# Patient Record
Sex: Male | Born: 1945 | Hispanic: No | Marital: Married | Smoking: Former smoker
Health system: Southern US, Community
[De-identification: ages and names within clinical notes are randomized; demographics above are authoritative.]

## PROBLEM LIST (undated history)

## (undated) DIAGNOSIS — L409 Psoriasis, unspecified: Secondary | ICD-10-CM

## (undated) DIAGNOSIS — I10 Essential (primary) hypertension: Secondary | ICD-10-CM

## (undated) DIAGNOSIS — E039 Hypothyroidism, unspecified: Secondary | ICD-10-CM

## (undated) DIAGNOSIS — J45909 Unspecified asthma, uncomplicated: Secondary | ICD-10-CM

## (undated) DIAGNOSIS — E119 Type 2 diabetes mellitus without complications: Secondary | ICD-10-CM

## (undated) DIAGNOSIS — T7840XA Allergy, unspecified, initial encounter: Secondary | ICD-10-CM

## (undated) DIAGNOSIS — J40 Bronchitis, not specified as acute or chronic: Secondary | ICD-10-CM

## (undated) HISTORY — DX: Allergy, unspecified, initial encounter: T78.40XA

## (undated) HISTORY — DX: Psoriasis, unspecified: L40.9

## (undated) HISTORY — DX: Unspecified asthma, uncomplicated: J45.909

## (undated) HISTORY — PX: TRANSURETHRAL RESECTION OF PROSTATE: SHX73

## (undated) HISTORY — DX: Bronchitis, not specified as acute or chronic: J40

## (undated) HISTORY — PX: CORONARY STENT PLACEMENT: SHX1402

---
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
## 2019-10-04 DIAGNOSIS — R69 Illness, unspecified: Secondary | ICD-10-CM | POA: Diagnosis not present

## 2019-10-09 ENCOUNTER — Ambulatory Visit (HOSPITAL_COMMUNITY)
Admission: RE | Admit: 2019-10-09 | Discharge: 2019-10-09 | Disposition: A | Discharge status: Home / Self Care | Payer: Medicare HMO | Source: Ambulatory Visit | Attending: Internal Medicine | Admitting: Internal Medicine

## 2019-10-09 ENCOUNTER — Ambulatory Visit (INDEPENDENT_AMBULATORY_CARE_PROVIDER_SITE_OTHER): Payer: Medicare HMO | Admitting: Internal Medicine

## 2019-10-09 ENCOUNTER — Other Ambulatory Visit: Payer: Self-pay

## 2019-10-09 ENCOUNTER — Encounter: Payer: Self-pay | Admitting: Internal Medicine

## 2019-10-09 VITALS — BP 106/65 | HR 98 | Temp 98.3°F | Ht 66.0 in | Wt 174.7 lb

## 2019-10-09 DIAGNOSIS — Z79899 Other long term (current) drug therapy: Secondary | ICD-10-CM

## 2019-10-09 DIAGNOSIS — Z6828 Body mass index (BMI) 28.0-28.9, adult: Secondary | ICD-10-CM

## 2019-10-09 DIAGNOSIS — Z7902 Long term (current) use of antithrombotics/antiplatelets: Secondary | ICD-10-CM

## 2019-10-09 DIAGNOSIS — I10 Essential (primary) hypertension: Secondary | ICD-10-CM | POA: Insufficient documentation

## 2019-10-09 DIAGNOSIS — J849 Interstitial pulmonary disease, unspecified: Secondary | ICD-10-CM

## 2019-10-09 DIAGNOSIS — I2510 Atherosclerotic heart disease of native coronary artery without angina pectoris: Secondary | ICD-10-CM | POA: Diagnosis not present

## 2019-10-09 DIAGNOSIS — R634 Abnormal weight loss: Secondary | ICD-10-CM

## 2019-10-09 DIAGNOSIS — H9190 Unspecified hearing loss, unspecified ear: Secondary | ICD-10-CM

## 2019-10-09 DIAGNOSIS — E039 Hypothyroidism, unspecified: Secondary | ICD-10-CM

## 2019-10-09 DIAGNOSIS — Z79890 Hormone replacement therapy (postmenopausal): Secondary | ICD-10-CM

## 2019-10-09 DIAGNOSIS — R918 Other nonspecific abnormal finding of lung field: Secondary | ICD-10-CM | POA: Diagnosis not present

## 2019-10-09 DIAGNOSIS — Z9861 Coronary angioplasty status: Secondary | ICD-10-CM

## 2019-10-09 DIAGNOSIS — L308 Other specified dermatitis: Secondary | ICD-10-CM | POA: Diagnosis not present

## 2019-10-09 DIAGNOSIS — Z7984 Long term (current) use of oral hypoglycemic drugs: Secondary | ICD-10-CM

## 2019-10-09 DIAGNOSIS — E119 Type 2 diabetes mellitus without complications: Secondary | ICD-10-CM

## 2019-10-09 DIAGNOSIS — R9389 Abnormal findings on diagnostic imaging of other specified body structures: Secondary | ICD-10-CM

## 2019-10-09 DIAGNOSIS — R05 Cough: Secondary | ICD-10-CM | POA: Diagnosis not present

## 2019-10-09 DIAGNOSIS — Z7982 Long term (current) use of aspirin: Secondary | ICD-10-CM

## 2019-10-09 DIAGNOSIS — K5900 Constipation, unspecified: Secondary | ICD-10-CM | POA: Diagnosis not present

## 2019-10-09 DIAGNOSIS — R69 Illness, unspecified: Secondary | ICD-10-CM | POA: Diagnosis not present

## 2019-10-09 DIAGNOSIS — F329 Major depressive disorder, single episode, unspecified: Secondary | ICD-10-CM

## 2019-10-09 DIAGNOSIS — Z87891 Personal history of nicotine dependence: Secondary | ICD-10-CM

## 2019-10-09 DIAGNOSIS — K5909 Other constipation: Secondary | ICD-10-CM

## 2019-10-09 HISTORY — DX: Type 2 diabetes mellitus without complications: E11.9

## 2019-10-09 HISTORY — DX: Essential (primary) hypertension: I10

## 2019-10-09 HISTORY — DX: Hypothyroidism, unspecified: E03.9

## 2019-10-09 LAB — GLUCOSE, CAPILLARY: Glucose-Capillary: 95 mg/dL (ref 70–99)

## 2019-10-09 LAB — POCT GLYCOSYLATED HEMOGLOBIN (HGB A1C): Hemoglobin A1C: 6 % — AB (ref 4.0–5.6)

## 2019-10-09 NOTE — Assessment & Plan Note (Signed)
HPI: He struggles with psoriasis he has been using clobetasol cream but it is somewhat limited in his affect he notes diffuse psoriatic rash of arms thighs and some on his chest.  He does have some mild joint pain notably in his bilateral elbows and hips.  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

## 2019-10-09 NOTE — Assessment & Plan Note (Signed)
HPI: Denies any chest pain adherent to medications.  Assessment coronary disease status post PCI  Plan continue Crestor 20 mg daily continue metoprolol continue aspirin 81 mg and Plavix 75 mg daily

## 2019-10-09 NOTE — Assessment & Plan Note (Signed)
HPI: No issues reports adherence to medications.  Assessment essential hypertension well-controlled  Plan Continue current medications Telmisartan 40 mg daily Metoprolol 50 mg daily

## 2019-10-09 NOTE — Assessment & Plan Note (Signed)
HPI: He reports that over the past year he has had about a 10 pound / 5 kg weight loss.  This weight loss has been unintentional he thinks it he may have a slightly decreased appetite but he has not noticed anything specific that has led to his weight loss.  He has not had any recent changes to his health status he has not had any recent medication changes.  His Synthroid dose was last adjusted about 2 years ago he has been on the same diabetic medications for about the last 5 to 7 years.  His cardiac stents were placed about 6 years ago.  He admits to some mild depressive symptoms but he attributes this to the coronavirus pandemic and the [place] lockdown in [place]. He does admit a nonproductive morning cough.   Assessment unintentional weight loss  Plan His diabetes remains well controlled with an A1c of 6.  I will also check a TSH to evaluate his thyroid status but I do not suspect hyperthyroidism.  Given no clear cause on history or exam we will obtain a broad work-up with chest x-ray, UA, CMP CBC ESR CRP HIV and hepatitis C.

## 2019-10-09 NOTE — Assessment & Plan Note (Signed)
HPI: No issues reports that he has been adherent to Metformin and DPP 4 (vildagliptin), no polyuria polydipsia.  No recent changes to his diabetic medications.  Assessment controlled type 2 diabetes mellitus  Plan Continue current treatment plan.

## 2019-10-09 NOTE — Progress Notes (Signed)
Subjective:  HPI: [name] is a 73 y.o. male who presents for evaluation of weight loss, diabetes, hypothyroidism.  Please see Assessment and Plan below for the status of his chronic medical problems. Past Medical History:  Diagnosis Date  . Bronchitis   . Controlled type 2 diabetes mellitus ([place]) 10/09/2019  . Essential hypertension 10/09/2019  . Hypothyroidism 10/09/2019  . Psoriasis    Past Surgical History:  Procedure Laterality Date  . TRANSURETHRAL RESECTION OF PROSTATE  ~2010   Social History   Socioeconomic History  . Marital status: Married    Spouse name: Not on file  . Number of children: Not on file  . Years of education: Not on file  . Highest education level: Not on file  Occupational History  . Not on file  Social Needs  . Financial resource strain: Not on file  . Food insecurity    Worry: Not on file    Inability: Not on file  . Transportation needs    Medical: Not on file    Non-medical: Not on file  Tobacco Use  . Smoking status: Former Research scientist (life sciences)  . Smokeless tobacco: Never Used  Substance and Sexual Activity  . Alcohol use: Yes    Comment: Daily.  . Drug use: Never  . Sexual activity: Not on file  Lifestyle  . Physical activity    Days per week: Not on file    Minutes per session: Not on file  . Stress: Not on file  Relationships  . Social Herbalist on phone: Not on file    Gets together: Not on file    Attends religious service: Not on file    Active member of club or organization: Not on file    Attends meetings of clubs or organizations: Not on file    Relationship status: Not on file  . Intimate partner violence    Fear of current or ex partner: Not on file    Emotionally abused: Not on file    Physically abused: Not on file    Forced sexual activity: Not on file  Other Topics Concern  . Not on file  Social History Narrative  . Not on file    Review of Systems: Review of Systems  Constitutional: Positive  for weight loss. Negative for chills, fever and malaise/fatigue.  HENT: Positive for hearing loss (chronic). Negative for nosebleeds and tinnitus.   Eyes: Negative for blurred vision.  Respiratory: Positive for cough. Negative for shortness of breath.   Cardiovascular: Negative for chest pain and leg swelling.  Gastrointestinal: Positive for constipation (takes bisacodyl). Negative for heartburn.  Genitourinary: Negative for dysuria, frequency and urgency.  Musculoskeletal: Positive for joint pain. Negative for back pain and neck pain.  Skin: Positive for rash (psorasis).  Neurological: Positive for weakness. Negative for headaches.  Endo/Heme/Allergies: Does not bruise/bleed easily.  Psychiatric/Behavioral: Positive for depression (just due to covid 19 pandemic). The patient does not have insomnia.     Objective:  Physical Exam: Vitals:   10/09/19 1131  BP: 106/65  Pulse: 98  Temp: 98.3 F (36.8 C)  TempSrc: Oral  SpO2: 98%  Weight: 174 lb 11.2 oz (79.2 kg)  Height: _0  (1.676 m)   Body mass index is 28.2 kg/m. Physical Exam Vitals signs and nursing note reviewed.  Constitutional:      Appearance: Normal appearance.     Comments: Minimal muscle wasting of temporal region  HENT:     Mouth/Throat:  Mouth: Mucous membranes are moist.     Pharynx: Oropharynx is clear.  Eyes:     Conjunctiva/sclera: Conjunctivae normal.  Cardiovascular:     Rate and Rhythm: Normal rate and regular rhythm.     Pulses: Normal pulses.  Pulmonary:     Effort: Pulmonary effort is normal.     Breath sounds: Normal breath sounds. No wheezing, rhonchi or rales.  Abdominal:     General: Abdomen is flat. Bowel sounds are normal. There is no distension.     Palpations: Abdomen is soft. There is no mass.  Musculoskeletal:        General: No swelling.  Skin:    Findings: Rash present.     Comments: Diffuse scaly plaques c/w psorasis over extensor surfaces of arms and elbows, some papular  lesions over upper body Nails with pitting  Neurological:     Mental Status: He is alert.     Motor: No weakness.    Assessment & Plan:  See Encounters Tab for problem based charting.  Medications Ordered No orders of the defined types were placed in this encounter.  Other Orders Orders Placed This Encounter  Procedures  . DG Chest 2 View    Standing Status:   Future    Number of Occurrences:   1    Standing Expiration Date:   12/07/2020    Order Specific Question:   Reason for Exam (SYMPTOM  OR DIAGNOSIS REQUIRED)    Answer:   unintentional weight loss, 10 lbs in last year, chronic morning cough non productive    Order Specific Question:   Preferred imaging location?    Answer:   [facility]    Order Specific Question:   Radiology Contrast Protocol - do NOT remove file path    Answer:   [email or web address]  . CBC with Diff  . CMP14 + Anion Gap  . TSH  . Sed Rate (ESR)  . CRP (C-Reactive Protein)  . Urinalysis, Reflex Microscopic  . Hepatitis C antibody  . HIV antibody (with reflex)  . Glucose, capillary  . Ambulatory referral to Dermatology    Referral Priority:   Routine    Referral Type:   Consultation    Referral Reason:   Specialty Services Required    Referred to Provider:   Alfonso Patten, MD    Requested Specialty:   Dermatology    Number of Visits Requested:   1  . POC Hbg A1C   Follow Up: Return if symptoms worsen or fail to improve.

## 2019-10-09 NOTE — Assessment & Plan Note (Addendum)
HPI: As noted he has had some unintentional weight loss admits to some mild depression which he attributes to the coronavirus lockdown in [place] and inability to get out.  Otherwise he has had no changes.  His last TSH was 6 in March 2019 coronavirus pandemic has interrupted his regular follow-up.  He believes his levothyroxine dose was last changed about 2 years ago.  Assessment hypothyroidism  Plan Check TSH adjust Synthroid accordingly  ADDENDUM: TSH 5.7 will increase levothyroxine to 9mcg daily

## 2019-10-10 DIAGNOSIS — R9389 Abnormal findings on diagnostic imaging of other specified body structures: Secondary | ICD-10-CM | POA: Insufficient documentation

## 2019-10-10 LAB — CBC WITH DIFFERENTIAL/PLATELET
Basophils Absolute: 0.1 10*3/uL (ref 0.0–0.2)
Basos: 1 %
EOS (ABSOLUTE): 0.6 10*3/uL — ABNORMAL HIGH (ref 0.0–0.4)
Eos: 6 %
Hematocrit: 47 % (ref 37.5–51.0)
Hemoglobin: 15.8 g/dL (ref 13.0–17.7)
Immature Grans (Abs): 0 10*3/uL (ref 0.0–0.1)
Immature Granulocytes: 0 %
Lymphocytes Absolute: 2 10*3/uL (ref 0.7–3.1)
Lymphs: 19 %
MCH: 32.4 pg (ref 26.6–33.0)
MCHC: 33.6 g/dL (ref 31.5–35.7)
MCV: 97 fL (ref 79–97)
Monocytes Absolute: 1 10*3/uL — ABNORMAL HIGH (ref 0.1–0.9)
Monocytes: 9 %
Neutrophils Absolute: 6.8 10*3/uL (ref 1.4–7.0)
Neutrophils: 65 %
Platelets: 233 10*3/uL (ref 150–450)
RBC: 4.87 x10E6/uL (ref 4.14–5.80)
RDW: 12.6 % (ref 11.6–15.4)
WBC: 10.4 10*3/uL (ref 3.4–10.8)

## 2019-10-10 LAB — CMP14 + ANION GAP
ALT: 17 IU/L (ref 0–44)
AST: 21 IU/L (ref 0–40)
Albumin/Globulin Ratio: 1.6 (ref 1.2–2.2)
Albumin: 4.3 g/dL (ref 3.7–4.7)
Alkaline Phosphatase: 45 IU/L (ref 39–117)
Anion Gap: 17 mmol/L (ref 10.0–18.0)
BUN/Creatinine Ratio: 12 (ref 10–24)
BUN: 11 mg/dL (ref 8–27)
Bilirubin Total: 0.8 mg/dL (ref 0.0–1.2)
CO2: 19 mmol/L — ABNORMAL LOW (ref 20–29)
Calcium: 9.6 mg/dL (ref 8.6–10.2)
Chloride: 98 mmol/L (ref 96–106)
Creatinine, Ser: 0.93 mg/dL (ref 0.76–1.27)
GFR calc Af Amer: 94 mL/min/{1.73_m2} (ref >59)
GFR calc non Af Amer: 81 mL/min/{1.73_m2} (ref >59)
Globulin, Total: 2.7 g/dL (ref 1.5–4.5)
Glucose: 85 mg/dL (ref 65–99)
Potassium: 4.3 mmol/L (ref 3.5–5.2)
Sodium: 134 mmol/L (ref 134–144)
Total Protein: 7 g/dL (ref 6.0–8.5)

## 2019-10-10 LAB — URINALYSIS, ROUTINE W REFLEX MICROSCOPIC
Bilirubin, UA: NEGATIVE
Glucose, UA: NEGATIVE
Ketones, UA: NEGATIVE
Leukocytes,UA: NEGATIVE
Nitrite, UA: NEGATIVE
Protein,UA: NEGATIVE
RBC, UA: NEGATIVE
Specific Gravity, UA: <=1.005 — AB (ref 1.005–1.030)
Urobilinogen, Ur: 0.2 mg/dL (ref 0.2–1.0)
pH, UA: 5 (ref 5.0–7.5)

## 2019-10-10 LAB — SEDIMENTATION RATE: Sed Rate: 29 mm/hr (ref 0–30)

## 2019-10-10 LAB — TSH: TSH: 5.77 u[IU]/mL — ABNORMAL HIGH (ref 0.450–4.500)

## 2019-10-10 LAB — C-REACTIVE PROTEIN: CRP: <1 mg/L (ref 0–10)

## 2019-10-10 LAB — HIV ANTIBODY (ROUTINE TESTING W REFLEX): HIV Screen 4th Generation wRfx: NONREACTIVE

## 2019-10-10 LAB — HEPATITIS C ANTIBODY: Hep C Virus Ab: <0.1 s/co ratio (ref 0.0–0.9)

## 2019-10-10 MED ORDER — LEVOTHYROXINE SODIUM 75 MCG PO TABS: 75.0000 ug | ORAL_TABLET | Freq: Every day | ORAL | 3 refills | Status: DC

## 2019-10-10 NOTE — Addendum Note (Signed)
Addended by: Joni Reining C on: 10/10/2019 12:55 PM   Modules accepted: Orders

## 2019-10-10 NOTE — Assessment & Plan Note (Signed)
ADDENDUM: CXR with bilateral basilar and peripheral abnormalites concerning for ILD, will obtain high res ct of chest for further evaluation.

## 2019-11-10 ENCOUNTER — Other Ambulatory Visit: Payer: Self-pay | Admitting: Internal Medicine

## 2019-11-10 MED ORDER — METFORMIN HCL 1000 MG PO TABS: 1000.0000 mg | ORAL_TABLET | Freq: Every day | ORAL | 1 refills | Status: DC

## 2019-11-10 MED ORDER — SITAGLIPTIN PHOSPHATE 100 MG PO TABS: 100.0000 mg | ORAL_TABLET | Freq: Every day | ORAL | 1 refills | Status: DC

## 2019-11-10 NOTE — Progress Notes (Addendum)
Running out of vildagliptin needs refill, this DPP4i is not approved in the [place] so will change over to [place] 100mg  daily.  ADDENDUM: cost is 394 for 90 day supply, given his last A1c is 6.0 would be reasonable to try off DPP4i with just metformin alone.  Alternatively if needed we could try alogliptin which may be better covered by insurance.

## 2019-11-10 NOTE — Addendum Note (Signed)
Addended by: [name] on: 11/10/2019 03:49 PM   Modules accepted: Orders

## 2019-11-17 ENCOUNTER — Other Ambulatory Visit: Payer: Self-pay | Admitting: Internal Medicine

## 2019-11-17 MED ORDER — TELMISARTAN 40 MG PO TABS: 40.0000 mg | ORAL_TABLET | Freq: Every day | ORAL | 1 refills | Status: DC

## 2019-11-17 MED ORDER — CLOPIDOGREL BISULFATE 75 MG PO TABS: 75.0000 mg | ORAL_TABLET | Freq: Every day | ORAL | 1 refills | Status: DC

## 2019-11-17 MED ORDER — BUDESONIDE-FORMOTEROL FUMARATE 80-4.5 MCG/ACT IN AERO: 2.0000 | INHALATION_SPRAY | Freq: Two times a day (BID) | RESPIRATORY_TRACT | 3 refills | Status: DC

## 2019-11-17 MED ORDER — METOPROLOL SUCCINATE ER 50 MG PO TB24: 50.0000 mg | ORAL_TABLET | Freq: Every day | ORAL | 1 refills | Status: DC

## 2019-11-17 MED ORDER — ROSUVASTATIN CALCIUM 20 MG PO TABS: 20.0000 mg | ORAL_TABLET | Freq: Every day | ORAL | 1 refills | Status: DC

## 2019-11-17 NOTE — Progress Notes (Signed)
Medication refill request.

## 2019-11-19 ENCOUNTER — Encounter (HOSPITAL_COMMUNITY): Payer: Self-pay

## 2019-11-19 ENCOUNTER — Ambulatory Visit (HOSPITAL_COMMUNITY)
Admission: RE | Admit: 2019-11-19 | Discharge: 2019-11-19 | Disposition: A | Discharge status: Home / Self Care | Payer: Medicare HMO | Source: Ambulatory Visit | Attending: Internal Medicine | Admitting: Internal Medicine

## 2019-11-19 ENCOUNTER — Other Ambulatory Visit: Payer: Self-pay

## 2019-11-19 DIAGNOSIS — R911 Solitary pulmonary nodule: Secondary | ICD-10-CM | POA: Diagnosis not present

## 2019-11-19 DIAGNOSIS — I700 Atherosclerosis of aorta: Secondary | ICD-10-CM | POA: Diagnosis not present

## 2019-11-19 DIAGNOSIS — J849 Interstitial pulmonary disease, unspecified: Secondary | ICD-10-CM | POA: Diagnosis not present

## 2019-11-19 DIAGNOSIS — J8410 Pulmonary fibrosis, unspecified: Secondary | ICD-10-CM | POA: Diagnosis not present

## 2019-11-19 DIAGNOSIS — R9389 Abnormal findings on diagnostic imaging of other specified body structures: Secondary | ICD-10-CM | POA: Diagnosis not present

## 2019-11-20 ENCOUNTER — Telehealth: Payer: Self-pay | Admitting: Internal Medicine

## 2019-11-20 DIAGNOSIS — J84112 Idiopathic pulmonary fibrosis: Secondary | ICD-10-CM

## 2019-11-20 DIAGNOSIS — B356 Tinea cruris: Secondary | ICD-10-CM | POA: Diagnosis not present

## 2019-11-20 DIAGNOSIS — B354 Tinea corporis: Secondary | ICD-10-CM | POA: Diagnosis not present

## 2019-11-20 DIAGNOSIS — K8020 Calculus of gallbladder without cholecystitis without obstruction: Secondary | ICD-10-CM | POA: Insufficient documentation

## 2019-11-20 DIAGNOSIS — I700 Atherosclerosis of aorta: Secondary | ICD-10-CM | POA: Insufficient documentation

## 2019-11-20 DIAGNOSIS — L408 Other psoriasis: Secondary | ICD-10-CM | POA: Diagnosis not present

## 2019-11-20 DIAGNOSIS — L4052 Psoriatic arthritis mutilans: Secondary | ICD-10-CM | POA: Diagnosis not present

## 2019-11-20 NOTE — Telephone Encounter (Signed)
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  Discussed next steps>> referral to pulmonology likely dr Chase Caller and pulmonary fibrosis clinic.

## 2019-12-02 ENCOUNTER — Other Ambulatory Visit: Payer: Self-pay

## 2019-12-02 ENCOUNTER — Encounter: Payer: Self-pay | Admitting: Pulmonary Disease

## 2019-12-02 ENCOUNTER — Ambulatory Visit: Payer: Medicare HMO | Admitting: Pulmonary Disease

## 2019-12-02 VITALS — BP 118/64 | HR 108 | Temp 97.3°F | Ht 66.0 in | Wt 173.2 lb

## 2019-12-02 DIAGNOSIS — J84112 Idiopathic pulmonary fibrosis: Secondary | ICD-10-CM | POA: Diagnosis not present

## 2019-12-02 NOTE — Progress Notes (Signed)
Nicholas Fleming    [id number]    04-07-1946  Primary Care Physician:Hoffman, Rachel Moulds, DO  Referring Physician: [name], DO [address]  [place],  [place] [address]  Chief complaint:  Consult for pulmonary fibrosis  HPI: 73 year old with history of angina, hypertension, diabetes, hyperlipidemia, psoriasis.  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  Cough is nonproductive in nature associated with mild dyspnea on exertion.  He has history of angina, coronary artery disease with a stent placement many years ago.  He has longstanding psoriasis presenting as rash.  He was on methotrexate for 2 years around 2013 and self discontinued due to concern for side effects.  Recently has been started on [place] with improvement in rash.  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  No mold, hot tub, Jacuzzi, no down pillows or comforter Smoking history: Used to smoke socially.  Quit many years ago Travel history: Originally from [place].  He had lived in [place] for over 30 years and now spends his time between [place] and [place] Relevant family history: No significant family history of lung disease  Outpatient Encounter Medications as of 12/02/2019  Medication Sig  . aspirin EC 81 MG tablet Take 81 mg by mouth daily.  . bisacodyl (DULCOLAX) 5 MG EC tablet Take 5 mg by mouth daily as needed for moderate constipation.  . budesonide-formoterol (SYMBICORT) 80-4.5 MCG/ACT inhaler Inhale 2 puffs into the lungs 2 (two) times daily.  Marland Kitchen CICLOPIROX-CLOBETASOL-SAL ACID EX Apply topically.  . clopidogrel (PLAVIX) 75 MG tablet Take 1 tablet (75 mg total) by mouth daily.  Marland Kitchen levothyroxine (SYNTHROID) 75 MCG tablet Take 1  tablet (75 mcg total) by mouth daily before breakfast.  . metFORMIN (GLUCOPHAGE) 1000 MG tablet Take 1 tablet (1,000 mg total) by mouth daily with breakfast.  . metoprolol succinate (TOPROL-XL) 50 MG 24 hr tablet Take 1 tablet (50 mg total) by mouth daily. Take with or immediately following a meal.  . OTEZLA 30 MG TABS Take 1 tablet by mouth daily.   . rosuvastatin (CRESTOR) 20 MG tablet Take 1 tablet (20 mg total) by mouth daily.  Marland Kitchen telmisartan (MICARDIS) 40 MG tablet Take 1 tablet (40 mg total) by mouth daily.  . [DISCONTINUED] sitaGLIPtin (JANUVIA) 100 MG tablet Take 1 tablet (100 mg total) by mouth daily.   No facility-administered encounter medications on file as of 12/02/2019.    Allergies as of 12/02/2019  . (No Known Allergies)    Past Medical History:  Diagnosis Date  . Bronchitis   . Controlled type 2 diabetes mellitus ([place]) 10/09/2019  . Essential hypertension 10/09/2019  . Hypothyroidism 10/09/2019  . Psoriasis     Past Surgical History:  Procedure Laterality Date  . TRANSURETHRAL RESECTION OF PROSTATE  ~2010    Family History  Problem Relation Age of Onset  . Hypertension Mother   . CAD Brother     Social History   Socioeconomic History  . Marital status: Married    Spouse name: Not on file  . Number of children: Not on file  . Years of education: Not on file  . Highest education level: Not on file  Occupational History  .  Not on file  Tobacco Use  . Smoking status: Never Smoker  . Smokeless tobacco: Never Used  Substance and Sexual Activity  . Alcohol use: Yes    Comment: Daily.  . Drug use: Never  . Sexual activity: Not on file  Other Topics Concern  . Not on file  Social History Narrative  . Not on file   Social Determinants of Health   Financial Resource Strain:   . Difficulty of Paying Living Expenses: Not on file  Food Insecurity:   . Worried About Charity fundraiser in the Last Year: Not on file  . Ran Out of Food in the Last Year:  Not on file  Transportation Needs:   . Lack of Transportation (Medical): Not on file  . Lack of Transportation (Non-Medical): Not on file  Physical Activity:   . Days of Exercise per Week: Not on file  . Minutes of Exercise per Session: Not on file  Stress:   . Feeling of Stress : Not on file  Social Connections:   . Frequency of Communication with Friends and Family: Not on file  . Frequency of Social Gatherings with Friends and Family: Not on file  . Attends Religious Services: Not on file  . Active Member of Clubs or Organizations: Not on file  . Attends Archivist Meetings: Not on file  . Marital Status: Not on file  Intimate Partner Violence:   . Fear of Current or Ex-Partner: Not on file  . Emotionally Abused: Not on file  . Physically Abused: Not on file  . Sexually Abused: Not on file    Review of systems: Review of Systems  Constitutional: Negative for fever and chills.  HENT: Negative.   Eyes: Negative for blurred vision.  Respiratory: as per HPI  Cardiovascular: Negative for chest pain and palpitations.  Gastrointestinal: Negative for vomiting, diarrhea, blood per rectum. Genitourinary: Negative for dysuria, urgency, frequency and hematuria.  Musculoskeletal: Negative for myalgias, back pain and joint pain.  Skin: Negative for itching and rash.  Neurological: Negative for dizziness, tremors, focal weakness, seizures and loss of consciousness.  Endo/Heme/Allergies: Negative for environmental allergies.  Psychiatric/Behavioral: Negative for depression, suicidal ideas and hallucinations.  All other systems reviewed and are negative.  Physical Exam: Blood pressure 118/64, pulse (!) 108, temperature (!) 97.3 F (36.3 C), temperature source Temporal, height 5\' 6"  (1.676 m), weight 173 lb 3.2 oz (78.6 kg), SpO2 99 %. Gen:      No acute distress HEENT:  EOMI, sclera anicteric Neck:     No masses; no thyromegaly Lungs:    Bibasal crackles CV:         Regular  rate and rhythm; no murmurs Abd:      + bowel sounds; soft, non-tender; no palpable masses, no distension Ext:    No edema; adequate peripheral perfusion Skin:      Warm and dry; no rash Neuro: alert and oriented x 3 Psych: normal mood and affect  Data Reviewed: Imaging: High-res CT 11/19/2019-peripheral and basal subpleural reticulation with groundglass, traction bronchiectasis.  Probable honeycombing.  5 mm lung nodule.  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  There is possible honeycombing at the base No known exposures or signs and symptoms of connective tissue disease We will check serologies for ILD Schedule pulmonary function test and 6-minute walk test  Follow-up in 2 weeks and review results.  Based on the labs we may initiate antifibrotic. We will discuss at multidisciplinary ILD conference  Plan/Recommendations: - Serologies for ILD - PFTs, 6-minute walk  Marshell Garfinkel MD [facility] 12/02/2019, 11:30 AM  CC: [name], DO

## 2019-12-02 NOTE — Patient Instructions (Signed)
I have reviewed your CT scan findings with pulmonary fibrosis We will get some lab test today for further work-up and schedule a 6-minute walk test and pulmonary function test Follow-up in 2 weeks for review of tests and plan for next steps

## 2019-12-04 LAB — SJOGREN'S SYNDROME ANTIBODS(SSA + SSB)
SSA (Ro) (ENA) Antibody, IgG: <1 AI
SSB (La) (ENA) Antibody, IgG: <1 AI

## 2019-12-04 LAB — ANA,IFA RA DIAG PNL W/RFLX TIT/PATN
Anti Nuclear Antibody (ANA): NEGATIVE
Cyclic Citrullin Peptide Ab: <16 UNITS
Rheumatoid fact SerPl-aCnc: <14 IU/mL (ref <14)

## 2019-12-04 LAB — ANTI-SCLERODERMA ANTIBODY: Scleroderma (Scl-70) (ENA) Antibody, IgG: <1 AI

## 2019-12-04 LAB — ANCA SCREEN W REFLEX TITER: ANCA Screen: NEGATIVE

## 2019-12-05 LAB — HYPERSENSITIVITY PNEUMONITIS
A. Pullulans Abs: NEGATIVE
A.Fumigatus #1 Abs: NEGATIVE
Micropolyspora faeni, IgG: NEGATIVE
Pigeon Serum Abs: NEGATIVE
Thermoact. Saccharii: NEGATIVE
Thermoactinomyces vulgaris, IgG: NEGATIVE

## 2019-12-14 ENCOUNTER — Telehealth: Payer: Self-pay | Admitting: Unknown Physician Specialty

## 2019-12-14 ENCOUNTER — Other Ambulatory Visit: Payer: Self-pay | Admitting: Unknown Physician Specialty

## 2019-12-14 DIAGNOSIS — U071 COVID-19: Secondary | ICD-10-CM

## 2019-12-14 NOTE — Telephone Encounter (Signed)
  I connected by phone with Nicholas Fleming on 12/14/2019 at 10:53 AM to discuss the potential use of an new treatment for mild to moderate COVID-19 viral infection in non-hospitalized patients.  This patient is a 73 y.o. male that meets the FDA criteria for Emergency Use Authorization of bamlanivimab or casirivimab\imdevimab.  Has a (+) direct SARS-CoV-2 viral test result  Has mild or moderate COVID-19   Is ? 74 years of age and weighs ? 40 kg  Is NOT hospitalized due to COVID-19  Is NOT requiring oxygen therapy or requiring an increase in baseline oxygen flow rate due to COVID-19  Is within 10 days of symptom onset  Has at least one of the high risk factor(s) for progression to severe COVID-19 and/or hospitalization as defined in EUA.  Specific high risk criteria : >/= 74 yo   I have spoken and communicated the following to the patient or parent/caregiver:  1. FDA has authorized the emergency use of bamlanivimab and casirivimab\imdevimab for the treatment of mild to moderate COVID-19 in adults and pediatric patients with positive results of direct SARS-CoV-2 viral testing who are 89 years of age and older weighing at least 40 kg, and who are at high risk for progressing to severe COVID-19 and/or hospitalization.  2. The significant known and potential risks and benefits of bamlanivimab and casirivimab\imdevimab, and the extent to which such potential risks and benefits are unknown.  3. Information on available alternative treatments and the risks and benefits of those alternatives, including clinical trials.  4. Patients treated with bamlanivimab and casirivimab\imdevimab should continue to self-isolate and use infection control measures (e.g., wear mask, isolate, social distance, avoid sharing personal items, clean and disinfect "high touch" surfaces, and frequent handwashing) according to CDC guidelines.   5. The patient or parent/caregiver has the option to accept or refuse  bamlanivimab or casirivimab\imdevimab .  After reviewing this information with the patient, The patient agreed to proceed with receiving the casirivimab\imdevimab infusion and will be provided a copy of the Fact sheet prior to receiving the infusion.Kathrine Haddock 12/14/2019 10:53 AM  Symptom onset 12/28

## 2019-12-16 ENCOUNTER — Ambulatory Visit (HOSPITAL_COMMUNITY)
Admission: RE | Admit: 2019-12-16 | Discharge: 2019-12-16 | Disposition: A | Discharge status: Home / Self Care | Payer: Medicare Other | Source: Ambulatory Visit | Attending: Pulmonary Disease | Admitting: Pulmonary Disease

## 2019-12-16 ENCOUNTER — Ambulatory Visit: Payer: Medicare HMO

## 2019-12-16 DIAGNOSIS — Z23 Encounter for immunization: Secondary | ICD-10-CM | POA: Insufficient documentation

## 2019-12-16 DIAGNOSIS — U071 COVID-19: Secondary | ICD-10-CM | POA: Diagnosis not present

## 2019-12-16 MED ORDER — SODIUM CHLORIDE 0.9 % IV SOLN
INTRAVENOUS | Status: DC | PRN
  Administered 2019-12-16: 250 mL via INTRAVENOUS

## 2019-12-16 MED ORDER — ALBUTEROL SULFATE HFA 108 (90 BASE) MCG/ACT IN AERS: 2.0000 | INHALATION_SPRAY | Freq: Once | RESPIRATORY_TRACT | Status: DC | PRN

## 2019-12-16 MED ORDER — FAMOTIDINE IN NACL 20-0.9 MG/50ML-% IV SOLN: 20.0000 mg | Freq: Once | INTRAVENOUS | Status: DC | PRN

## 2019-12-16 MED ORDER — DIPHENHYDRAMINE HCL 50 MG/ML IJ SOLN: 50.0000 mg | Freq: Once | INTRAMUSCULAR | Status: DC | PRN

## 2019-12-16 MED ORDER — EPINEPHRINE 0.3 MG/0.3ML IJ SOAJ: 0.3000 mg | Freq: Once | INTRAMUSCULAR | Status: DC | PRN

## 2019-12-16 MED ORDER — METHYLPREDNISOLONE SODIUM SUCC 125 MG IJ SOLR: 125.0000 mg | Freq: Once | INTRAMUSCULAR | Status: DC | PRN

## 2019-12-16 MED ORDER — SODIUM CHLORIDE 0.9 % IV SOLN
Freq: Once | INTRAVENOUS | Status: AC
  Filled 2019-12-16: qty 10

## 2019-12-16 NOTE — Progress Notes (Signed)
  Diagnosis: COVID-19  Physician: Dr. Joya Gaskins  Procedure: [facility]fusion [facility] Med: casirivimab\imdevimab infusion - Provided patient with casirivimab\imdevimab fact sheet for patients, parents and caregivers prior to infusion.  Complications: No immediate complications noted.  Discharge: Discharged home   [facility] 12/16/2019

## 2019-12-16 NOTE — Discharge Instructions (Signed)
10 Things You Can Do to Manage Your COVID-19 Symptoms at Home If you have possible or confirmed COVID-19: 1. Stay home from work and school. And stay away from other public places. If you must go out, avoid using any kind of public transportation, ridesharing, or taxis. 2. Monitor your symptoms carefully. If your symptoms get worse, call your healthcare provider immediately. 3. Get rest and stay hydrated. 4. If you have a medical appointment, call the healthcare provider ahead of time and tell them that you have or may have COVID-19. 5. For medical emergencies, call 911 and notify the dispatch personnel that you have or may have COVID-19. 6. Cover your cough and sneezes with a tissue or use the inside of your elbow. 7. Wash your hands often with soap and water for at least 20 seconds or clean your hands with an alcohol-based hand sanitizer that contains at least 60% alcohol. 8. As much as possible, stay in a specific room and away from other people in your home. Also, you should use a separate bathroom, if available. If you need to be around other people in or outside of the home, wear a mask. 9. Avoid sharing personal items with other people in your household, like dishes, towels, and bedding. 10. Clean all surfaces that are touched often, like counters, tabletops, and doorknobs. Use household cleaning sprays or wipes according to the label instructions. cdc.gov/coronavirus 06/18/2019 This information is not intended to replace advice given to you by your health care provider. Make sure you discuss any questions you have with your health care provider. Document Revised: 11/20/2019 Document Reviewed: 11/20/2019 Elsevier Patient Education  2020 Elsevier Inc.  

## 2019-12-16 NOTE — Progress Notes (Signed)
  Diagnosis: COVID-19  Physician: [name], DO  Procedure: [facility] Infusion Clinic Med: casirivimab\imdevimab infusion - Provided patient with casirivimab\imdevimab fact sheet for patients, parents and caregivers prior to infusion.  Complications: No immediate complications noted.  Discharge: Discharged home   Nicholas Fleming 12/16/2019

## 2019-12-24 ENCOUNTER — Ambulatory Visit: Payer: Medicare HMO | Admitting: Pulmonary Disease

## 2019-12-28 LAB — MYOMARKER 3 PLUS PROFILE (RDL)

## 2019-12-30 ENCOUNTER — Ambulatory Visit: Payer: Self-pay | Admitting: Pulmonary Disease

## 2020-01-02 NOTE — Progress Notes (Signed)
   Interstitial Lung Disease Multidisciplinary Conference   Nicholas Fleming    MRN [id number]    DOB [birth year]  Primary Care Physician:Hoffman, Rachel Moulds, DO  Referring Physician: Marshell Garfinkel MD  Time of Conference: 7.30am- 8.30am Date of conference: 12/30/2019 Location of Conference: -  Virtual  Participating Pulmonary: Dr. Brand Males, MD,  Dr Marshell Garfinkel, MD Pathology: Dr Jaquita Folds, MD Radiology: Dr Eddie Candle Others:   Brief History:  74 year old evaluated for CT scan showing pulmonary fibrosis.  No exposure history or signs of connective tissue disease, autoimmune process.  Serology:  Extensive CTD serologies 12/02/2019-negative  MDD discussion of CT scan  High-resolution CT 11/19/2019-bibasilar pattern of traction bronchiectasis, reticulation.  No convincing honeycombing.  Probable UIP pattern  Pathology discussion of biopsy :  PFTs:  Pending  MDD Impression/Recs:  CT scan is in probable UIP pattern by strict ATS criteria with no overt honeycombing.  However it was felt by radiologist and others that the pulmonary fibrosis pattern is convincing enough to be on the borderline between probable to definite UIP.  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  Suggest discussion of risk-benefit with patient about utility of biopsy or BAL.  It would be reasonable to treat this as IPF with antifibrotics without biopsy.  Time Spent in preparation and discussion:  > 30 min    SIGNATURE   Marshell Garfinkel MD  Pulmonary and Critical Care 01/02/2020, 4:48 PM ...................................................................................................................Marland Kitchen References:  Diagnosis of Idiopathic Pulmonary Fibrosis. An Official ATS/ERS/JRS/ALAT Clinical Practice Guideline. Raghu G et al, [place]. 2018 Sep 1;198(5):e44-e68.   IPF Suspected   Histopath ology Pattern      UIP  Probable UIP  Indeterminate for  UIP   Alternative  diagnosis    UIP  IPF  IPF  IPF  Non-IPF dx   HRCT   Probabe UIP  IPF  IPF  IPF (Likely)**  Non-IPF dx  Pattern  Indeterminate for UIP  IPF  IPF (Likely)**  Indeterminate  for IPF**  Non-IPF dx    Alternative diagnosis  IPF (Likely)**/ non-IPF dx  Non-IPF dx  Non-IPF dx  Non-IPF dx     Idiopathic pulmonary fibrosis diagnosis based upon HRCT and Biopsy paterns.  ** IPF is the likely diagnosis when any of following features are present:  . Moderate-to-severe traction bronchiectasis/bronchiolectasis (defined as mild traction bronchiectasis/bronchiolectasis in four or more lobes including the lingual as a lobe, or moderate to severe traction bronchiectasis in two or more lobes) in a man over age 1 years or in a woman over age 53 years . Extensive (>30%) reticulation on HRCT and an age >70 years  . Increased neutrophils and/or absence of lymphocytosis in BAL fluid  . Multidisciplinary discussion reaches a confident diagnosis of IPF.   **Indeterminate for IPF  . Without an adequate biopsy is unlikely to be IPF  . With an adequate biopsy may be reclassified to a more specific diagnosis after multidisciplinary discussion and/or additional consultation.   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

## 2020-01-07 DIAGNOSIS — Z79899 Other long term (current) drug therapy: Secondary | ICD-10-CM | POA: Diagnosis not present

## 2020-01-07 DIAGNOSIS — B354 Tinea corporis: Secondary | ICD-10-CM | POA: Diagnosis not present

## 2020-01-07 DIAGNOSIS — L400 Psoriasis vulgaris: Secondary | ICD-10-CM | POA: Diagnosis not present

## 2020-01-07 DIAGNOSIS — B356 Tinea cruris: Secondary | ICD-10-CM | POA: Diagnosis not present

## 2020-01-09 ENCOUNTER — Ambulatory Visit: Payer: Medicare HMO

## 2020-01-12 ENCOUNTER — Other Ambulatory Visit: Payer: Self-pay

## 2020-01-12 ENCOUNTER — Ambulatory Visit (INDEPENDENT_AMBULATORY_CARE_PROVIDER_SITE_OTHER): Payer: Medicare HMO

## 2020-01-12 ENCOUNTER — Ambulatory Visit (INDEPENDENT_AMBULATORY_CARE_PROVIDER_SITE_OTHER): Payer: Medicare HMO | Admitting: Pulmonary Disease

## 2020-01-12 ENCOUNTER — Encounter: Payer: Self-pay | Admitting: Pulmonary Disease

## 2020-01-12 VITALS — BP 114/60 | HR 90 | Ht 66.0 in | Wt 169.0 lb

## 2020-01-12 DIAGNOSIS — J84112 Idiopathic pulmonary fibrosis: Secondary | ICD-10-CM | POA: Insufficient documentation

## 2020-01-12 NOTE — Progress Notes (Signed)
Subjective:  Patient presents today to [facility] to see pharmacy team for initial Ofev counseling.  Pertinent past medical history includes IPF, CAD, HTN, DM2, . Prior therapy includes: methotrexate (for psoriasis).  Objective: No Known Allergies  Outpatient Encounter Medications as of 01/16/2020  Medication Sig  . aspirin EC 81 MG tablet Take 81 mg by mouth daily.  . bisacodyl (DULCOLAX) 5 MG EC tablet Take 5 mg by mouth daily as needed for moderate constipation.  . budesonide-formoterol (SYMBICORT) 80-4.5 MCG/ACT inhaler Inhale 2 puffs into the lungs 2 (two) times daily.  Marland Kitchen CICLOPIROX-CLOBETASOL-SAL ACID EX Apply topically.  . clopidogrel (PLAVIX) 75 MG tablet Take 1 tablet (75 mg total) by mouth daily.  Marland Kitchen levothyroxine (SYNTHROID) 75 MCG tablet Take 1 tablet (75 mcg total) by mouth daily before breakfast.  . metFORMIN (GLUCOPHAGE) 1000 MG tablet Take 1 tablet (1,000 mg total) by mouth daily with breakfast.  . metoprolol succinate (TOPROL-XL) 50 MG 24 hr tablet Take 1 tablet (50 mg total) by mouth daily. Take with or immediately following a meal.  . OTEZLA 30 MG TABS Take 1 tablet by mouth daily.   . rosuvastatin (CRESTOR) 20 MG tablet Take 1 tablet (20 mg total) by mouth daily.  Marland Kitchen telmisartan (MICARDIS) 40 MG tablet Take 1 tablet (40 mg total) by mouth daily.   No facility-administered encounter medications on file as of 01/16/2020.     Immunization History  Administered Date(s) Administered  . Influenza, Quadrivalent, Recombinant, Inj, Pf 10/04/2019  . Influenza,inj,Quad PF,6+ Mos 10/02/2019     HRCT 11/19/2019-peripheral and basal subpleural reticulation with groundglass, traction bronchiectasis.  Probable honeycombing.  5 mm lung nodule.  Probable UIP pattern.  PFT's No results found for: FEV1, FVC, FEV1FVC, TLC, DLCO   Chest X-ray 10/09/2019- Patchy reticular opacities throughout the peripheral and basilar lungs bilaterally, suggestive of interstitial lung  disease. No acute consolidative airspace disease. Consider high-resolution chest CT study for further evaluation, as clinically warranted.  CMP     Component Value Date/Time   NA 134 10/09/2019 1231   K 4.3 10/09/2019 1231   CL 98 10/09/2019 1231   CO2 19 (L) 10/09/2019 1231   GLUCOSE 85 10/09/2019 1231   BUN 11 10/09/2019 1231   CREATININE 0.93 10/09/2019 1231   CALCIUM 9.6 10/09/2019 1231   PROT 7.0 10/09/2019 1231   ALBUMIN 4.3 10/09/2019 1231   AST 21 10/09/2019 1231   ALT 17 10/09/2019 1231   ALKPHOS 45 10/09/2019 1231   BILITOT 0.8 10/09/2019 1231   GFRNONAA 81 10/09/2019 1231   GFRAA 94 10/09/2019 1231     CBC    Component Value Date/Time   WBC 10.4 10/09/2019 1230   RBC 4.87 10/09/2019 1230   HGB 15.8 10/09/2019 1230   HCT 47.0 10/09/2019 1230   PLT 233 10/09/2019 1230   MCV 97 10/09/2019 1230   MCH 32.4 10/09/2019 1230   MCHC 33.6 10/09/2019 1230   RDW 12.6 10/09/2019 1230   LYMPHSABS 2.0 10/09/2019 1230   EOSABS 0.6 (H) 10/09/2019 1230   BASOSABS 0.1 10/09/2019 1230     LFT's Hepatic Function Latest Ref Rng & Units 10/09/2019  Total Protein 6.0 - 8.5 g/dL 7.0  Albumin 3.7 - 4.7 g/dL 4.3  AST 0 - 40 IU/L 21  ALT 0 - 44 IU/L 17  Alk Phosphatase 39 - 117 IU/L 45  Total Bilirubin 0.0 - 1.2 mg/dL 0.8     Assessment and Plan  1. Ofev Medication Management  Patient counseled  on purpose, proper use, and potential adverse effects including diarrhea, nausea, vomiting, abdominal pain, decreased appetite, weight loss, and increased blood pressure. Stressed importance of routine lab monitoring of LFT's every month for the first 6 months of treatment and then every 3 months thereafter.   Patient verbalized understanding.  Ofev dose will be 150 mg capsule every 12 hours with food. Stressed importance of taking with food to minimize stomach upset.  Patient verbalized understanding.  Patient was scheduled an appointment prior to completing benefits investigation.   Patient has Extra Help and his co-pay should be $9.20 a month.  Will complete benefits investigation to verify co-pay and determine specialty pharmacy.  Patient verbalized understanding.  2. Medication Reconciliation  A drug regimen assessment was performed, including review of allergies, interactions, disease-state management, dosing and immunization history. Medications were reviewed with the patient, including name, instructions, indication, goals of therapy, potential side effects, importance of adherence, and safe use.  No major drug interactions identified.  3. Immunizations  Patient is up to date with annual influenza vaccine.  Recommend pneumonia, Covid-19, and Shingrix.  All questions encouraged and answered.  Instructed patient to call with any other questions or concerns.  Thank you for allowing pharmacy to participate in this patient's care.  This appointment required  25 minutes of patient care (this includes precharting, chart review, review of results, face-to-face care, etc.).  Mariella Saa, PharmD, [place], [place] Clinical Specialty Pharmacist [phone]  01/16/2020 2:41 PM

## 2020-01-12 NOTE — Progress Notes (Signed)
SIX MIN WALK 01/12/2020  Medications Plavix 75mg , Synthroid 75mg , Metformin 1000mg , Metoprolol 50mg , Crestor 20mg  and telmisartan 40mg  were all taken this morning.  Supplimental Oxygen during Test? (L/min) No  Laps 12  Partial Lap (in Meters) 0  Baseline BP (sitting) 114/80  Baseline Heartrate 88  Baseline Dyspnea (Borg Scale) 1  Baseline Fatigue (Borg Scale) 0  Baseline SPO2 94  BP (sitting) 124/86  Heartrate 98  Dyspnea (Borg Scale) 1  Fatigue (Borg Scale) 0  SPO2 92  BP (sitting) 126/80  Heartrate 94  SPO2 97  Stopped or Paused before Six Minutes No  Distance Completed 408  Tech Comments: Patient was able to complete walk at a steady pace. No O2 was needed during or after walk. He denied any SOB, CP or leg pain. Patient did not stop during walk.

## 2020-01-12 NOTE — Patient Instructions (Signed)
We will get you started on Ofev therapy for pulmonary fibrosis We will refer you to pharmacy for education and management of medication Follow-up in 1 month.

## 2020-01-12 NOTE — Progress Notes (Signed)
Nicholas Fleming    [id number]    Jan 16, 1946  Primary Care Physician:Hoffman, Rachel Moulds, DO  Referring Physician: [name], DO [address]  [place],  [place] [address]  Chief complaint: Follow-up for IPF  HPI: 74 year old with history of angina, hypertension, diabetes, hyperlipidemia, psoriasis.  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  Cough is nonproductive in nature associated with mild dyspnea on exertion.  He has history of angina, coronary artery disease with a stent placement many years ago.  He has longstanding psoriasis presenting as rash.  He was on methotrexate for 2 years around 2013 and self discontinued due to concern for side effects.  Recently has been started on [place] with improvement in rash.  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  No mold, hot tub, Jacuzzi, no down pillows or comforter ILD questionnaire 01/12/2020-negative Smoking history: Used to smoke socially.  Quit many years ago Travel history: Originally from [place].  He had lived in [place] for over 30 years and now spends his time between [place] and [place] Relevant family history: No significant family history of lung disease  Interval history: Here for follow-up of pulmonary fibrosis He developed COVID-19 infection at the end of December and treated with monoclonal antibody on 12/29 with improvement in symptoms.  Doing well with regard to breathing.  No complaints today.  Outpatient Encounter Medications as of 01/12/2020  Medication Sig  . aspirin EC 81 MG tablet Take 81 mg by mouth daily.  . bisacodyl (DULCOLAX) 5 MG EC tablet Take 5 mg by mouth daily as needed for moderate constipation.  .  budesonide-formoterol (SYMBICORT) 80-4.5 MCG/ACT inhaler Inhale 2 puffs into the lungs 2 (two) times daily.  Marland Kitchen CICLOPIROX-CLOBETASOL-SAL ACID EX Apply topically.  . clopidogrel (PLAVIX) 75 MG tablet Take 1 tablet (75 mg total) by mouth daily.  Marland Kitchen levothyroxine (SYNTHROID) 75 MCG tablet Take 1 tablet (75 mcg total) by mouth daily before breakfast.  . metFORMIN (GLUCOPHAGE) 1000 MG tablet Take 1 tablet (1,000 mg total) by mouth daily with breakfast.  . metoprolol succinate (TOPROL-XL) 50 MG 24 hr tablet Take 1 tablet (50 mg total) by mouth daily. Take with or immediately following a meal.  . OTEZLA 30 MG TABS Take 1 tablet by mouth daily.   . rosuvastatin (CRESTOR) 20 MG tablet Take 1 tablet (20 mg total) by mouth daily.  Marland Kitchen telmisartan (MICARDIS) 40 MG tablet Take 1 tablet (40 mg total) by mouth daily.   No facility-administered encounter medications on file as of 01/12/2020.   Physical Exam: Blood pressure 114/60, pulse 90, height 5\' 6"  (1.676 m), weight 169 lb (76.7 kg), SpO2 97 %. Gen:      No acute distress HEENT:  EOMI, sclera anicteric Neck:     No masses; no thyromegaly Lungs:    Bibasal crackles CV:         Regular rate and rhythm; no murmurs Abd:      + bowel sounds; soft, non-tender; no palpable masses, no distension Ext:    No edema; adequate peripheral perfusion Skin:      Warm and dry; no rash Neuro: alert and oriented x 3 Psych: normal mood and  affect  Data Reviewed: Imaging: High-res CT 11/19/2019-peripheral and basal subpleural reticulation with groundglass, traction bronchiectasis.  Probable honeycombing.  5 mm lung nodule.  Probable UIP pattern  6-minute walk  Labs: CMP 10/22- -within normal limits CBC 10/22 -WBC 10.4, eos 6%, absolute eosinophil count 624  CTD serologies 12/02/19-negative  Assessment:  Pulmonary fibrosis Reviewed CT in probable UIP pattern.  No known exposures or signs and symptoms of connective tissue disease Serologies for CTD are negative   Reviewed at multidisciplinary conference on 1/12.  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  We had long discussion with patient and son today.  Discussed options for biopsy but we felt that if this is not needed to make a diagnosis as we are fairly certain that this is IPF.  We reviewed the available antifibrotic treatment including pirfenidone, nintedanib and have elected to start nintedanib We will start paperwork for this therapy and refer to pharmacy for management Awaiting PFTs Follow-up in 4 weeks  Plan/Recommendations: - Start Ofev - Pharmacy referral  Marshell Garfinkel MD [facility] 01/12/2020, 11:33 AM  CC: [name], DO

## 2020-01-13 ENCOUNTER — Telehealth: Payer: Self-pay | Admitting: Pulmonary Disease

## 2020-01-13 NOTE — Telephone Encounter (Signed)
Yopp, Marthe Patch, RPH-CPP sent to Rosana Berger, CMA  Caller: Unspecified (Today, 1:50 PM)  Of course! We will be providing in-depth education about the medication, lab monitoring, and assisting with setting them up with their specialty pharmacy. Thanks!   [employer] with the pt's son and notified of recs and he verbalized understanding

## 2020-01-13 NOTE — Telephone Encounter (Signed)
Yes, patient must be present during office visit. You may schedule as 60 minute pharmacy clinic virtual visit if they prefer. Thank you!    Amber- I forgot to ask in previous msg, he had asked what the visit would consist of. Can you please advise a little bit about what they can expect so I can let him know.thanks!

## 2020-01-13 NOTE — Telephone Encounter (Signed)
Spoke with the pt's son  He is asking if he can come to the pt's pharmacy appt for the pt without the pt being there The appt is to discuss ofev  I am almost certain the answer is no, but will need to ask the pharmacy team as I am not 100% sure about this  Please advise thanks

## 2020-01-16 ENCOUNTER — Other Ambulatory Visit: Payer: Self-pay

## 2020-01-16 ENCOUNTER — Telehealth: Payer: Self-pay | Admitting: Internal Medicine

## 2020-01-16 ENCOUNTER — Telehealth: Payer: Self-pay | Admitting: Pharmacist

## 2020-01-16 ENCOUNTER — Ambulatory Visit (INDEPENDENT_AMBULATORY_CARE_PROVIDER_SITE_OTHER): Payer: Medicare HMO | Admitting: Pharmacist

## 2020-01-16 DIAGNOSIS — J84112 Idiopathic pulmonary fibrosis: Secondary | ICD-10-CM

## 2020-01-16 DIAGNOSIS — Z79899 Other long term (current) drug therapy: Secondary | ICD-10-CM

## 2020-01-16 MED ORDER — BUDESONIDE-FORMOTEROL FUMARATE 80-4.5 MCG/ACT IN AERO: 2.0000 | INHALATION_SPRAY | Freq: Two times a day (BID) | RESPIRATORY_TRACT | 3 refills | Status: DC

## 2020-01-16 MED ORDER — ALBUTEROL SULFATE HFA 108 (90 BASE) MCG/ACT IN AERS: 1.0000 | INHALATION_SPRAY | Freq: Four times a day (QID) | RESPIRATORY_TRACT | 3 refills | Status: DC | PRN

## 2020-01-16 MED ORDER — OFEV 150 MG PO CAPS: 150.0000 mg | ORAL_CAPSULE | Freq: Two times a day (BID) | ORAL | 0 refills | Status: DC

## 2020-01-16 NOTE — Telephone Encounter (Signed)
Called to notify of approval, co-pay and specialty pharmacy.  Spoke to son, Nischal who verbalized understanding.  Provided number for [facility] pulmonary department [phone]) and instructed to call Tuesday if they have not contacted patient to schedule shipment.  Mariella Saa, PharmD, [place], [place] Clinical Specialty Pharmacist [phone]  01/16/2020 3:30 PM

## 2020-01-16 NOTE — Telephone Encounter (Signed)
Ran test claim, patient's copay for 1 month supply of Ofev is $9.20

## 2020-01-16 NOTE — Telephone Encounter (Addendum)
Received notification from [facility] regarding a prior authorization for [place]. Authorization has been APPROVED from 12/19/2019 to 12/17/2020.   Will send document to scan center.  Mariella Saa, PharmD, [place], [place] Clinical Specialty Pharmacist [phone]  01/16/2020 3:02 PM

## 2020-01-16 NOTE — Telephone Encounter (Signed)
Requesting refills of albuterol and symbicort., sent

## 2020-01-16 NOTE — Telephone Encounter (Signed)
Submitted a Prior Authorization request to [employer] for [place] via Cover My Meds. Will update once we receive a response.  Key: [id number]  PA Case ID: [id number]  Mariella Saa, PharmD, [place], CPP Clinical Specialty Pharmacist [phone]  01/16/2020 2:35 PM

## 2020-01-16 NOTE — Patient Instructions (Addendum)
   We will call when we determine co-pay and which specialty pharmacy you are required to fill with  Start Ofev when you receive your shipment  Obtain labs at your next follow up appointment with Dr. Vaughan Browner.  Please call with any other questions or concerns!    Mariella Saa, PharmD, [place], [place] Clinical Specialty Pharmacist [phone]  01/16/2020 2:13 PM

## 2020-02-11 ENCOUNTER — Telehealth: Payer: Self-pay | Admitting: Internal Medicine

## 2020-02-11 DIAGNOSIS — R634 Abnormal weight loss: Secondary | ICD-10-CM

## 2020-02-11 NOTE — Telephone Encounter (Signed)
Received call from [place], [place] son.  Reports patient has continued to loose weight since our last visit.  Has lost an additional 10 lbs unintentionally.  He continues to have excelletn glycemic control. No signs of hyperthyroidism, (had elevated TSH at last visit and is upcoming a recheck.)  He has never had colon cancer screening and they are interested in this now, he will also likely need an EGD with his unintentional weight loss.  Will place referral to GI

## 2020-02-12 ENCOUNTER — Encounter: Payer: Self-pay | Admitting: Pulmonary Disease

## 2020-02-12 ENCOUNTER — Other Ambulatory Visit: Payer: Self-pay

## 2020-02-12 ENCOUNTER — Ambulatory Visit: Payer: Medicare HMO | Admitting: Pulmonary Disease

## 2020-02-12 VITALS — BP 120/78 | HR 80 | Temp 97.3°F | Ht 66.0 in | Wt 167.2 lb

## 2020-02-12 DIAGNOSIS — J84112 Idiopathic pulmonary fibrosis: Secondary | ICD-10-CM

## 2020-02-12 DIAGNOSIS — Z5181 Encounter for therapeutic drug level monitoring: Secondary | ICD-10-CM | POA: Diagnosis not present

## 2020-02-12 LAB — CBC WITH DIFFERENTIAL/PLATELET
Basophils Absolute: 0 10*3/uL (ref 0.0–0.1)
Basophils Relative: 0.6 % (ref 0.0–3.0)
Eosinophils Absolute: 0.7 10*3/uL (ref 0.0–0.7)
Eosinophils Relative: 7.8 % — ABNORMAL HIGH (ref 0.0–5.0)
HCT: 45.3 % (ref 39.0–52.0)
Hemoglobin: 15.3 g/dL (ref 13.0–17.0)
Lymphocytes Relative: 22.3 % (ref 12.0–46.0)
Lymphs Abs: 1.9 10*3/uL (ref 0.7–4.0)
MCHC: 33.9 g/dL (ref 30.0–36.0)
MCV: 96.8 fl (ref 78.0–100.0)
Monocytes Absolute: 1 10*3/uL (ref 0.1–1.0)
Monocytes Relative: 11.7 % (ref 3.0–12.0)
Neutro Abs: 4.9 10*3/uL (ref 1.4–7.7)
Neutrophils Relative %: 57.6 % (ref 43.0–77.0)
Platelets: 250 10*3/uL (ref 150.0–400.0)
RBC: 4.67 Mil/uL (ref 4.22–5.81)
RDW: 13.5 % (ref 11.5–15.5)
WBC: 8.5 10*3/uL (ref 4.0–10.5)

## 2020-02-12 LAB — HEPATIC FUNCTION PANEL
ALT: 80 U/L — ABNORMAL HIGH (ref 0–53)
AST: 45 U/L — ABNORMAL HIGH (ref 0–37)
Albumin: 4.1 g/dL (ref 3.5–5.2)
Alkaline Phosphatase: 91 U/L (ref 39–117)
Bilirubin, Direct: 0.2 mg/dL (ref 0.0–0.3)
Total Bilirubin: 0.7 mg/dL (ref 0.2–1.2)
Total Protein: 7.5 g/dL (ref 6.0–8.3)

## 2020-02-12 LAB — TSH: TSH: 1.61 u[IU]/mL (ref 0.35–4.50)

## 2020-02-12 NOTE — Patient Instructions (Signed)
Am glad you are doing well with regard to your breathing and you are tolerating the Ofev well We will check routine labs today for monitoring including CBC, liver panel We will add a TSH as well  Follow-up in clinic in 1 month.

## 2020-02-12 NOTE — Addendum Note (Signed)
Addended by: Suzzanne Cloud E on: 02/12/2020 03:13 PM   Modules accepted: Orders

## 2020-02-12 NOTE — Progress Notes (Signed)
Nicholas Fleming    [id number]    March 26, 1946  Primary Care Physician:Hoffman, Rachel Moulds, DO  Referring Physician: [name], DO [address]  [place],  [place] [address]  Chief complaint: Follow-up for IPF  HPI: 74 year old with history of angina, hypertension, diabetes, hyperlipidemia, psoriasis.  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  Cough is nonproductive in nature associated with mild dyspnea on exertion.  He has history of angina, coronary artery disease with a stent placement many years ago.  He has longstanding psoriasis presenting as rash.  He was on methotrexate for 2 years around 2013 and self discontinued due to concern for side effects.  Recently has been started on [place] with improvement in rash.  He developed COVID-19 infection at the end of December and treated with monoclonal antibody on 12/29 with improvement in symptoms.  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  No mold, hot tub, Jacuzzi, no down pillows or comforter ILD questionnaire 01/12/2020-negative Smoking history: Used to smoke socially.  Quit many years ago Travel history: Originally from [place].  He had lived in [place] for over [place] years and now spends his time between [place] and [place] Relevant family history: No significant family history of lung disease  Interval history: Started on Ofev at the end of January 2021.  He is tolerating it well with no GI issues. Breathing is doing well.  No complaints today  Outpatient Encounter Medications as of 02/12/2020  Medication Sig  . albuterol (PROVENTIL HFA) 108 (90 Base) MCG/ACT inhaler Inhale 1-2 puffs into the lungs every 6 (six) hours as needed for wheezing or  shortness of breath.  Marland Kitchen aspirin EC 81 MG tablet Take 81 mg by mouth daily.  . bisacodyl (DULCOLAX) 5 MG EC tablet Take 5 mg by mouth daily as needed for moderate constipation.  . budesonide-formoterol (SYMBICORT) 80-4.5 MCG/ACT inhaler Inhale 2 puffs into the lungs 2 (two) times daily.  . cholecalciferol (VITAMIN D3) 25 MCG (1000 UNIT) tablet Take 1,000 Units by mouth daily.  Marland Kitchen CICLOPIROX-CLOBETASOL-SAL ACID EX Apply topically.  . clopidogrel (PLAVIX) 75 MG tablet Take 1 tablet (75 mg total) by mouth daily.  Marland Kitchen levothyroxine (SYNTHROID) 75 MCG tablet Take 1 tablet (75 mcg total) by mouth daily before breakfast.  . metFORMIN (GLUCOPHAGE) 1000 MG tablet Take 1 tablet (1,000 mg total) by mouth daily with breakfast.  . metoprolol succinate (TOPROL-XL) 50 MG 24 hr tablet Take 1 tablet (50 mg total) by mouth daily. Take with or immediately following a meal.  . Nintedanib (OFEV) 150 MG CAPS Take 1 capsule (150 mg total) by mouth 2 (two) times daily.  Marland Kitchen OTEZLA 30 MG TABS Take 1 tablet by mouth daily.   . rosuvastatin (CRESTOR) 20 MG tablet Take 1 tablet (20 mg total) by mouth daily.  Marland Kitchen telmisartan (MICARDIS) 40 MG tablet Take 1 tablet (40 mg total) by mouth daily.  . Zinc Acetate, Oral, (ZINC ACETATE PO) Take 1 tablet by mouth daily.   No facility-administered encounter medications on file as of 02/12/2020.   Physical Exam: Blood pressure 120/78, pulse 80, temperature (!) 97.3 F (36.3 C), temperature source Temporal, height 5\' 6"  (1.676 m), weight  167 lb 3.7 oz (75.9 kg), SpO2 96 %. Gen:      No acute distress HEENT:  EOMI, sclera anicteric Neck:     No masses; no thyromegaly Lungs:    Bibasal crackles CV:         Regular rate and rhythm; no murmurs Abd:      + bowel sounds; soft, non-tender; no palpable masses, no distension Ext:    No edema; adequate peripheral perfusion Skin:      Warm and dry; no rash Neuro: alert and oriented x 3 Psych: normal mood and affect  Data  Reviewed: Imaging: High-res CT 11/19/2019-peripheral and basal subpleural reticulation with groundglass, traction bronchiectasis.  Probable honeycombing.  5 mm lung nodule.  Probable UIP pattern  6-minute walk 01/12/2020-408 m, nadir O2 sat of 92%  Labs: CMP 10/22- -within normal limits CBC 10/22 -WBC 10.4, eos 6%, absolute eosinophil count 624   CTD serologies 12/02/19-negative  Assessment:  Pulmonary fibrosis Reviewed CT in probable UIP pattern.  No known exposures or signs and symptoms of connective tissue disease Serologies for CTD are negative  Reviewed at multidisciplinary conference on 1/12.  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  Started Ofev and he is tolerating it well Check CBC, CMP today.  Patient requests addition of TSH as he recently had modification to his Synthroid by primary care Awaiting PFTs Follow-up in 4 weeks  Plan/Recommendations: - Continue Ofev - CBC, CMP, TSH  Marshell Garfinkel MD [place] [facility] 02/12/2020, 2:36 PM  CC: [name], DO

## 2020-02-16 ENCOUNTER — Ambulatory Visit: Payer: Medicare HMO | Admitting: Gastroenterology

## 2020-02-16 ENCOUNTER — Telehealth: Payer: Self-pay | Admitting: Pharmacy Technician

## 2020-02-16 NOTE — Telephone Encounter (Signed)
Called [employer] to see if patient can receive a 6 month supply of Ofev for his upcoming [place] trip in May. Was transferred 4 times, then was advised that the pharmacy help desk handles vacation overrides. Called pharmacy help desk, rep Velna Hatchet advisedpt's plan allows vacation policy allows 90 day supplies for non-specialty medications and only 30 day supplies for Specialty medications, no other exceptions are allowed.  Phone# [phone]  3:08 PM Beatriz Chancellor, CPhT

## 2020-02-17 ENCOUNTER — Ambulatory Visit (INDEPENDENT_AMBULATORY_CARE_PROVIDER_SITE_OTHER): Payer: Medicare HMO | Admitting: Gastroenterology

## 2020-02-17 ENCOUNTER — Other Ambulatory Visit: Payer: Self-pay

## 2020-02-17 DIAGNOSIS — R634 Abnormal weight loss: Secondary | ICD-10-CM | POA: Diagnosis not present

## 2020-02-17 NOTE — Progress Notes (Signed)
Nicholas Fleming  [address]  [place], [place] [address]  Main: [phone]  Fax: [phone]   Gastroenterology Consultation  Referring Provider:     [name], DO Primary Care Physician:  [name], DO Reason for Consultation:     Unintentional weight loss        HPI:   Virtual Visit via video  Note  I connected with patient on 02/17/20 at  2:00 PM EST by video  and verified that I am speaking with the correct person using two identifiers.   I discussed the limitations, risks, security and privacy concerns of performing an evaluation and management service by video and the availability of in person appointments. I also discussed with the patient that there may be a patient responsible charge related to this service. The patient expressed understanding and agreed to proceed.  Location of the patient: Home Location of provider: Home Participating persons: Patient and provider only   History of Present Illness: Unintentional weight loss  Nicholas Fleming is a 74 y.o. y/o male referred for consultation & management  by Dr. Heber Fairbank, Rachel Moulds, DO.     He is a 74 year old gentleman with a past medical history of coronary artery disease, hypertension, diabetes hyperlipidemia.  He has possible interstitial lung disease.  History of cardiac stent placed some years back.  He has been on methotrexate.  At the end of December he developed COVID-19 and was treated with monoclonal antibodies.  Ex-smoker.  He is on Plavix. CT scan of the chest in December 2020 showed features of pulmonary fibrosis possible pulmonary arterial hypertension.  Hemoglobin 02/11/2019 2115.3 g.  Hepatic function panel showed a minimal AST of 45 and an ALT of 80.  TSH of 1.61.  No prior abdominal imaging.Hepatitis C virus antibody negative.Liver function tests checked in 2019 showed normal ALT.  I have been requested to see him for unintentional weight loss.  No prior endoscopy evaluation  or screening colonoscopy.  Slight decrease in appetite.  No dysphagia, abdominal pain, change in bowel habits, rectal bleeding, family history of colon cancer or polyps.  Denies any orthopnea or PND.  Can easily climb up a flight of stairs without getting short of breath.  On Plavix.  Past Medical History:  Diagnosis Date  . Allergy   . Asthma   . Bronchitis   . Controlled type 2 diabetes mellitus ([place]) 10/09/2019  . Essential hypertension 10/09/2019  . Hypothyroidism 10/09/2019  . Psoriasis     Past Surgical History:  Procedure Laterality Date  . TRANSURETHRAL RESECTION OF PROSTATE  ~2010    Prior to Admission medications   Medication Sig Start Date End Date Taking? Authorizing Provider  albuterol (PROVENTIL HFA) 108 (90 Base) MCG/ACT inhaler Inhale 1-2 puffs into the lungs every 6 (six) hours as needed for wheezing or shortness of breath. 01/16/20   [name], DO  aspirin EC 81 MG tablet Take 81 mg by mouth daily.    [provider]  bisacodyl (DULCOLAX) 5 MG EC tablet Take 5 mg by mouth daily as needed for moderate constipation.    [provider]  budesonide-formoterol (SYMBICORT) 80-4.5 MCG/ACT inhaler Inhale 2 puffs into the lungs 2 (two) times daily. 01/16/20   [name], DO  cholecalciferol (VITAMIN D3) 25 MCG (1000 UNIT) tablet Take 1,000 Units by mouth daily.    [provider]  CICLOPIROX-CLOBETASOL-SAL ACID EX Apply topically.    [provider]  clopidogrel (  PLAVIX) 75 MG tablet Take 1 tablet (75 mg total) by mouth daily. 11/17/19   [name], DO  levothyroxine (SYNTHROID) 75 MCG tablet Take 1 tablet (75 mcg total) by mouth daily before breakfast. 10/10/19   [name], DO  metFORMIN (GLUCOPHAGE) 1000 MG tablet Take 1 tablet (1,000 mg total) by mouth daily with breakfast. 11/10/19   [name], DO  metoprolol succinate (TOPROL-XL) 50 MG 24 hr tablet Take 1 tablet (50 mg total) by mouth daily. Take with or  immediately following a meal. 11/17/19   Hoffman, Rachel Moulds, DO  Nintedanib (OFEV) 150 MG CAPS Take 1 capsule (150 mg total) by mouth 2 (two) times daily. 01/16/20   Mannam, Praveen, MD  OTEZLA 30 MG TABS Take 1 tablet by mouth daily.  11/24/19   [provider]  rosuvastatin (CRESTOR) 20 MG tablet Take 1 tablet (20 mg total) by mouth daily. 11/17/19   [name], DO  telmisartan (MICARDIS) 40 MG tablet Take 1 tablet (40 mg total) by mouth daily. 11/17/19   [name], DO  Zinc Acetate, Oral, (ZINC ACETATE PO) Take 1 tablet by mouth daily.    [provider]    Family History  Problem Relation Age of Onset  . Hypertension Mother   . CAD Brother   . Psoriasis Brother      Social History   Tobacco Use  . Smoking status: Never Smoker  . Smokeless tobacco: Never Used  Substance Use Topics  . Alcohol use: Yes    Comment: Daily.  . Drug use: Never    Allergies as of 02/17/2020  . (No Known Allergies)    Review of Systems:    All systems reviewed and negative except where noted in HPI. General Appearance:    Alert, cooperative, no distress, appears stated age  Head:    Normocephalic, without obvious abnormality, atraumatic  Eyes:    PERRL, conjunctiva/corneas clear,  Ears:    Grossly normal hearing    Neurologic:   Grossly appears normal     Observations/Objective:  Labs: CBC    Component Value Date/Time   WBC 8.5 02/12/2020 1513   RBC 4.67 02/12/2020 1513   HGB 15.3 02/12/2020 1513   HGB 15.8 10/09/2019 1230   HCT 45.3 02/12/2020 1513   HCT 47.0 10/09/2019 1230   PLT 250.0 02/12/2020 1513   PLT 233 10/09/2019 1230   MCV 96.8 02/12/2020 1513   MCV 97 10/09/2019 1230   MCH 32.4 10/09/2019 1230   MCHC 33.9 02/12/2020 1513   RDW 13.5 02/12/2020 1513   RDW 12.6 10/09/2019 1230   LYMPHSABS 1.9 02/12/2020 1513   LYMPHSABS 2.0 10/09/2019 1230   MONOABS 1.0 02/12/2020 1513   EOSABS 0.7 02/12/2020 1513   EOSABS 0.6 (H) 10/09/2019 1230   BASOSABS  0.0 02/12/2020 1513   BASOSABS 0.1 10/09/2019 1230   CMP     Component Value Date/Time   NA 134 10/09/2019 1231   K 4.3 10/09/2019 1231   CL 98 10/09/2019 1231   CO2 19 (L) 10/09/2019 1231   GLUCOSE 85 10/09/2019 1231   BUN 11 10/09/2019 1231   CREATININE 0.93 10/09/2019 1231   CALCIUM 9.6 10/09/2019 1231   PROT 7.5 02/12/2020 1513   PROT 7.0 10/09/2019 1231   ALBUMIN 4.1 02/12/2020 1513   ALBUMIN 4.3 10/09/2019 1231   AST 45 (H) 02/12/2020 1513   ALT 80 (H) 02/12/2020 1513   ALKPHOS 91 02/12/2020 1513   BILITOT 0.7  02/12/2020 1513   BILITOT 0.8 10/09/2019 1231   GFRNONAA 81 10/09/2019 1231   GFRAA 94 10/09/2019 1231    Imaging Studies: No results found.  Assessment and Plan:   Iseah Norwick is a 74 y.o. y/o male has been referred for  unintentional weight loss.  No specific symptoms.  Could be related to recent episode of Covid.   Plan 1.  EGD + colonoscopy: We will perform at 7:30 AM on March 23.  Urgent and if negative will need CT scan of the abdomen and pelvis to evaluate unintentional weight loss.  Other evaluation and test could include checking for tuberculosis which can be discussed at a later point 2.  Transaminases are elevated: Were normal 4 months back.  Could be the effect of recent infection from COVID-19 versus effects of methotrexate.  I do see that he has a repeat hepatic function panel scheduled to be checked in the future.  If consistently elevated we can evaluate further. 3.  He will need Plavix holding instructions and clearance from his pulmonologist for the procedure which requires anesthesia. 4.  I will perform a detailed physical exam on the day of his procedure particularly examining his lymph nodes and abdomen for any adenopathy.  I have discussed alternative options, risks & benefits,  which include, but are not limited to, bleeding, infection, perforation,respiratory complication & drug reaction.  The patient agrees with this plan & written  consent will be obtained.     I discussed the assessment and treatment plan with the patient. The patient was provided an opportunity to ask questions and all were answered. The patient agreed with the plan and demonstrated an understanding of the instructions.   The patient was advised to call back or seek an in-person evaluation if the symptoms worsen or if the condition fails to improve as anticipated.  I provided 20 minutes of face-to-face time during this encounter.   Dr Nicholas Bellows MD,MRCP [facility]) Gastroenterology/Hepatology Pager: [phone]   Speech recognition software was used to dictate the above note.

## 2020-02-27 ENCOUNTER — Telehealth: Payer: Self-pay | Admitting: Pulmonary Disease

## 2020-02-27 NOTE — Telephone Encounter (Signed)
Erica please look out for the clearance form and see if Dr. Vaughan Browner will fill them out.

## 2020-02-27 NOTE — Telephone Encounter (Signed)
Clearance has been received and will be given to Dr. Vaughan Browner when he returns.

## 2020-03-03 ENCOUNTER — Telehealth: Payer: Self-pay

## 2020-03-03 NOTE — Telephone Encounter (Signed)
Spoke with pt's son and informed him that we have received pt's blood thinner holding instructions for the Plavix. Pt has been cleared to hold the Plavix starting 5 days prior to the upper/lower endoscopy procedure and may resume the Plavix 1 day after the procedure. Pt's son understands and agrees.

## 2020-03-03 NOTE — Telephone Encounter (Signed)
Thank you Danae Chen. Routing back to triage per protocol.

## 2020-03-03 NOTE — Telephone Encounter (Signed)
Nicholas Fleming, patient is due for surgery on 3/23 - before Dr Vaughan Browner returns to the office for next clinic days.  Should patient have appt with an APP for clearance or will Dr Vaughan Browner be in the office for meeting, etc prior to the 23rd?  Thanks!

## 2020-03-03 NOTE — Telephone Encounter (Signed)
It looks like it was faxed, but upfront did not give me a confirmation. I am going to fax it again just make sure that they got it.

## 2020-03-05 ENCOUNTER — Other Ambulatory Visit: Payer: Self-pay

## 2020-03-05 ENCOUNTER — Other Ambulatory Visit
Admission: RE | Admit: 2020-03-05 | Discharge: 2020-03-05 | Disposition: A | Discharge status: Home / Self Care | Payer: Medicare HMO | Source: Ambulatory Visit | Attending: Gastroenterology | Admitting: Gastroenterology

## 2020-03-05 DIAGNOSIS — Z20822 Contact with and (suspected) exposure to covid-19: Secondary | ICD-10-CM | POA: Diagnosis not present

## 2020-03-05 DIAGNOSIS — Z0182 Encounter for allergy testing: Secondary | ICD-10-CM | POA: Diagnosis not present

## 2020-03-05 DIAGNOSIS — Z01812 Encounter for preprocedural laboratory examination: Secondary | ICD-10-CM | POA: Diagnosis present

## 2020-03-06 LAB — SARS CORONAVIRUS 2 (TAT 6-24 HRS): SARS Coronavirus 2: NEGATIVE

## 2020-03-08 ENCOUNTER — Ambulatory Visit: Payer: Medicare HMO | Admitting: Dermatology

## 2020-03-08 ENCOUNTER — Encounter: Payer: Self-pay | Admitting: Dermatology

## 2020-03-08 ENCOUNTER — Other Ambulatory Visit: Payer: Self-pay

## 2020-03-08 DIAGNOSIS — L409 Psoriasis, unspecified: Secondary | ICD-10-CM

## 2020-03-08 DIAGNOSIS — B354 Tinea corporis: Secondary | ICD-10-CM

## 2020-03-08 MED ORDER — OTEZLA 30 MG PO TABS: 30.0000 mg | ORAL_TABLET | Freq: Two times a day (BID) | ORAL | 0 refills | Status: DC

## 2020-03-08 MED ORDER — FLUCONAZOLE 200 MG PO TABS: ORAL_TABLET | ORAL | 0 refills | Status: DC

## 2020-03-08 NOTE — Progress Notes (Signed)
   Follow-Up Visit   Subjective  Nicholas Fleming is a 74 y.o. male who presents for the following: Follow-up, Psoriasis and psoriasis inversa (pt states much impoved, no itch or scale. Patient on Otezla and tolerating well. No diarrhea, depression or headache. He is using clobetasol cream as needed. ), and Tinea Corporis/cruris (pt states much improved, on oral fluconazole tolerating well. (he is off his atorvastatin while taking fluconazole)).    The following portions of the chart were reviewed this encounter and updated as appropriate:     Review of Systems: No other skin or systemic complaints.  Objective  Well appearing patient in no apparent distress; mood and affect are within normal limits.  A focused examination was performed including chest, axillae, abdomen, back, and buttocks and Feet and toenails. Relevant physical exam findings are noted in the Assessment and Plan.  Objective  Right Axilla, b/l dorsal hand, bil elbows, and anticubital area: Psoriasis and psoriasis inversa Right buttock some scale. Hyperpigmented  area on trunk  Objective  Groin, bilateral feet: Tinea Corporis/Cruris Improved and tolerating medication well, will 12 months for nails to grow out B/L toenail dystrophy with minor scale. No diarrhea, depression or headache Macular hyperpigmentation Axilla, groin, and buttocks,  Assessment & Plan  Psoriasis Right Axilla, b/l dorsal hand, bil elbows, and anticubital area  With hyper pigmentation much improved, patient and son are pleased  Continue Otzela 30mg  QD Tolerating well no side effects D/C clobetasol cream, can restart if scale and itch returns  Apremilast (OTEZLA) 30 MG TABS - Right Axilla, b/l dorsal hand, bil elbows, and anticubital area  Tinea corporis Groin, bilateral feet  Continue Fluconazole 200mg  M,W,F. For 1 month. Do not take Atorvastatin while taking this medication.   fluconazole (DIFLUCAN) 200 MG tablet - Groin, bilateral  feet  Return in about 7 months (around 10/08/2020) for  in October.   Marene Lenz, CMA, am acting as scribe for Sarina Ser, MD .

## 2020-03-09 ENCOUNTER — Ambulatory Visit
Admission: RE | Admit: 2020-03-09 | Discharge: 2020-03-09 | Disposition: A | Discharge status: Home / Self Care | Payer: Medicare HMO | Attending: Gastroenterology | Admitting: Gastroenterology

## 2020-03-09 ENCOUNTER — Other Ambulatory Visit: Payer: Self-pay

## 2020-03-09 ENCOUNTER — Encounter
Admission: RE | Disposition: A | Discharge status: Home / Self Care | Payer: Self-pay | Source: Home / Self Care | Attending: Gastroenterology

## 2020-03-09 ENCOUNTER — Encounter: Payer: Self-pay | Admitting: Gastroenterology

## 2020-03-09 ENCOUNTER — Ambulatory Visit: Payer: Medicare HMO | Admitting: Anesthesiology

## 2020-03-09 DIAGNOSIS — E119 Type 2 diabetes mellitus without complications: Secondary | ICD-10-CM | POA: Insufficient documentation

## 2020-03-09 DIAGNOSIS — Z7902 Long term (current) use of antithrombotics/antiplatelets: Secondary | ICD-10-CM | POA: Insufficient documentation

## 2020-03-09 DIAGNOSIS — R634 Abnormal weight loss: Secondary | ICD-10-CM | POA: Insufficient documentation

## 2020-03-09 DIAGNOSIS — Z7951 Long term (current) use of inhaled steroids: Secondary | ICD-10-CM | POA: Diagnosis not present

## 2020-03-09 DIAGNOSIS — Z7984 Long term (current) use of oral hypoglycemic drugs: Secondary | ICD-10-CM | POA: Insufficient documentation

## 2020-03-09 DIAGNOSIS — D123 Benign neoplasm of transverse colon: Secondary | ICD-10-CM | POA: Insufficient documentation

## 2020-03-09 DIAGNOSIS — Z79899 Other long term (current) drug therapy: Secondary | ICD-10-CM | POA: Diagnosis not present

## 2020-03-09 DIAGNOSIS — I10 Essential (primary) hypertension: Secondary | ICD-10-CM | POA: Insufficient documentation

## 2020-03-09 DIAGNOSIS — R69 Illness, unspecified: Secondary | ICD-10-CM | POA: Diagnosis not present

## 2020-03-09 DIAGNOSIS — E039 Hypothyroidism, unspecified: Secondary | ICD-10-CM | POA: Insufficient documentation

## 2020-03-09 DIAGNOSIS — I2510 Atherosclerotic heart disease of native coronary artery without angina pectoris: Secondary | ICD-10-CM | POA: Diagnosis not present

## 2020-03-09 DIAGNOSIS — Z8249 Family history of ischemic heart disease and other diseases of the circulatory system: Secondary | ICD-10-CM | POA: Diagnosis not present

## 2020-03-09 DIAGNOSIS — Z79890 Hormone replacement therapy (postmenopausal): Secondary | ICD-10-CM | POA: Diagnosis not present

## 2020-03-09 DIAGNOSIS — J45909 Unspecified asthma, uncomplicated: Secondary | ICD-10-CM | POA: Insufficient documentation

## 2020-03-09 DIAGNOSIS — K635 Polyp of colon: Secondary | ICD-10-CM | POA: Diagnosis not present

## 2020-03-09 DIAGNOSIS — D124 Benign neoplasm of descending colon: Secondary | ICD-10-CM | POA: Insufficient documentation

## 2020-03-09 DIAGNOSIS — Z951 Presence of aortocoronary bypass graft: Secondary | ICD-10-CM | POA: Diagnosis not present

## 2020-03-09 DIAGNOSIS — D126 Benign neoplasm of colon, unspecified: Secondary | ICD-10-CM | POA: Diagnosis not present

## 2020-03-09 DIAGNOSIS — Z7982 Long term (current) use of aspirin: Secondary | ICD-10-CM | POA: Insufficient documentation

## 2020-03-09 DIAGNOSIS — K640 First degree hemorrhoids: Secondary | ICD-10-CM | POA: Diagnosis not present

## 2020-03-09 HISTORY — PX: COLONOSCOPY WITH PROPOFOL: SHX5780

## 2020-03-09 HISTORY — PX: ESOPHAGOGASTRODUODENOSCOPY (EGD) WITH PROPOFOL: SHX5813

## 2020-03-09 LAB — GLUCOSE, CAPILLARY: Glucose-Capillary: 105 mg/dL — ABNORMAL HIGH (ref 70–99)

## 2020-03-09 SURGERY — COLONOSCOPY WITH PROPOFOL
Anesthesia: General

## 2020-03-09 MED ORDER — PROPOFOL 10 MG/ML IV BOLUS
INTRAVENOUS | Status: DC | PRN
  Administered 2020-03-09: 70 mg via INTRAVENOUS
  Administered 2020-03-09: 10 mg via INTRAVENOUS

## 2020-03-09 MED ORDER — LIDOCAINE HCL (CARDIAC) PF 100 MG/5ML IV SOSY
PREFILLED_SYRINGE | INTRAVENOUS | Status: DC | PRN
  Administered 2020-03-09: 100 mg via INTRAVENOUS

## 2020-03-09 MED ORDER — PROPOFOL 500 MG/50ML IV EMUL
INTRAVENOUS | Status: DC | PRN
  Administered 2020-03-09: 140 ug/kg/min via INTRAVENOUS

## 2020-03-09 MED ORDER — SODIUM CHLORIDE 0.9 % IV SOLN: INTRAVENOUS | Status: DC

## 2020-03-09 NOTE — Anesthesia Preprocedure Evaluation (Signed)
Anesthesia Evaluation  Patient identified by MRN, date of birth, ID band Patient awake    Reviewed: Allergy & Precautions, H&P , NPO status , Patient's Chart, lab work & pertinent test results, reviewed documented beta blocker date and time   Airway Mallampati: II   Neck ROM: full    Dental  (+) Poor Dentition   Pulmonary neg pulmonary ROS, asthma , pneumonia, resolved,    Pulmonary exam normal        Cardiovascular Exercise Tolerance: Poor hypertension, On Medications + CAD  negative cardio ROS Normal cardiovascular exam Rhythm:regular Rate:Normal     Neuro/Psych negative neurological ROS  negative psych ROS   GI/Hepatic negative GI ROS, Neg liver ROS,   Endo/Other  negative endocrine ROSdiabetes, Well Controlled, Type 2, Oral Hypoglycemic AgentsHypothyroidism   Renal/GU negative Renal ROS  negative genitourinary   Musculoskeletal   Abdominal   Peds  Hematology negative hematology ROS (+)   Anesthesia Other Findings Past Medical History: No date: Allergy No date: Asthma No date: Bronchitis 10/09/2019: Controlled type 2 diabetes mellitus ([place]) 10/09/2019: Essential hypertension 10/09/2019: Hypothyroidism No date: Psoriasis Past Surgical History: ~2010: TRANSURETHRAL RESECTION OF PROSTATE   Reproductive/Obstetrics negative OB ROS                             Anesthesia Physical Anesthesia Plan  ASA: III  Anesthesia Plan: General   Post-op Pain Management:    Induction:   PONV Risk Score and Plan:   Airway Management Planned:   Additional Equipment:   Intra-op Plan:   Post-operative Plan:   Informed Consent: I have reviewed the patients History and Physical, chart, labs and discussed the procedure including the risks, benefits and alternatives for the proposed anesthesia with the patient or authorized representative who has indicated his/her understanding and  acceptance.     Dental Advisory Given  Plan Discussed with: CRNA  Anesthesia Plan Comments:         Anesthesia Quick Evaluation

## 2020-03-09 NOTE — H&P (Signed)
Nicholas Bellows, MD [address], [place], [place], [place], [address] [address], [place], [place], [place], [address] Phone: [phone]  Fax: [phone]  Primary Care Physician:  [name], DO   Pre-Procedure History & Physical: HPI:  Nicholas Fleming is a 74 y.o. male is here for an endoscopy and colonoscopy    Past Medical History:  Diagnosis Date  . Allergy   . Asthma   . Bronchitis   . Controlled type 2 diabetes mellitus ([place]) 10/09/2019  . Essential hypertension 10/09/2019  . Hypothyroidism 10/09/2019  . Psoriasis     Past Surgical History:  Procedure Laterality Date  . TRANSURETHRAL RESECTION OF PROSTATE  ~2010    Prior to Admission medications   Medication Sig Start Date End Date Taking? Authorizing Provider  aspirin EC 81 MG tablet Take 81 mg by mouth daily.   Yes [provider]  metFORMIN (GLUCOPHAGE) 1000 MG tablet Take 1 tablet (1,000 mg total) by mouth daily with breakfast. 11/10/19  Yes [name], DO  albuterol (PROVENTIL HFA) 108 (90 Base) MCG/ACT inhaler Inhale 1-2 puffs into the lungs every 6 (six) hours as needed for wheezing or shortness of breath. 01/16/20   [name], DO  Apremilast (OTEZLA) 30 MG TABS Take 1 tablet (30 mg total) by mouth 2 (two) times daily. 03/08/20   Ralene Bathe, MD  bisacodyl (DULCOLAX) 5 MG EC tablet Take 5 mg by mouth daily as needed for moderate constipation.    [provider]  budesonide-formoterol (SYMBICORT) 80-4.5 MCG/ACT inhaler Inhale 2 puffs into the lungs 2 (two) times daily. 01/16/20   [name], DO  cholecalciferol (VITAMIN D3) 25 MCG (1000 UNIT) tablet Take 1,000 Units by mouth daily.    [provider]  CICLOPIROX-CLOBETASOL-SAL ACID EX Apply topically.    [provider]  clopidogrel (PLAVIX) 75 MG tablet Take 1 tablet (75 mg total) by mouth daily. 11/17/19   [name], DO  fluconazole (DIFLUCAN) 200 MG tablet Take 200mg  tab on Monday,  Wednesday, and Friday for 1 month 03/08/20   Ralene Bathe, MD  levothyroxine (SYNTHROID) 75 MCG tablet Take 1 tablet (75 mcg total) by mouth daily before breakfast. 10/10/19   [name], DO  metoprolol succinate (TOPROL-XL) 50 MG 24 hr tablet Take 1 tablet (50 mg total) by mouth daily. Take with or immediately following a meal. 11/17/19   Hoffman, Rachel Moulds, DO  Nintedanib (OFEV) 150 MG CAPS Take 1 capsule (150 mg total) by mouth 2 (two) times daily. 01/16/20   Mannam, Praveen, MD  OTEZLA 30 MG TABS Take 1 tablet by mouth daily.  11/24/19   [provider]  rosuvastatin (CRESTOR) 20 MG tablet Take 1 tablet (20 mg total) by mouth daily. 11/17/19   [name], DO  telmisartan (MICARDIS) 40 MG tablet Take 1 tablet (40 mg total) by mouth daily. 11/17/19   [name], DO  Zinc Acetate, Oral, (ZINC ACETATE PO) Take 1 tablet by mouth daily.    [provider]    Allergies as of 02/18/2020  . (No Known Allergies)    Family History  Problem Relation Age of Onset  . Hypertension Mother   . CAD Brother   . Psoriasis Brother     Social History   Socioeconomic History  . Marital status: Married    Spouse name: Not on file  . Number of children: Not on file  . Years of education:  Not on file  . Highest education level: Not on file  Occupational History  . Not on file  Tobacco Use  . Smoking status: Never Smoker  . Smokeless tobacco: Never Used  Substance and Sexual Activity  . Alcohol use: Yes    Comment: 2 to 3 times per week  . Drug use: Never  . Sexual activity: Not on file  Other Topics Concern  . Not on file  Social History Narrative  . Not on file   Social Determinants of Health   Financial Resource Strain:   . Difficulty of Paying Living Expenses:   Food Insecurity:   . Worried About Charity fundraiser in the Last Year:   . Arboriculturist in the Last Year:   Transportation Needs:   . Film/video editor (Medical):   Marland Kitchen Lack of  Transportation (Non-Medical):   Physical Activity:   . Days of Exercise per Week:   . Minutes of Exercise per Session:   Stress:   . Feeling of Stress :   Social Connections:   . Frequency of Communication with Friends and Family:   . Frequency of Social Gatherings with Friends and Family:   . Attends Religious Services:   . Active Member of Clubs or Organizations:   . Attends Archivist Meetings:   Marland Kitchen Marital Status:   Intimate Partner Violence:   . Fear of Current or Ex-Partner:   . Emotionally Abused:   Marland Kitchen Physically Abused:   . Sexually Abused:     Review of Systems: See HPI, otherwise negative ROS  Physical Exam: BP 136/85   Pulse (!) 120   Temp (!) 96.8 F (36 C) (Temporal)   Resp 18   Ht 5\' 6"  (1.676 m)   Wt 74.4 kg   SpO2 100%   BMI 26.47 kg/m  General:   Alert,  pleasant and cooperative in NAD Head:  Normocephalic and atraumatic. Neck:  Supple; no masses or thyromegaly. Lungs:  Clear throughout to auscultation, normal respiratory effort.    Heart:  +S1, +S2, Regular rate and rhythm, No edema. Abdomen:  Soft, nontender and nondistended. Normal bowel sounds, without guarding, and without rebound.   Neurologic:  Alert and  oriented x4;  grossly normal neurologically.  Impression/Plan: Nicholas Fleming is here for an endoscopy and colonoscopy  to be performed for  evaluation of unintentional weight loss.     Risks, benefits, limitations, and alternatives regarding endoscopy have been reviewed with the patient.  Questions have been answered.  All parties agreeable.   Nicholas Bellows, MD  03/09/2020, 8:02 AM

## 2020-03-09 NOTE — Transfer of Care (Signed)
Immediate Anesthesia Transfer of Care Note  Patient: Nicholas Fleming  Procedure(s) Performed: COLONOSCOPY WITH PROPOFOL (N/A ) ESOPHAGOGASTRODUODENOSCOPY (EGD) WITH PROPOFOL (N/A )  Patient Location: PACU  Anesthesia Type:General  Level of Consciousness: sedated  Airway & Oxygen Therapy: Patient Spontanous Breathing  Post-op Assessment: Report given to RN and Post -op Vital signs reviewed and stable  Post vital signs: Reviewed and stable  Last Vitals:  Vitals Value Taken Time  BP    Temp    Pulse    Resp    SpO2      Last Pain:  Vitals:   03/09/20 0749  TempSrc: Temporal  PainSc: 0-No pain         Complications: No apparent anesthesia complications

## 2020-03-09 NOTE — Op Note (Signed)
[facility] Patient Name: Nicholas Fleming Procedure Date: 03/09/2020 8:06 AM MRN: [id number] Account #: [id number] Date of Birth: [birth year] Admit Type: Outpatient Age: 74 Room: [facility] ENDO ROOM 1 Gender: Male Note Status: Finalized Procedure:             Upper GI endoscopy Indications:           Weight loss Providers:             [name] MD, MD Medicines:             Monitored Anesthesia Care Complications:         No immediate complications. Procedure:             Pre-Anesthesia Assessment:                        - Prior to the procedure, a History and Physical was                         performed, and patient medications, allergies and                         sensitivities were reviewed. The patient's tolerance                         of previous anesthesia was reviewed.                        - The risks and benefits of the procedure and the                         sedation options and risks were discussed with the                         patient. All questions were answered and informed                         consent was obtained.                        - ASA Grade Assessment: II - A patient with mild                         systemic disease.                        After obtaining informed consent, the endoscope was                         passed under direct vision. Throughout the procedure,                         the patient's blood pressure, pulse, and oxygen                         saturations were monitored continuously. The Endoscope                         was introduced through the mouth, and advanced to the  third part of duodenum. The upper GI endoscopy was                         accomplished with ease. The patient tolerated the                         procedure well. Findings:      The esophagus was normal.      The stomach was normal.      The examined duodenum was normal.      The cardia and  gastric fundus were normal on retroflexion. Impression:            - Normal esophagus.                        - Normal stomach.                        - Normal examined duodenum.                        - No specimens collected. Recommendation:        - Perform a colonoscopy today. Procedure Code(s):     --- Professional ---                        [phone], Esophagogastroduodenoscopy, flexible,                         transoral; diagnostic, including collection of                         specimen(s) by brushing or washing, when performed                         (separate procedure) Diagnosis Code(s):     --- Professional ---                        R63.4, Abnormal weight loss CPT copyright 2019 American Medical Association. All rights reserved. The codes documented in this report are preliminary and upon coder review may  be revised to meet current compliance requirements. [name], MD [name] MD, MD 03/09/2020 8:14:50 AM This report has been signed electronically. Number of Addenda: 0 Note Initiated On: 03/09/2020 8:06 AM Estimated Blood Loss:  Estimated blood loss: none.      [facility]

## 2020-03-09 NOTE — Anesthesia Postprocedure Evaluation (Signed)
Anesthesia Post Note  Patient: Nicholas Fleming  Procedure(s) Performed: COLONOSCOPY WITH PROPOFOL (N/A ) ESOPHAGOGASTRODUODENOSCOPY (EGD) WITH PROPOFOL (N/A )  Patient location during evaluation: PACU Anesthesia Type: General Level of consciousness: awake and alert Pain management: pain level controlled Vital Signs Assessment: post-procedure vital signs reviewed and stable Respiratory status: spontaneous breathing, nonlabored ventilation, respiratory function stable and patient connected to nasal cannula oxygen Cardiovascular status: blood pressure returned to baseline and stable Postop Assessment: no apparent nausea or vomiting Anesthetic complications: no     Last Vitals:  Vitals:   03/09/20 0904 03/09/20 0914  BP: 109/78 123/82  Pulse: 89 98  Resp: (!) 23 (!) 23  Temp:    SpO2: 100% 99%    Last Pain:  Vitals:   03/09/20 0914  TempSrc:   PainSc: 0-No pain                 Molli Barrows

## 2020-03-09 NOTE — Op Note (Signed)
[facility] Patient Name: Nicholas Fleming Procedure Date: 03/09/2020 8:06 AM MRN: [id number] Account #: [id number] Date of Birth: [birth year] Admit Type: Outpatient Age: 74 Room: [facility] ENDO ROOM 1 Gender: Male Note Status: Finalized Procedure:             Colonoscopy Indications:           Weight loss Providers:             [name] MD, MD Medicines:             Monitored Anesthesia Care Complications:         No immediate complications. Procedure:             Pre-Anesthesia Assessment:                        - Prior to the procedure, a History and Physical was                         performed, and patient medications, allergies and                         sensitivities were reviewed. The patient's tolerance                         of previous anesthesia was reviewed.                        - The risks and benefits of the procedure and the                         sedation options and risks were discussed with the                         patient. All questions were answered and informed                         consent was obtained.                        - ASA Grade Assessment: II - A patient with mild                         systemic disease.                        After obtaining informed consent, the colonoscope was                         passed under direct vision. Throughout the procedure,                         the patient's blood pressure, pulse, and oxygen                         saturations were monitored continuously. The                         Colonoscope was introduced through the anus and  advanced to the the terminal ileum. The colonoscopy                         was performed with ease. The patient tolerated the                         procedure well. The quality of the bowel preparation                         was excellent. Findings:      The perianal and digital rectal examinations were normal.  A 8 mm polyp was found in the descending colon. The polyp was sessile.       The polyp was removed with a cold snare. Resection and retrieval were       complete.      A 5 mm polyp was found in the transverse colon. The polyp was sessile.       The polyp was removed with a cold snare. Resection and retrieval were       complete.      Non-bleeding internal hemorrhoids were found during retroflexion. The       hemorrhoids were medium-sized and Grade I (internal hemorrhoids that do       not prolapse).      The exam was otherwise without abnormality on direct and retroflexion       views.      The terminal ileum appeared normal.      The exam was otherwise without abnormality on direct and retroflexion       views. Impression:            - One 8 mm polyp in the descending colon, removed with                         a cold snare. Resected and retrieved.                        - One 5 mm polyp in the transverse colon, removed with                         a cold snare. Resected and retrieved.                        - Non-bleeding internal hemorrhoids.                        - The examination was otherwise normal on direct and                         retroflexion views.                        - The examined portion of the ileum was normal.                        - The examination was otherwise normal on direct and                         retroflexion views. Recommendation:        - Discharge patient to home (with escort).                        -  Resume previous diet.                        - Continue present medications.                        - Await pathology results.                        - Repeat colonoscopy for surveillance based on                         pathology results. Procedure Code(s):     --- Professional ---                        [phone], Colonoscopy, flexible; with removal of                         tumor(s), polyp(s), or other lesion(s) by snare                          technique Diagnosis Code(s):     --- Professional ---                        K63.5, Polyp of colon                        K64.0, First degree hemorrhoids                        R63.4, Abnormal weight loss CPT copyright 2019 American Medical Association. All rights reserved. The codes documented in this report are preliminary and upon coder review may  be revised to meet current compliance requirements. [name], MD [name] MD, MD 03/09/2020 8:45:47 AM This report has been signed electronically. Number of Addenda: 0 Note Initiated On: 03/09/2020 8:06 AM Scope Withdrawal Time: 0 hours 12 minutes 55 seconds  Total Procedure Duration: 0 hours 25 minutes 4 seconds  Estimated Blood Loss:  Estimated blood loss: none.      [facility]

## 2020-03-10 ENCOUNTER — Encounter: Payer: Self-pay | Admitting: *Deleted

## 2020-03-10 LAB — SURGICAL PATHOLOGY

## 2020-03-11 ENCOUNTER — Encounter: Payer: Self-pay | Admitting: Gastroenterology

## 2020-03-12 ENCOUNTER — Encounter: Payer: Self-pay | Admitting: Pulmonary Disease

## 2020-03-12 ENCOUNTER — Ambulatory Visit: Payer: Medicare HMO | Admitting: Pulmonary Disease

## 2020-03-12 ENCOUNTER — Other Ambulatory Visit: Payer: Self-pay

## 2020-03-12 VITALS — BP 115/70 | HR 84 | Temp 97.6°F | Ht 66.0 in | Wt 163.6 lb

## 2020-03-12 DIAGNOSIS — J84112 Idiopathic pulmonary fibrosis: Secondary | ICD-10-CM

## 2020-03-12 LAB — HEPATIC FUNCTION PANEL
ALT: 75 U/L — ABNORMAL HIGH (ref 0–53)
AST: 51 U/L — ABNORMAL HIGH (ref 0–37)
Albumin: 4.2 g/dL (ref 3.5–5.2)
Alkaline Phosphatase: 102 U/L (ref 39–117)
Bilirubin, Direct: 0.2 mg/dL (ref 0.0–0.3)
Total Bilirubin: 0.7 mg/dL (ref 0.2–1.2)
Total Protein: 7.5 g/dL (ref 6.0–8.3)

## 2020-03-12 NOTE — Patient Instructions (Signed)
We will check hepatic function panel Follow-up in 1 month

## 2020-03-12 NOTE — Progress Notes (Signed)
Nicholas Fleming    [id number]    1946-05-31  Primary Care Physician:Hoffman, Rachel Moulds, DO  Referring Physician: [name], DO [address]  [place],  [place] [address]  Chief complaint: Follow-up for IPF  HPI: 74 year old with history of angina, hypertension, diabetes, hyperlipidemia, psoriasis.  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  Cough is nonproductive in nature associated with mild dyspnea on exertion.  He has history of angina, coronary artery disease with a stent placement many years ago.  He has longstanding psoriasis presenting as rash.  He was on methotrexate for 2 years around 2013 and self discontinued due to concern for side effects.  Recently has been started on [place] with improvement in rash.  He developed COVID-19 infection at the end of December and treated with monoclonal antibody on 12/29 with improvement in symptoms. Started on Ofev at the end of January 2021.  He is tolerating it well with no GI issues.  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  No mold, hot tub, Jacuzzi, no down pillows or comforter ILD questionnaire 01/12/2020-negative Smoking history: Used to smoke socially.  Quit many years ago Travel history: Originally from [place].  He had lived in [place] for over 30 years and now spends his time between [place] and [place] Relevant family history: No significant family history of lung disease  Interval history: Tolerating Ofev well with no issues.  Breathing is doing well.  States that his cough actually got better while on the Ofev Evaluated by GI for an unexplained 10 to 20 pound weight loss.  Underwent EGD and colonoscopy in 3/23 with findings of benign colon  polyps.  He scheduled to get a CT abdomen in the near future.  Outpatient Encounter Medications as of 03/12/2020  Medication Sig  . albuterol (PROVENTIL HFA) 108 (90 Base) MCG/ACT inhaler Inhale 1-2 puffs into the lungs every 6 (six) hours as needed for wheezing or shortness of breath.  . Apremilast (OTEZLA) 30 MG TABS Take 1 tablet (30 mg total) by mouth 2 (two) times daily.  Marland Kitchen aspirin EC 81 MG tablet Take 81 mg by mouth daily.  . bisacodyl (DULCOLAX) 5 MG EC tablet Take 5 mg by mouth daily as needed for moderate constipation.  . budesonide-formoterol (SYMBICORT) 80-4.5 MCG/ACT inhaler Inhale 2 puffs into the lungs 2 (two) times daily.  . cholecalciferol (VITAMIN D3) 25 MCG (1000 UNIT) tablet Take 1,000 Units by mouth daily.  Marland Kitchen CICLOPIROX-CLOBETASOL-SAL ACID EX Apply topically.  . clopidogrel (PLAVIX) 75 MG tablet Take 1 tablet (75 mg total) by mouth daily.  . fluconazole (DIFLUCAN) 200 MG tablet Take 200mg  tab on Monday, Wednesday, and Friday for 1 month  . levothyroxine (SYNTHROID) 75 MCG tablet Take 1 tablet (75 mcg total) by mouth daily before breakfast.  . metFORMIN (GLUCOPHAGE) 1000 MG tablet Take 1 tablet (1,000 mg total) by mouth daily with breakfast.  . metoprolol succinate (TOPROL-XL) 50 MG 24 hr tablet Take 1 tablet (50 mg total) by mouth daily. Take with or immediately following a meal.  . Nintedanib (OFEV) 150 MG CAPS Take 1 capsule (150 mg total) by mouth 2 (two) times daily.  Marland Kitchen OTEZLA 30 MG  TABS Take 1 tablet by mouth daily.   . rosuvastatin (CRESTOR) 20 MG tablet Take 1 tablet (20 mg total) by mouth daily.  Marland Kitchen telmisartan (MICARDIS) 40 MG tablet Take 1 tablet (40 mg total) by mouth daily.  . Zinc Acetate, Oral, (ZINC ACETATE PO) Take 1 tablet by mouth daily.   No facility-administered encounter medications on file as of 03/12/2020.   Physical Exam: Blood pressure 115/70, pulse 84, temperature 97.6 F (36.4 C), temperature source Temporal, height 5\' 6"  (1.676 m), weight 163 lb  9.6 oz (74.2 kg), SpO2 98 %. Gen:      No acute distress HEENT:  EOMI, sclera anicteric Neck:     No masses; no thyromegaly Lungs:    Bibasal crackles CV:         Regular rate and rhythm; no murmurs Abd:      + bowel sounds; soft, non-tender; no palpable masses, no distension Ext:    No edema; adequate peripheral perfusion Skin:      Warm and dry; no rash Neuro: alert and oriented x 3 Psych: normal mood and affect  Data Reviewed: Imaging: High-res CT 11/19/2019-peripheral and basal subpleural reticulation with groundglass, traction bronchiectasis.  Probable honeycombing.  5 mm lung nodule.  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  No known exposures or signs and symptoms of connective tissue disease Serologies for CTD are negative  Reviewed at multidisciplinary conference on 12/30/19.  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  Started Ofev and he is tolerating it well Recheck hepatic panel today.  Transaminases noted to be slightly high at last visit. Follow-up in 4 weeks  Plan/Recommendations: - Continue Ofev - Hepatic panel  Marshell Garfinkel MD [facility] 03/12/2020, 12:37 PM  CC: [name], DO

## 2020-03-15 ENCOUNTER — Ambulatory Visit: Payer: Medicare HMO | Admitting: Gastroenterology

## 2020-03-16 ENCOUNTER — Other Ambulatory Visit
Admission: RE | Admit: 2020-03-16 | Discharge: 2020-03-16 | Disposition: A | Discharge status: Home / Self Care | Payer: Medicare HMO | Source: Ambulatory Visit | Attending: Pulmonary Disease | Admitting: Pulmonary Disease

## 2020-03-16 ENCOUNTER — Other Ambulatory Visit: Payer: Self-pay

## 2020-03-16 ENCOUNTER — Ambulatory Visit: Payer: Medicare HMO

## 2020-03-16 DIAGNOSIS — Z20822 Contact with and (suspected) exposure to covid-19: Secondary | ICD-10-CM | POA: Insufficient documentation

## 2020-03-16 DIAGNOSIS — Z01812 Encounter for preprocedural laboratory examination: Secondary | ICD-10-CM | POA: Diagnosis not present

## 2020-03-16 LAB — SARS CORONAVIRUS 2 (TAT 6-24 HRS): SARS Coronavirus 2: NEGATIVE

## 2020-03-17 ENCOUNTER — Ambulatory Visit
Admission: RE | Admit: 2020-03-17 | Discharge: 2020-03-17 | Disposition: A | Discharge status: Home / Self Care | Payer: Medicare HMO | Source: Ambulatory Visit | Attending: Gastroenterology | Admitting: Gastroenterology

## 2020-03-17 ENCOUNTER — Encounter: Payer: Self-pay | Admitting: Gastroenterology

## 2020-03-17 DIAGNOSIS — K8020 Calculus of gallbladder without cholecystitis without obstruction: Secondary | ICD-10-CM | POA: Diagnosis not present

## 2020-03-17 DIAGNOSIS — R634 Abnormal weight loss: Secondary | ICD-10-CM | POA: Diagnosis present

## 2020-03-17 LAB — POCT I-STAT CREATININE: Creatinine, Ser: 0.9 mg/dL (ref 0.61–1.24)

## 2020-03-17 MED ORDER — IOHEXOL 300 MG/ML  SOLN
100.0000 mL | Freq: Once | INTRAMUSCULAR | Status: AC | PRN
  Administered 2020-03-17: 100 mL via INTRAVENOUS

## 2020-03-18 ENCOUNTER — Ambulatory Visit: Payer: Medicare HMO

## 2020-03-18 ENCOUNTER — Other Ambulatory Visit: Payer: Self-pay

## 2020-03-18 DIAGNOSIS — J84112 Idiopathic pulmonary fibrosis: Secondary | ICD-10-CM

## 2020-03-18 LAB — PULMONARY FUNCTION TEST
DL/VA % pred: 98 %
DL/VA: 3.98 ml/min/mmHg/L
DLCO cor % pred: 52 %
DLCO cor: 11.78 ml/min/mmHg
DLCO unc % pred: 53 %
DLCO unc: 12 ml/min/mmHg
FEF 25-75 Post: 2.27 L/sec
FEF 25-75 Pre: 2.88 L/sec
FEF2575-%Change-Post: -21 %
FEF2575-%Pred-Post: 118 %
FEF2575-%Pred-Pre: 150 %
FEV1-%Change-Post: -11 %
FEV1-%Pred-Post: 63 %
FEV1-%Pred-Pre: 71 %
FEV1-Post: 1.65 L
FEV1-Pre: 1.86 L
FEV1FVC-%Change-Post: -15 %
FEV1FVC-%Pred-Pre: 121 %
FEV6-%Change-Post: 4 %
FEV6-%Pred-Post: 64 %
FEV6-%Pred-Pre: 62 %
FEV6-Post: 2.19 L
FEV6-Pre: 2.1 L
FEV6FVC-%Change-Post: 0 %
FEV6FVC-%Pred-Post: 106 %
FEV6FVC-%Pred-Pre: 107 %
FVC-%Change-Post: 4 %
FVC-%Pred-Post: 60 %
FVC-%Pred-Pre: 57 %
FVC-Post: 2.2 L
FVC-Pre: 2.1 L
Post FEV1/FVC ratio: 75 %
Post FEV6/FVC ratio: 100 %
Pre FEV1/FVC ratio: 89 %
Pre FEV6/FVC Ratio: 100 %
RV % pred: 63 %
RV: 1.48 L
TLC % pred: 58 %
TLC: 3.66 L

## 2020-03-18 NOTE — Telephone Encounter (Signed)
I spoke with drug representative last Thursday.  They are unable to provide samples.  She recommended attempting another override this month and appeal if not granted.

## 2020-03-18 NOTE — Telephone Encounter (Signed)
Did drug rep ever reach back out to you?

## 2020-04-02 NOTE — Telephone Encounter (Signed)
Thanks for looking into it. I will discuss with patient at return visit

## 2020-04-02 NOTE — Telephone Encounter (Signed)
Patient's plan will not approve a 6 month supply of Ofev for patient's trip. Plan has limit of 30 day supplies at a time for Specialty Medications. They advised to have patient have a family member order and ship medications to him in [place].  Please Advise.

## 2020-04-02 NOTE — Telephone Encounter (Signed)
Called [employer] Member services, rep Freda Munro advised that plan will allow a 1 time vacation override for 30 day supply. Patient would have to call pharmacy help desk within 5 days of filling medication to request override to be placed. When asked about appealing for the 6 month quantity, rep advised I would need to submit a new prior authorization.  Was transferred to [place] review department, rep Fernande Boyden initiated prior authorization for 6 month quantity and when we submitted to clinical pharmacist for completion- pharmacist stated that there is not quantity limit needed. Pharmacist, cancelled PA and transferred me back to pharmacy help desk who takes care of vacation overrides.   Spoke to Pharmacy help desk again, and manager stated that plan only allows for 30 day supply for Specialty Medications. And that in the past, patient's would have family members order and/or mail the medication to them.   10:11 AM Beatriz Chancellor, CPhT     Phone# [phone]

## 2020-04-12 ENCOUNTER — Other Ambulatory Visit: Payer: Self-pay

## 2020-04-12 ENCOUNTER — Other Ambulatory Visit (INDEPENDENT_AMBULATORY_CARE_PROVIDER_SITE_OTHER): Payer: Medicare HMO

## 2020-04-12 ENCOUNTER — Ambulatory Visit: Payer: Medicare HMO | Admitting: Pulmonary Disease

## 2020-04-12 ENCOUNTER — Encounter: Payer: Self-pay | Admitting: Pulmonary Disease

## 2020-04-12 VITALS — BP 116/62 | HR 89 | Temp 97.6°F | Ht 66.0 in | Wt 161.4 lb

## 2020-04-12 DIAGNOSIS — Z5181 Encounter for therapeutic drug level monitoring: Secondary | ICD-10-CM

## 2020-04-12 DIAGNOSIS — J84112 Idiopathic pulmonary fibrosis: Secondary | ICD-10-CM | POA: Diagnosis not present

## 2020-04-12 LAB — HEPATIC FUNCTION PANEL
ALT: 63 U/L — ABNORMAL HIGH (ref 0–53)
AST: 45 U/L — ABNORMAL HIGH (ref 0–37)
Albumin: 4.3 g/dL (ref 3.5–5.2)
Alkaline Phosphatase: 103 U/L (ref 39–117)
Bilirubin, Direct: 0.2 mg/dL (ref 0.0–0.3)
Total Bilirubin: 0.7 mg/dL (ref 0.2–1.2)
Total Protein: 7.5 g/dL (ref 6.0–8.3)

## 2020-04-12 NOTE — Progress Notes (Signed)
Nicholas Fleming    [id number]    Mar 04, 1946  Primary Care Physician:Hoffman, Rachel Moulds, DO  Referring Physician: [name], DO [address]  [place],  [place] [address]  Chief complaint: Follow-up for IPF  HPI: 74 year old with history of angina, hypertension, diabetes, hyperlipidemia, psoriasis.  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  Cough is nonproductive in nature associated with mild dyspnea on exertion.  He has history of angina, coronary artery disease with a stent placement many years ago.  He has longstanding psoriasis presenting as rash.  He was on methotrexate for 2 years around 2013 and self discontinued due to concern for side effects.  Recently has been started on [place] with improvement in rash.  He developed COVID-19 infection at the end of December and treated with monoclonal antibody on 12/29 with improvement in symptoms. Started on Ofev at the end of January 2021.  He is tolerating it well with no GI issues.  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  No mold, hot tub, Jacuzzi, no down pillows or comforter ILD questionnaire 01/12/2020-negative Smoking history: Used to smoke socially.  Quit many years ago Travel history: Originally from [place].  He had lived in [place] for over [place] years and now spends his time between [place] and [place] Relevant family history: No significant family history of lung disease  Interval history: Tolerating Ofev well with no issues.  Breathing is doing well.  States that his cough actually got better while on the Ofev Evaluated by GI for an unexplained 10 to 20 pound weight loss.  Underwent EGD and colonoscopy in 3/23 with findings of benign colon  polyps.    CT abdomen reviewed with stable pulmonary fibrosis at the base.  Outpatient Encounter Medications as of 04/12/2020  Medication Sig  . albuterol (PROVENTIL HFA) 108 (90 Base) MCG/ACT inhaler Inhale 1-2 puffs into the lungs every 6 (six) hours as needed for wheezing or shortness of breath.  . Apremilast (OTEZLA) 30 MG TABS Take 1 tablet (30 mg total) by mouth 2 (two) times daily.  Marland Kitchen aspirin EC 81 MG tablet Take 81 mg by mouth daily.  . bisacodyl (DULCOLAX) 5 MG EC tablet Take 5 mg by mouth daily as needed for moderate constipation.  . budesonide-formoterol (SYMBICORT) 80-4.5 MCG/ACT inhaler Inhale 2 puffs into the lungs 2 (two) times daily.  . cholecalciferol (VITAMIN D3) 25 MCG (1000 UNIT) tablet Take 1,000 Units by mouth daily.  Marland Kitchen CICLOPIROX-CLOBETASOL-SAL ACID EX Apply topically.  . clopidogrel (PLAVIX) 75 MG tablet Take 1 tablet (75 mg total) by mouth daily.  . fluconazole (DIFLUCAN) 200 MG tablet Take 200mg  tab on Monday, Wednesday, and Friday for 1 month  . levothyroxine (SYNTHROID) 75 MCG tablet Take 1 tablet (75 mcg total) by mouth daily before breakfast.  . metFORMIN (GLUCOPHAGE) 1000 MG tablet Take 1 tablet (1,000 mg total) by mouth daily with breakfast.  . metoprolol succinate (TOPROL-XL) 50 MG 24 hr tablet Take 1 tablet (50 mg total) by mouth daily. Take with or immediately following a meal.  . Nintedanib (OFEV) 150 MG CAPS Take 1 capsule (150 mg total) by mouth 2 (two) times daily.  Marland Kitchen OTEZLA 30  MG TABS Take 1 tablet by mouth daily.   . rosuvastatin (CRESTOR) 20 MG tablet Take 1 tablet (20 mg total) by mouth daily.  Marland Kitchen telmisartan (MICARDIS) 40 MG tablet Take 1 tablet (40 mg total) by mouth daily.  . Zinc Acetate, Oral, (ZINC ACETATE PO) Take 1 tablet by mouth daily.   No facility-administered encounter medications on file as of 04/12/2020.   Physical Exam: Blood pressure 116/62, pulse 89, temperature 97.6 F (36.4 C), temperature source Temporal, height 5\' 6"  (1.676 m),  weight 161 lb 6.4 oz (73.2 kg), SpO2 98 %. Gen:      No acute distress HEENT:  EOMI, sclera anicteric Neck:     No masses; no thyromegaly Lungs:    Minimal basal crackles. CV:         Regular rate and rhythm; no murmurs Abd:      + bowel sounds; soft, non-tender; no palpable masses, no distension Ext:    No edema; adequate peripheral perfusion Skin:      Warm and dry; no rash Neuro: alert and oriented x 3 Psych: normal mood and affect  Data Reviewed: Imaging: High-res CT 11/19/2019-peripheral and basal subpleural reticulation with groundglass, traction bronchiectasis.  Probable honeycombing.  5 mm lung nodule.  Probable UIP pattern  CT abdomen 03/17/2020-stable pulmonary fibrosis at the base. I have reviewed the images personally  PFTs 03/18/2020 FVC 2.20 [60%], FEV1 1.65 [63%],/75, TLC 3.66 [58%], DLCO 12.0 [53%] Severe restriction, moderate-severe diffusion defect.  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  No known exposures or signs and symptoms of connective tissue disease Serologies for CTD are negative  Reviewed at multidisciplinary conference on 12/30/19.  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  Started Ofev and he is tolerating it well Recheck hepatic panel today.  Transaminases noted to be slightly high at last visit. Follow-up in 4 weeks  Plan/Recommendations: - Continue Ofev - Hepatic panel  Marshell Garfinkel MD [facility] 04/12/2020, 9:17 AM  CC: [name], DO

## 2020-04-12 NOTE — Patient Instructions (Signed)
I am glad you are doing well with regard to your breathing and tolerating the medication We will check hepatic function panel today Follow-up in 1 month.

## 2020-04-14 ENCOUNTER — Telehealth: Payer: Self-pay | Admitting: Pulmonary Disease

## 2020-04-14 DIAGNOSIS — J84112 Idiopathic pulmonary fibrosis: Secondary | ICD-10-CM

## 2020-04-14 MED ORDER — OFEV 150 MG PO CAPS: 150.0000 mg | ORAL_CAPSULE | Freq: Two times a day (BID) | ORAL | 3 refills | Status: DC

## 2020-04-14 NOTE — Telephone Encounter (Signed)
Called [facility] and spoke with [place]. They are needing a new prescription for pt's OFEV. Verbal order has been given to [place]. Nothing further needed.

## 2020-05-12 ENCOUNTER — Ambulatory Visit (INDEPENDENT_AMBULATORY_CARE_PROVIDER_SITE_OTHER): Payer: Medicare HMO | Admitting: Pulmonary Disease

## 2020-05-12 ENCOUNTER — Other Ambulatory Visit: Payer: Self-pay

## 2020-05-12 DIAGNOSIS — Z5181 Encounter for therapeutic drug level monitoring: Secondary | ICD-10-CM

## 2020-05-12 DIAGNOSIS — J84112 Idiopathic pulmonary fibrosis: Secondary | ICD-10-CM | POA: Diagnosis not present

## 2020-05-12 NOTE — Patient Instructions (Addendum)
Recheck hepatic function panel to be done at [facility] Follow-up in 1 month with in person or televisit

## 2020-05-12 NOTE — Progress Notes (Signed)
Virtual Visit via Telephone Note  I connected with Nicholas Fleming on 05/12/20 at  3:45 PM EDT by telephone and verified that I am speaking with the correct person using two identifiers.  Location: Patient: Home Provider: [facility], [place], [place]   I discussed the limitations, risks, security and privacy concerns of performing an evaluation and management service by telephone and the availability of in person appointments. I also discussed with the patient that there may be a patient responsible charge related to this service. The patient expressed understanding and agreed to proceed.                 Dahl Litterio    [id number]    1946/05/26  Primary Care Physician:Hoffman, Rachel Moulds, DO  Referring Physician: [name], DO [address]  [place],  [place] [address]  Chief complaint: Follow-up for IPF  HPI: 74 year old with history of angina, hypertension, diabetes, hyperlipidemia, psoriasis.  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  Cough is nonproductive in nature associated with mild dyspnea on exertion.  He has history of angina, coronary artery disease with a stent placement many years ago.  He has longstanding psoriasis presenting as rash.  He was on methotrexate for 2 years around 2013 and self discontinued due to concern for side effects.  Recently has been started on [place] with improvement in rash.  He developed COVID-19 infection at the end of December and treated with monoclonal antibody on 12/29 with improvement in symptoms. Started on Ofev at the end of January 2021.  He is tolerating it well with no GI issues. Evaluated by GI for an unexplained 10 to 20 pound weight loss.  Underwent EGD and colonoscopy in  3/23 with findings of benign colon polyps.    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  No mold, hot tub, Jacuzzi, no down pillows or comforter ILD questionnaire 01/12/2020-negative Smoking history: Used to smoke socially.  Quit many years ago Travel history: Originally from [place].  He had lived in [place] for over 30 years and now spends his time between [place] and [place] Relevant family history: No significant family history of lung disease  Interval history: Tolerating Ofev without any issues. States that weight loss has stabilized.  Outpatient Encounter Medications as of 05/12/2020  Medication Sig  . Apremilast (OTEZLA) 30 MG TABS Take 1 tablet (30 mg total) by mouth 2 (two) times daily.  Marland Kitchen aspirin EC 81 MG tablet Take 81 mg by mouth daily.  . bisacodyl (DULCOLAX) 5 MG EC tablet Take 5 mg by mouth daily as needed for moderate constipation.  . budesonide-formoterol (SYMBICORT) 80-4.5 MCG/ACT inhaler Inhale 2 puffs into the lungs 2 (two) times daily.  . cholecalciferol (VITAMIN D3) 25 MCG (1000 UNIT) tablet Take 1,000 Units by mouth daily.  Marland Kitchen CICLOPIROX-CLOBETASOL-SAL ACID EX Apply topically.  . clopidogrel (PLAVIX) 75 MG tablet Take 1 tablet (75 mg total) by mouth daily.  Marland Kitchen levothyroxine (SYNTHROID) 75 MCG tablet Take 1 tablet (75 mcg total) by mouth daily before breakfast.  . metFORMIN (GLUCOPHAGE) 1000 MG tablet Take 1 tablet (1,000 mg total) by mouth daily with breakfast.  . metoprolol succinate (TOPROL-XL) 50 MG 24 hr tablet  Take 1 tablet (50 mg total) by mouth daily. Take with or immediately following a meal.  . Nintedanib (OFEV) 150 MG CAPS Take 1 capsule (150 mg total) by mouth 2 (two) times daily.  Marland Kitchen OTEZLA 30 MG TABS Take 1 tablet by mouth daily.   . rosuvastatin (CRESTOR) 20 MG tablet Take 1 tablet (20 mg total) by mouth daily.  Marland Kitchen telmisartan (MICARDIS) 40 MG tablet Take 1 tablet (40 mg total) by mouth daily.  . [DISCONTINUED] Zinc  Acetate, Oral, (ZINC ACETATE PO) Take 1 tablet by mouth daily.  Marland Kitchen albuterol (PROVENTIL HFA) 108 (90 Base) MCG/ACT inhaler Inhale 1-2 puffs into the lungs every 6 (six) hours as needed for wheezing or shortness of breath. (Patient not taking: Reported on 05/12/2020)  . [DISCONTINUED] fluconazole (DIFLUCAN) 200 MG tablet Take 200mg  tab on Monday, Wednesday, and Friday for 1 month   No facility-administered encounter medications on file as of 05/12/2020.   Physical Exam: Tele  Data Reviewed: Imaging: High-res CT 11/19/2019-peripheral and basal subpleural reticulation with groundglass, traction bronchiectasis.  Probable honeycombing.  5 mm lung nodule.  Probable UIP pattern  CT abdomen 03/17/2020-stable pulmonary fibrosis at the base. I have reviewed the images personally  PFTs 03/18/2020 FVC 2.20 [60%], FEV1 1.65 [63%],/75, TLC 3.66 [58%], DLCO 12.0 [53%] Severe restriction, moderate-severe diffusion defect.  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  No known exposures or signs and symptoms of connective tissue disease Serologies for CTD are negative  Reviewed at multidisciplinary conference on 12/30/19.  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  Started Ofev and he is tolerating it well Recheck hepatic panel today.  Transaminases noted to be slightly high at last visit. Follow-up in 4 weeks  Plan/Recommendations: - Continue Ofev - Hepatic panel  I discussed the assessment and treatment plan with the patient. The patient was provided an opportunity to ask questions and all were answered. The patient agreed with the plan and demonstrated an understanding of the instructions.   The patient was advised to call back  or seek an in-person evaluation if the symptoms worsen or if the condition fails to improve as anticipated.  I provided 25 minutes of non-face-to-face time during this encounter.  Marshell Garfinkel MD  Pulmonary and Critical Care 05/14/2020, 10:38 AM  CC: [name], DO

## 2020-05-14 ENCOUNTER — Other Ambulatory Visit
Admission: RE | Admit: 2020-05-14 | Discharge: 2020-05-14 | Disposition: A | Discharge status: Home / Self Care | Payer: Medicare HMO | Source: Ambulatory Visit | Attending: Pulmonary Disease | Admitting: Pulmonary Disease

## 2020-05-14 DIAGNOSIS — Z5181 Encounter for therapeutic drug level monitoring: Secondary | ICD-10-CM | POA: Diagnosis not present

## 2020-05-14 LAB — HEPATIC FUNCTION PANEL
ALT: 32 U/L (ref 0–44)
AST: 28 U/L (ref 15–41)
Albumin: 4 g/dL (ref 3.5–5.0)
Alkaline Phosphatase: 61 U/L (ref 38–126)
Bilirubin, Direct: 0.2 mg/dL (ref 0.0–0.2)
Indirect Bilirubin: 0.9 mg/dL (ref 0.3–0.9)
Total Bilirubin: 1.1 mg/dL (ref 0.3–1.2)
Total Protein: 7.3 g/dL (ref 6.5–8.1)

## 2020-06-02 ENCOUNTER — Other Ambulatory Visit: Payer: Self-pay | Admitting: Internal Medicine

## 2020-06-02 MED ORDER — BUDESONIDE-FORMOTEROL FUMARATE 80-4.5 MCG/ACT IN AERO: 2.0000 | INHALATION_SPRAY | Freq: Two times a day (BID) | RESPIRATORY_TRACT | 5 refills | Status: DC

## 2020-06-02 MED ORDER — LEVOTHYROXINE SODIUM 50 MCG PO TABS: 50.0000 ug | ORAL_TABLET | Freq: Every day | ORAL | 3 refills | Status: DC

## 2020-06-02 NOTE — Progress Notes (Signed)
Medication refills, will need appointment to recheck TSH in a few weeks

## 2020-06-10 ENCOUNTER — Telehealth: Payer: Self-pay | Admitting: *Deleted

## 2020-06-10 DIAGNOSIS — J84112 Idiopathic pulmonary fibrosis: Secondary | ICD-10-CM

## 2020-06-10 DIAGNOSIS — R69 Illness, unspecified: Secondary | ICD-10-CM | POA: Diagnosis not present

## 2020-06-10 NOTE — Telephone Encounter (Signed)
-----   Message from Marshell Garfinkel, MD sent at 06/09/2020  5:39 PM EDT ----- Regarding: FW: Labs Also make a follow up appointment with me at next available ----- Message ----- From: Marshell Garfinkel, MD Sent: 06/09/2020   5:27 PM EDT To: [facility] Triage Pool Subject: Labs                                           Can you please order hepatic panel for monthly monitoring while on therapy for IPF. They can be drawn in the next week at [facility].

## 2020-06-10 NOTE — Telephone Encounter (Signed)
Patient has OV scheduled for 06/16/20 at 0930.  Will follow up to make sure Patient gets lab draw at [facility].

## 2020-06-10 NOTE — Telephone Encounter (Signed)
atc patient (son's number) unable to reach left message to call back   Order placed for labs patient just needs to go next week to [facility] and have drawn. This is because of the therapy for his IPF.  Dr. Vaughan Browner has opening for follow up on Wednesday at 0930 if patient calls back and this works for him  Will route to Triage and Lattie Haw for follow up as this was a triage staff message

## 2020-06-11 ENCOUNTER — Other Ambulatory Visit
Admission: RE | Admit: 2020-06-11 | Discharge: 2020-06-11 | Disposition: A | Discharge status: Home / Self Care | Payer: Medicare HMO | Attending: Pulmonary Disease | Admitting: Pulmonary Disease

## 2020-06-11 DIAGNOSIS — J84112 Idiopathic pulmonary fibrosis: Secondary | ICD-10-CM | POA: Diagnosis not present

## 2020-06-11 LAB — HEPATIC FUNCTION PANEL
ALT: 44 U/L (ref 0–44)
AST: 48 U/L — ABNORMAL HIGH (ref 15–41)
Albumin: 3.9 g/dL (ref 3.5–5.0)
Alkaline Phosphatase: 62 U/L (ref 38–126)
Bilirubin, Direct: 0.2 mg/dL (ref 0.0–0.2)
Indirect Bilirubin: 0.9 mg/dL (ref 0.3–0.9)
Total Bilirubin: 1.1 mg/dL (ref 0.3–1.2)
Total Protein: 7.1 g/dL (ref 6.5–8.1)

## 2020-06-14 ENCOUNTER — Telehealth: Payer: Self-pay | Admitting: Internal Medicine

## 2020-06-14 MED ORDER — TELMISARTAN 40 MG PO TABS: 40.0000 mg | ORAL_TABLET | Freq: Every day | ORAL | 3 refills | Status: DC

## 2020-06-14 MED ORDER — CLOPIDOGREL BISULFATE 75 MG PO TABS: 75.0000 mg | ORAL_TABLET | Freq: Every day | ORAL | 3 refills | Status: DC

## 2020-06-14 MED ORDER — METFORMIN HCL 1000 MG PO TABS: 1000.0000 mg | ORAL_TABLET | Freq: Two times a day (BID) | ORAL | 3 refills | Status: DC

## 2020-06-14 MED ORDER — ROSUVASTATIN CALCIUM 20 MG PO TABS: 20.0000 mg | ORAL_TABLET | Freq: Every day | ORAL | 3 refills | Status: DC

## 2020-06-14 MED ORDER — METOPROLOL SUCCINATE ER 50 MG PO TB24: 50.0000 mg | ORAL_TABLET | Freq: Every day | ORAL | 3 refills | Status: DC

## 2020-06-14 NOTE — Telephone Encounter (Signed)
Noted  

## 2020-06-14 NOTE — Telephone Encounter (Signed)
Spoke with Nicholas Fleming, requesting refill, orders placed, will have follow up visit with me in August.

## 2020-06-14 NOTE — Telephone Encounter (Signed)
Pt had labs done on 06/11/2020.

## 2020-06-15 DIAGNOSIS — R69 Illness, unspecified: Secondary | ICD-10-CM | POA: Diagnosis not present

## 2020-06-16 ENCOUNTER — Ambulatory Visit (INDEPENDENT_AMBULATORY_CARE_PROVIDER_SITE_OTHER): Payer: Medicare HMO | Admitting: Pulmonary Disease

## 2020-06-16 ENCOUNTER — Encounter: Payer: Self-pay | Admitting: Pulmonary Disease

## 2020-06-16 ENCOUNTER — Other Ambulatory Visit: Payer: Self-pay

## 2020-06-16 DIAGNOSIS — J84112 Idiopathic pulmonary fibrosis: Secondary | ICD-10-CM

## 2020-06-16 NOTE — Patient Instructions (Signed)
I have reviewed the LFTs which show slightly high borderline levels.  Continue using the Ofev We will check another hepatic function panel in 1 month and follow-up in clinic as televisit in 1 month.

## 2020-06-16 NOTE — Progress Notes (Signed)
Virtual Visit via Telephone Note  I connected with Nicholas Fleming on 06/16/20 at  9:30 AM EDT by telephone and verified that I am speaking with the correct person using two identifiers.  Location: Patient: Home Provider: [facility], [place], [place]   I discussed the limitations, risks, security and privacy concerns of performing an evaluation and management service by telephone and the availability of in person appointments. I also discussed with the patient that there may be a patient responsible charge related to this service. The patient expressed understanding and agreed to proceed.                 Nicholas Fleming    [id number]    [birth year]  Primary Care Physician:Hoffman, Rachel Moulds, DO  Referring Physician: [name], DO [address]  [place],  [place] [address]  Chief complaint: Follow-up for IPF  HPI: 74 year old with history of angina, hypertension, diabetes, hyperlipidemia, psoriasis.  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  Cough is nonproductive in nature associated with mild dyspnea on exertion.  He has history of angina, coronary artery disease with a stent placement many years ago.  He has longstanding psoriasis presenting as rash.  He was on methotrexate for 2 years around 2013 and self discontinued due to concern for side effects.  Recently has been started on [place] with improvement in rash.  He developed COVID-19 infection at the end of December and treated with monoclonal antibody on 12/29 with improvement in symptoms. Started on Ofev at the end of January 2021.  He is tolerating it well with no GI issues. Evaluated by GI for an unexplained 10 to 20 pound weight loss.  Underwent EGD and colonoscopy in  3/23 with findings of benign colon polyps.    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  No mold, hot tub, Jacuzzi, no down pillows or comforter ILD questionnaire 01/12/2020-negative Smoking history: Used to smoke socially.  Quit many years ago Travel history: Originally from [place].  He had lived in [place] for over [place] years and now spends his time between [place] and [place] Relevant family history: No significant family history of lung disease  Interval history: Tolerating Ofev without any issues. States that weight loss has stabilized. Has occasional stomach cramps otherwise doing well.  Outpatient Encounter Medications as of 06/16/2020  Medication Sig   albuterol (PROVENTIL HFA) 108 (90 Base) MCG/ACT inhaler Inhale 1-2 puffs into the lungs every 6 (six) hours as needed for wheezing or shortness of breath.   Apremilast (OTEZLA) 30 MG TABS Take 1 tablet (30 mg total) by mouth 2 (two) times daily.   aspirin EC 81 MG tablet Take 81 mg by mouth daily.   bisacodyl (DULCOLAX) 5 MG EC tablet Take 5 mg by mouth daily as needed for moderate constipation.   budesonide-formoterol (SYMBICORT) 80-4.5 MCG/ACT inhaler Inhale 2 puffs into the lungs 2 (two) times daily.   cholecalciferol (VITAMIN D3) 25 MCG (1000 UNIT) tablet Take 1,000 Units by mouth daily.   CICLOPIROX-CLOBETASOL-SAL ACID EX Apply topically.   clopidogrel (PLAVIX) 75 MG tablet Take 1 tablet (75 mg total) by mouth daily.   levothyroxine (SYNTHROID) 50 MCG tablet Take 1 tablet (50  mcg total) by mouth daily.   metFORMIN (GLUCOPHAGE) 1000 MG tablet Take 1 tablet (1,000 mg total) by mouth 2 (two) times daily with a meal.   metoprolol succinate (TOPROL-XL) 50 MG 24 hr tablet Take 1 tablet (50 mg total) by mouth daily. Take with or immediately following a meal.   Nintedanib (OFEV) 150 MG CAPS Take 1 capsule (150 mg total) by mouth 2 (two) times daily.   OTEZLA 30 MG TABS Take 1 tablet by  mouth daily.    rosuvastatin (CRESTOR) 20 MG tablet Take 1 tablet (20 mg total) by mouth daily.   telmisartan (MICARDIS) 40 MG tablet Take 1 tablet (40 mg total) by mouth daily.   No facility-administered encounter medications on file as of 06/16/2020.   Physical Exam: Tele  Data Reviewed: Imaging: High-res CT 11/19/2019-peripheral and basal subpleural reticulation with groundglass, traction bronchiectasis.  Probable honeycombing.  5 mm lung nodule.  Probable UIP pattern  CT abdomen 03/17/2020-stable pulmonary fibrosis at the base. I have reviewed the images personally  PFTs 03/18/2020 FVC 2.20 [60%], FEV1 1.65 [63%],/75, TLC 3.66 [58%], DLCO 12.0 [53%] Severe restriction, moderate-severe diffusion defect.  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  No known exposures or signs and symptoms of connective tissue disease Serologies for CTD are negative  Reviewed at multidisciplinary conference on 12/30/19.  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  Started Ofev and he is tolerating it well Hepatic function panel reviewed.  Transaminases noted to be slightly high Follow-up in 4 weeks  Plan/Recommendations: - Continue Ofev - Hepatic panel in 4 weeks  I discussed the assessment and treatment plan with the patient. The patient was provided an opportunity to ask questions and all were answered. The patient agreed with the plan and demonstrated an understanding of the instructions.   The patient was advised to call back or seek an in-person evaluation if the symptoms worsen or if the condition fails to improve as anticipated.  I provided 25 minutes of non-face-to-face time during this encounter.  Marshell Garfinkel  MD [facility] 06/16/2020, 9:30 AM  CC: [name], DO

## 2020-07-01 DIAGNOSIS — R69 Illness, unspecified: Secondary | ICD-10-CM | POA: Diagnosis not present

## 2020-07-05 ENCOUNTER — Telehealth: Payer: Self-pay | Admitting: Pulmonary Disease

## 2020-07-05 DIAGNOSIS — Z5181 Encounter for therapeutic drug level monitoring: Secondary | ICD-10-CM

## 2020-07-05 NOTE — Telephone Encounter (Signed)
Spoke with pt's son Nischal (dpr on file), verified that pt was ordered labs 4 weeks after last labs (drawn on 6/25).  Order placed for LFTs.  Nothing further needed at this time- will close encounter.

## 2020-07-06 ENCOUNTER — Other Ambulatory Visit: Payer: Self-pay

## 2020-07-06 ENCOUNTER — Ambulatory Visit (INDEPENDENT_AMBULATORY_CARE_PROVIDER_SITE_OTHER): Payer: Medicare HMO | Admitting: Pulmonary Disease

## 2020-07-06 DIAGNOSIS — Z5181 Encounter for therapeutic drug level monitoring: Secondary | ICD-10-CM

## 2020-07-06 DIAGNOSIS — J84112 Idiopathic pulmonary fibrosis: Secondary | ICD-10-CM

## 2020-07-06 DIAGNOSIS — R69 Illness, unspecified: Secondary | ICD-10-CM | POA: Diagnosis not present

## 2020-07-06 NOTE — Patient Instructions (Signed)
Continue the Ofev We will schedule hepatic panel to be done at [facility] next week  We will make an in person visit in September.

## 2020-07-06 NOTE — Progress Notes (Signed)
Virtual Visit via Telephone Note  I connected with Nicholas Fleming on 07/06/20 at  2:45 PM EDT by telephone and verified that I am speaking with the correct person using two identifiers.  Location: Patient: Home Provider: [facility], [place], [place]   I discussed the limitations, risks, security and privacy concerns of performing an evaluation and management service by telephone and the availability of in person appointments. I also discussed with the patient that there may be a patient responsible charge related to this service. The patient expressed understanding and agreed to proceed.                 Lazarus Sudbury    [id number]    [birth year]  Primary Care Physician:Hoffman, Rachel Moulds, DO  Referring Physician: [name], DO [address]  [place],  [place] [address]  Chief complaint: Follow-up for IPF  HPI: 74 year old with history of angina, hypertension, diabetes, hyperlipidemia, psoriasis.  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  Cough is nonproductive in nature associated with mild dyspnea on exertion.  He has history of angina, coronary artery disease with a stent placement many years ago.  He has longstanding psoriasis presenting as rash.  He was on methotrexate for 2 years around 2013 and self discontinued due to concern for side effects.  Recently has been started on [place] with improvement in rash.  He developed COVID-19 infection at the end of December and treated with monoclonal antibody on 12/29 with improvement in symptoms. Started on Ofev at the end of January 2021.  He is tolerating it well with no GI issues. Evaluated by GI for an unexplained 10 to 20 pound weight loss.  Underwent EGD and colonoscopy in  3/23 with findings of benign colon polyps.    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  No mold, hot tub, Jacuzzi, no down pillows or comforter ILD questionnaire 01/12/2020-negative Smoking history: Used to smoke socially.  Quit many years ago Travel history: Originally from [place].  He had lived in [place] for over [place] years and now spends his time between [place] and [place] Relevant family history: No significant family history of lung disease  Interval history: Doing well.  Tolerating Ofev without any issue. Plans to go to [place] in October  Outpatient Encounter Medications as of 07/06/2020  Medication Sig  . albuterol (PROVENTIL HFA) 108 (90 Base) MCG/ACT inhaler Inhale 1-2 puffs into the lungs every 6 (six) hours as needed for wheezing or shortness of breath.  . Apremilast (OTEZLA) 30 MG TABS Take 1 tablet (30 mg total) by mouth 2 (two) times daily.  Marland Kitchen aspirin EC 81 MG tablet Take 81 mg by mouth daily.  . bisacodyl (DULCOLAX) 5 MG EC tablet Take 5 mg by mouth daily as needed for moderate constipation.  . budesonide-formoterol (SYMBICORT) 80-4.5 MCG/ACT inhaler Inhale 2 puffs into the lungs 2 (two) times daily.  Marland Kitchen CICLOPIROX-CLOBETASOL-SAL ACID EX Apply topically.  . clopidogrel (PLAVIX) 75 MG tablet Take 1 tablet (75 mg total) by mouth daily.  Marland Kitchen levothyroxine (SYNTHROID) 50 MCG tablet Take 1 tablet (50 mcg total) by mouth daily.  . metFORMIN (GLUCOPHAGE) 1000 MG tablet Take 1 tablet (1,000 mg total) by  mouth 2 (two) times daily with a meal.  . metoprolol succinate (TOPROL-XL) 50 MG 24 hr tablet Take 1 tablet (50 mg total) by mouth daily. Take with or immediately following a meal.  . Nintedanib (OFEV) 150 MG CAPS Take 1 capsule (150 mg total) by mouth 2 (two) times daily.  . rosuvastatin (CRESTOR) 20 MG tablet Take 1 tablet (20 mg total) by mouth daily.  Marland Kitchen telmisartan (MICARDIS) 40 MG tablet Take 1 tablet (40 mg total) by mouth daily.  .  [DISCONTINUED] cholecalciferol (VITAMIN D3) 25 MCG (1000 UNIT) tablet Take 1,000 Units by mouth daily.  . [DISCONTINUED] OTEZLA 30 MG TABS Take 1 tablet by mouth daily.    No facility-administered encounter medications on file as of 07/06/2020.   Physical Exam: Tele  Data Reviewed: Imaging: High-res CT 11/19/2019-peripheral and basal subpleural reticulation with groundglass, traction bronchiectasis.  Probable honeycombing.  5 mm lung nodule.  Probable UIP pattern  CT abdomen 03/17/2020-stable pulmonary fibrosis at the base. I have reviewed the images personally  PFTs 03/18/2020 FVC 2.20 [60%], FEV1 1.65 [63%],/75, TLC 3.66 [58%], DLCO 12.0 [53%] Severe restriction, moderate-severe diffusion defect.  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  No known exposures or signs and symptoms of connective tissue disease Serologies for CTD are negative  Reviewed at multidisciplinary conference on 12/30/19.  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  Started Ofev and he is tolerating it well Hepatic function panel reviewed.  Transaminases noted to be slightly high Follow up repeat labs next week  Plan/Recommendations: - Continue Ofev - Hepatic panel in next week  I discussed the assessment and treatment plan with the patient. The patient was provided an opportunity to ask questions and all were answered. The patient agreed with the plan and demonstrated an understanding of the instructions.   The patient was advised to call back or seek an in-person evaluation if the symptoms worsen or if the condition fails to improve as anticipated.  I provided 25 minutes of non-face-to-face time during this encounter.  Marshell Garfinkel MD [facility] 07/06/2020, 3:07 PM  CC: [name], DO

## 2020-07-06 NOTE — Addendum Note (Signed)
Addended by: Merrilee Seashore on: 07/06/2020 03:49 PM   Modules accepted: Orders

## 2020-07-14 ENCOUNTER — Other Ambulatory Visit
Admission: RE | Admit: 2020-07-14 | Discharge: 2020-07-14 | Disposition: A | Discharge status: Home / Self Care | Payer: Medicare HMO | Source: Ambulatory Visit | Attending: Pulmonary Disease | Admitting: Pulmonary Disease

## 2020-07-14 DIAGNOSIS — J84112 Idiopathic pulmonary fibrosis: Secondary | ICD-10-CM | POA: Insufficient documentation

## 2020-07-14 DIAGNOSIS — Z5181 Encounter for therapeutic drug level monitoring: Secondary | ICD-10-CM | POA: Insufficient documentation

## 2020-07-14 LAB — HEPATIC FUNCTION PANEL
ALT: 82 U/L — ABNORMAL HIGH (ref 0–44)
AST: 57 U/L — ABNORMAL HIGH (ref 15–41)
Albumin: 3.7 g/dL (ref 3.5–5.0)
Alkaline Phosphatase: 65 U/L (ref 38–126)
Bilirubin, Direct: 0.1 mg/dL (ref 0.0–0.2)
Indirect Bilirubin: 0.8 mg/dL (ref 0.3–0.9)
Total Bilirubin: 0.9 mg/dL (ref 0.3–1.2)
Total Protein: 6.8 g/dL (ref 6.5–8.1)

## 2020-08-09 DIAGNOSIS — R69 Illness, unspecified: Secondary | ICD-10-CM | POA: Diagnosis not present

## 2020-08-22 DIAGNOSIS — R69 Illness, unspecified: Secondary | ICD-10-CM | POA: Diagnosis not present

## 2020-08-30 ENCOUNTER — Ambulatory Visit: Payer: Medicare HMO | Admitting: Pulmonary Disease

## 2020-08-30 ENCOUNTER — Other Ambulatory Visit: Payer: Self-pay

## 2020-08-30 ENCOUNTER — Encounter: Payer: Self-pay | Admitting: Pulmonary Disease

## 2020-08-30 VITALS — BP 122/74 | HR 80 | Temp 97.2°F | Ht 66.0 in | Wt 157.6 lb

## 2020-08-30 DIAGNOSIS — J84112 Idiopathic pulmonary fibrosis: Secondary | ICD-10-CM | POA: Diagnosis not present

## 2020-08-30 DIAGNOSIS — Z5181 Encounter for therapeutic drug level monitoring: Secondary | ICD-10-CM

## 2020-08-30 MED ORDER — OFEV 100 MG PO CAPS: 100.0000 mg | ORAL_CAPSULE | Freq: Two times a day (BID) | ORAL | 0 refills | Status: DC

## 2020-08-30 NOTE — Progress Notes (Signed)
Nicholas Fleming    [id number]    1946-05-23  Primary Care Physician:Hoffman, Rachel Moulds, DO  Referring Physician: [name], DO [address]  [place],  [place] [address]  Chief complaint: Follow-up for IPF  HPI: 74 year old with history of angina, hypertension, diabetes, hyperlipidemia, psoriasis.  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  Cough is nonproductive in nature associated with mild dyspnea on exertion.  He has history of angina, coronary artery disease with a stent placement many years ago.  He has longstanding psoriasis presenting as rash.  He was on methotrexate for 2 years around 2013 and self discontinued due to concern for side effects.  Recently has been started on [place] with improvement in rash.  He developed COVID-19 infection at the end of December and treated with monoclonal antibody on 12/29 with improvement in symptoms. Started on Ofev at the end of January 2021.  He is tolerating it well with no GI issues.  Evaluated by GI for an unexplained 10 to 20 pound weight loss.  Underwent EGD and colonoscopy in 03/09/20 with findings of benign colon polyps.    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  No mold, hot tub, Jacuzzi, no down pillows or comforter ILD questionnaire 01/12/2020-negative Smoking history: Used to smoke socially.  Quit many years ago Travel history: Originally from [place].  He had lived in [place] for over 30 years and now spends his time between [place] and [place] Relevant family history: No significant family history of lung disease  Interval history: Doing well with regard to breathing.  Tolerating Ofev well  Outpatient Encounter Medications as of  08/30/2020  Medication Sig  . albuterol (PROVENTIL HFA) 108 (90 Base) MCG/ACT inhaler Inhale 1-2 puffs into the lungs every 6 (six) hours as needed for wheezing or shortness of breath.  . Apremilast (OTEZLA) 30 MG TABS Take 1 tablet (30 mg total) by mouth 2 (two) times daily.  Marland Kitchen aspirin EC 81 MG tablet Take 81 mg by mouth daily.  . bisacodyl (DULCOLAX) 5 MG EC tablet Take 5 mg by mouth daily as needed for moderate constipation.  . budesonide-formoterol (SYMBICORT) 80-4.5 MCG/ACT inhaler Inhale 2 puffs into the lungs 2 (two) times daily.  Marland Kitchen CICLOPIROX-CLOBETASOL-SAL ACID EX Apply topically.  . clopidogrel (PLAVIX) 75 MG tablet Take 1 tablet (75 mg total) by mouth daily.  Marland Kitchen levothyroxine (SYNTHROID) 50 MCG tablet Take 1 tablet (50 mcg total) by mouth daily.  . metFORMIN (GLUCOPHAGE) 1000 MG tablet Take 1 tablet (1,000 mg total) by mouth 2 (two) times daily with a meal.  . metoprolol succinate (TOPROL-XL) 50 MG 24 hr tablet Take 1 tablet (50 mg total) by mouth daily. Take with or immediately following a meal.  . Nintedanib (OFEV) 150 MG CAPS Take 1 capsule (150 mg total) by mouth 2 (two) times daily.  . rosuvastatin (CRESTOR) 20 MG tablet Take 1 tablet (20 mg total) by mouth daily.  Marland Kitchen telmisartan (MICARDIS) 40 MG tablet Take 1 tablet (40 mg total) by mouth daily.   No facility-administered encounter medications on file as of 08/30/2020.   Physical Exam: Blood pressure 122/74, pulse 80, temperature (!) 97.2 F (36.2 C), temperature  source Oral, height 5\' 6"  (1.676 m), weight 157 lb 9.6 oz (71.5 kg), SpO2 92 %. Gen:      No acute distress HEENT:  EOMI, sclera anicteric Neck:     No masses; no thyromegaly Lungs:    Clear to auscultation bilaterally; normal respiratory effort CV:         Regular rate and rhythm; no murmurs Abd:      + bowel sounds; soft, non-tender; no palpable masses, no distension Ext:    No edema; adequate peripheral perfusion Skin:      Warm and dry; no rash Neuro: alert and  oriented x 3 Psych: normal mood and affect  Data Reviewed: Imaging: High-res CT 11/19/2019-peripheral and basal subpleural reticulation with groundglass, traction bronchiectasis.  Probable honeycombing.  5 mm lung nodule.  Probable UIP pattern  CT abdomen 03/17/2020-stable pulmonary fibrosis at the base. I have reviewed the images personally  PFTs 03/18/2020 FVC 2.20 [60%], FEV1 1.65 [63%],/75, TLC 3.66 [58%], DLCO 12.0 [53%] Severe restriction, moderate-severe diffusion defect.  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  No known exposures or signs and symptoms of connective tissue disease Serologies for CTD are negative  Reviewed at multidisciplinary conference on 12/30/19.  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  Continue Ofev.  He is tolerating it well Follow hepatic panel today.  Transaminases noted to be slightly high at last visit. Follow-up in 4 weeks  Plan/Recommendations: - Continue Ofev - Hepatic panel, proBNP  Marshell Garfinkel MD [facility] 08/30/2020, 9:58 AM  CC: [name], DO

## 2020-08-30 NOTE — Patient Instructions (Signed)
We will check some labs today including comprehensive metabolic panel and proBNP Continue the Ofev We will schedule a high-resolution CT and PFTs in January 2022 Follow-up in clinic after this test.

## 2020-08-30 NOTE — Addendum Note (Signed)
Addended byCoralie Keens on: 08/30/2020 05:03 PM   Modules accepted: Orders

## 2020-08-31 ENCOUNTER — Other Ambulatory Visit: Payer: Self-pay

## 2020-08-31 DIAGNOSIS — L409 Psoriasis, unspecified: Secondary | ICD-10-CM

## 2020-08-31 MED ORDER — OTEZLA 30 MG PO TABS: 30.0000 mg | ORAL_TABLET | Freq: Two times a day (BID) | ORAL | 0 refills | Status: DC

## 2020-08-31 NOTE — Progress Notes (Signed)
Rx refill

## 2020-09-01 ENCOUNTER — Other Ambulatory Visit
Admission: RE | Admit: 2020-09-01 | Discharge: 2020-09-01 | Disposition: A | Discharge status: Home / Self Care | Payer: Medicare HMO | Attending: Pulmonary Disease | Admitting: Pulmonary Disease

## 2020-09-01 DIAGNOSIS — J84112 Idiopathic pulmonary fibrosis: Secondary | ICD-10-CM | POA: Insufficient documentation

## 2020-09-01 LAB — COMPREHENSIVE METABOLIC PANEL
ALT: 47 U/L — ABNORMAL HIGH (ref 0–44)
AST: 38 U/L (ref 15–41)
Albumin: 3.8 g/dL (ref 3.5–5.0)
Alkaline Phosphatase: 54 U/L (ref 38–126)
Anion gap: 9 (ref 5–15)
BUN: 19 mg/dL (ref 8–23)
CO2: 26 mmol/L (ref 22–32)
Calcium: 9.2 mg/dL (ref 8.9–10.3)
Chloride: 99 mmol/L (ref 98–111)
Creatinine, Ser: 0.96 mg/dL (ref 0.61–1.24)
GFR calc Af Amer: >60 mL/min (ref >60)
GFR calc non Af Amer: >60 mL/min (ref >60)
Glucose, Bld: 121 mg/dL — ABNORMAL HIGH (ref 70–99)
Potassium: 4.3 mmol/L (ref 3.5–5.1)
Sodium: 134 mmol/L — ABNORMAL LOW (ref 135–145)
Total Bilirubin: 0.7 mg/dL (ref 0.3–1.2)
Total Protein: 7.1 g/dL (ref 6.5–8.1)

## 2020-09-01 LAB — BRAIN NATRIURETIC PEPTIDE: B Natriuretic Peptide: 43.8 pg/mL (ref 0.0–100.0)

## 2020-09-07 ENCOUNTER — Other Ambulatory Visit: Payer: Self-pay | Admitting: Internal Medicine

## 2020-09-07 MED ORDER — BUDESONIDE-FORMOTEROL FUMARATE 80-4.5 MCG/ACT IN AERO: 2.0000 | INHALATION_SPRAY | Freq: Two times a day (BID) | RESPIRATORY_TRACT | 3 refills | Status: DC

## 2020-09-07 NOTE — Progress Notes (Signed)
Needs refill of symbicort, wants 3 month supply, plans to travel to [place].

## 2020-10-06 ENCOUNTER — Encounter: Payer: Medicare HMO | Admitting: Dermatology

## 2020-12-03 IMAGING — CT CT ABD-PELV W/ CM
2 of 5 series · 16 of 46 positions shown, 18 images · IV contrast (omnipaque)
Comparison: None

CLINICAL DATA: Loss of weight.

EXAM:
CT ABDOMEN AND PELVIS WITH CONTRAST
TECHNIQUE: Multidetector CT imaging of the abdomen and pelvis was performed
using the standard protocol following bolus administration of
intravenous contrast.
CONTRAST:  100mL OMNIPAQUE IOHEXOL 300 MG/ML  SOLN

[Series 2: abd pelvis 5.00 · axial · 0.71mm/px · z∈[-1532,-1117]mm · 13 of 95 slices shown, 15 images]
[im 6/95  soft-tissue]
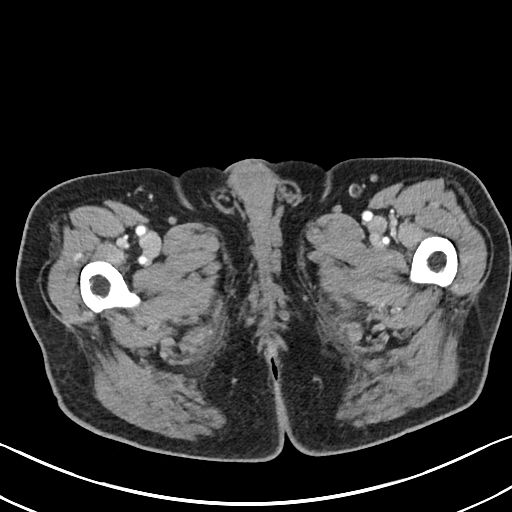
[im 6/95  bone]
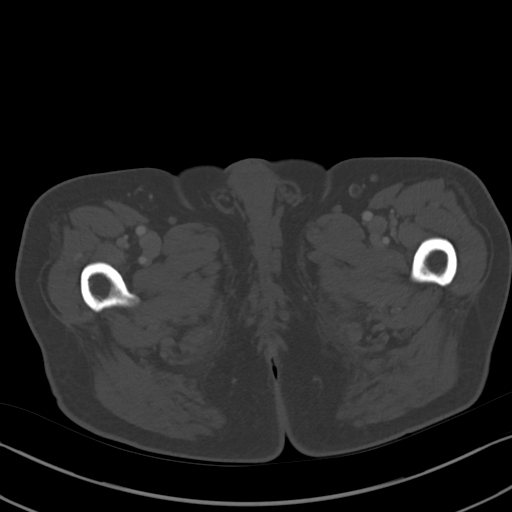
[im 12/95  soft-tissue]
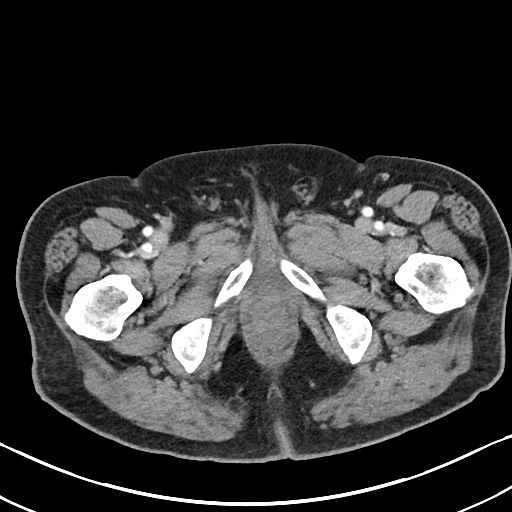
[im 23/95  soft-tissue]
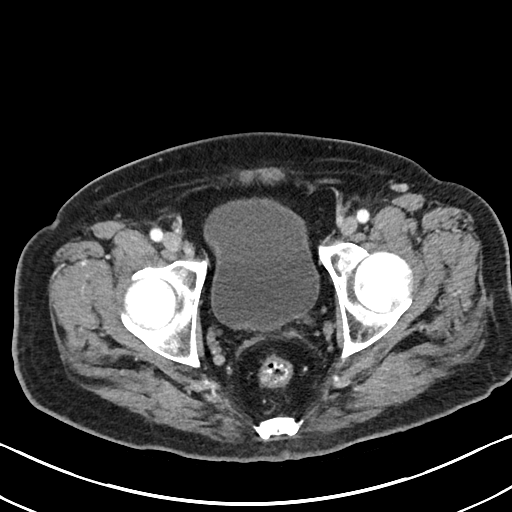
[im 28/95  soft-tissue]
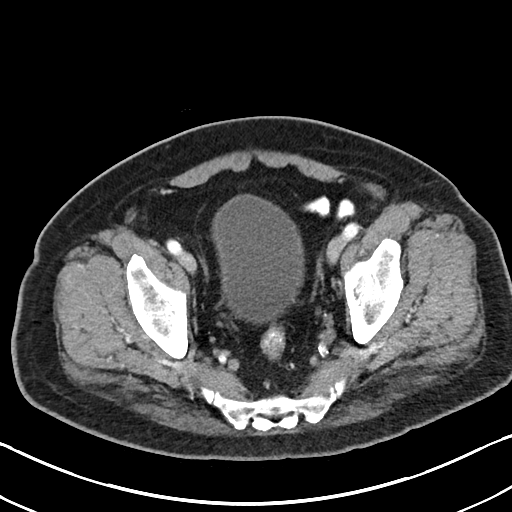
[im 34/95  soft-tissue]
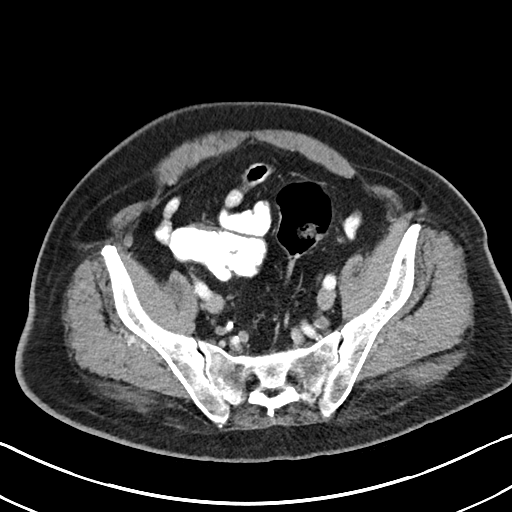
[im 39/95  soft-tissue]
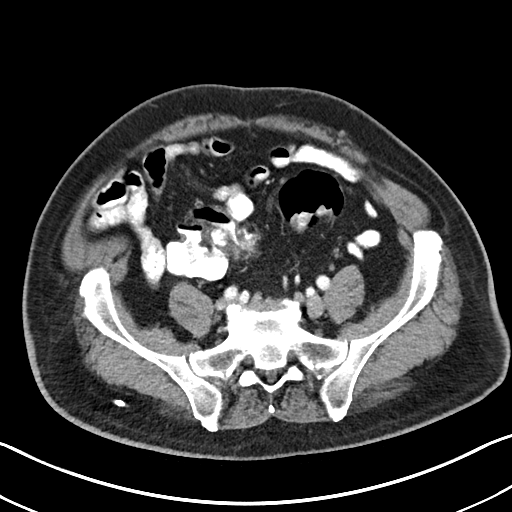
[im 50/95  soft-tissue]
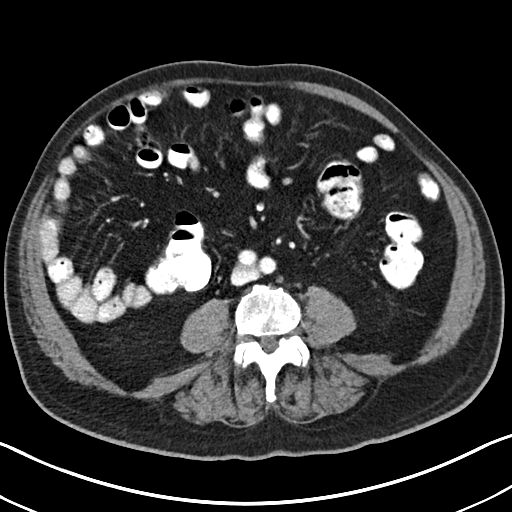
[im 56/95  soft-tissue]
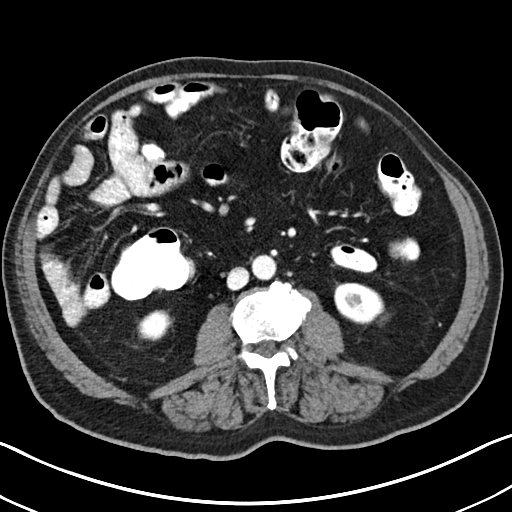
[im 61/95  soft-tissue]
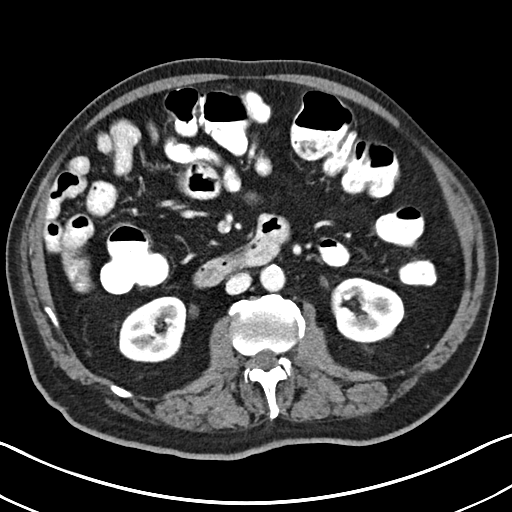
[im 61/95  bone]
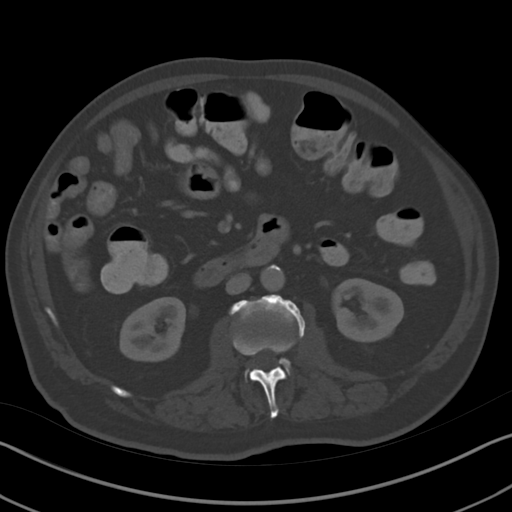
[im 67/95  soft-tissue]
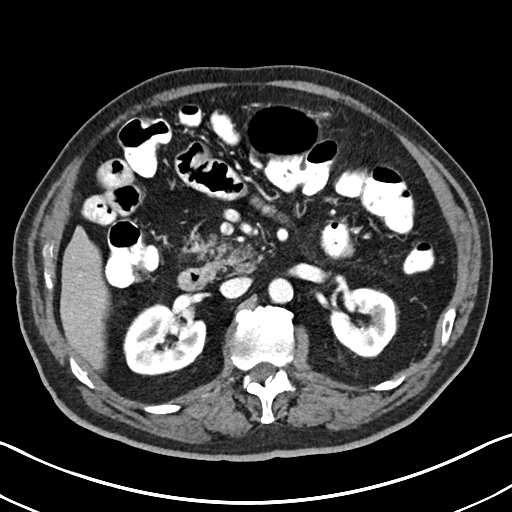
[im 72/95  soft-tissue]
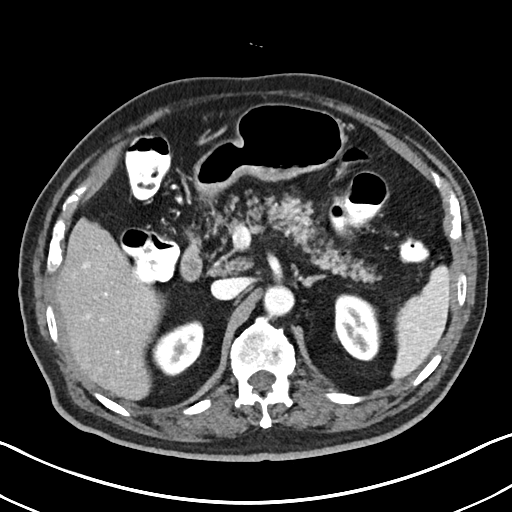
[im 83/95  soft-tissue]
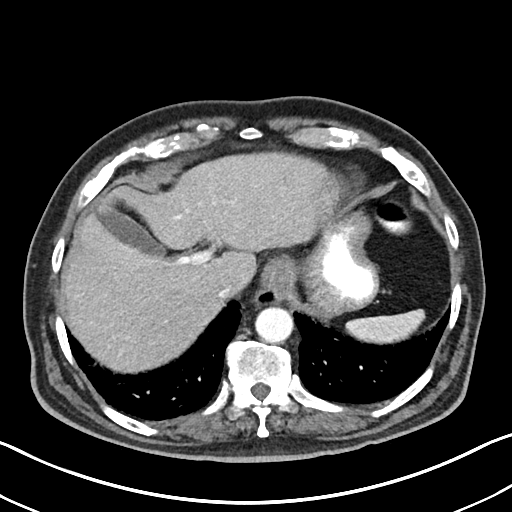
[im 89/95  soft-tissue]
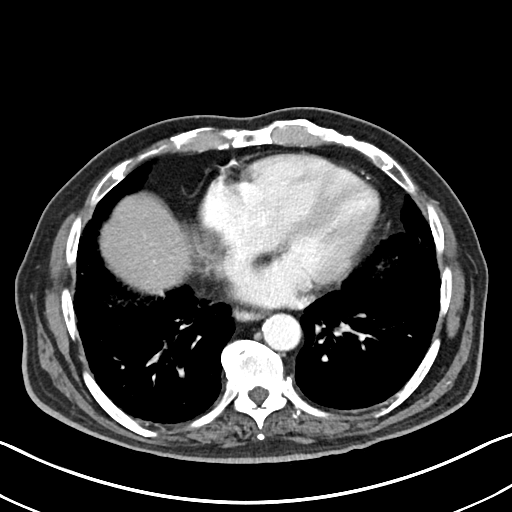

[Series 4: coronals abd pelvis 2.00 cor · coronal · 0.71mm/px · 3 of 147 slices shown]
[im 49/147  soft-tissue]
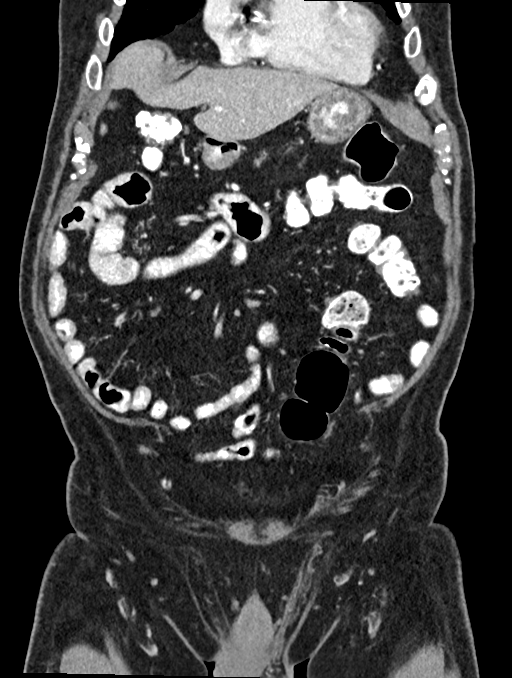
[im 65/147  soft-tissue]
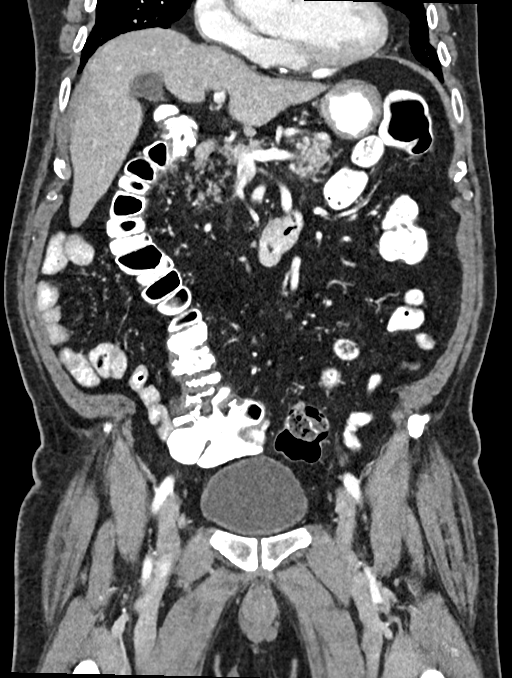
[im 82/147  soft-tissue]
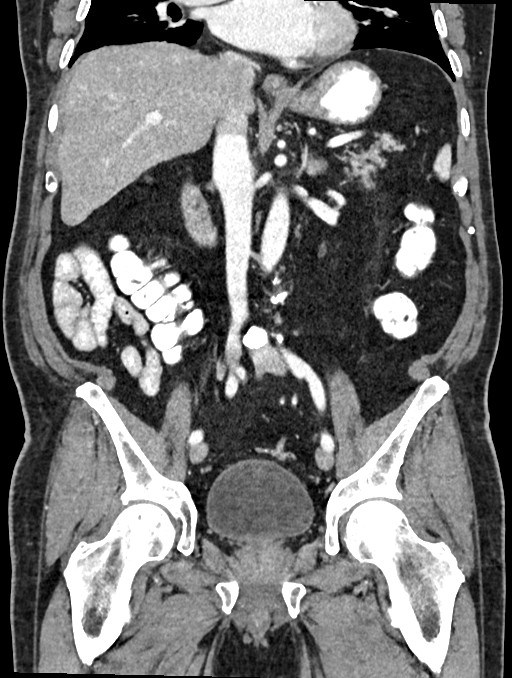

[16 of 46 positions shown; findings below may reference images not displayed]

FINDINGS: Lower chest: Chronic changes probable usual interstitial pneumonitis
(UIP) again noted. Similar appearance left lower lobe 5 mm lung
nodule, image [DATE]. No acute abnormality.

Hepatobiliary: No suspicious liver abnormality. Stone within the
neck of gallbladder measures 9 mm, image [DATE].

Pancreas: Unremarkable. No pancreatic ductal dilatation or
surrounding inflammatory changes.

Spleen: Normal in size without focal abnormality.

Adrenals/Urinary Tract: Adrenal glands are unremarkable. Kidneys are
normal, without renal calculi, focal lesion, or hydronephrosis.
Bladder is unremarkable.

Stomach/Bowel: Stomach is within normal limits. Appendix appears
normal. No evidence of bowel wall thickening, distention, or
inflammatory changes.

Vascular/Lymphatic: Aortic atherosclerosis. There is no aneurysm. No
abdominopelvic adenopathy.

Reproductive: Prostate is unremarkable.

Other: No free fluid or fluid collections.

Musculoskeletal: Lumbar degenerative disc disease. Most advanced at
L5-S1. No acute or suspicious bone findings.
IMPRESSION: 1. No acute findings within the abdomen or pelvis. No mass or
adenopathy identified to explain loss of weight and appetite.
2. Gallstone.

Aortic Atherosclerosis (ONLCM-RZ0.0).

## 2020-12-30 ENCOUNTER — Ambulatory Visit: Payer: Medicare HMO

## 2021-01-10 ENCOUNTER — Encounter: Payer: Medicare HMO | Admitting: Dermatology

## 2021-02-19 ENCOUNTER — Other Ambulatory Visit: Payer: Self-pay | Admitting: Pulmonary Disease

## 2021-02-19 DIAGNOSIS — J84112 Idiopathic pulmonary fibrosis: Secondary | ICD-10-CM

## 2021-03-04 ENCOUNTER — Ambulatory Visit: Payer: Medicare HMO

## 2021-03-29 ENCOUNTER — Ambulatory Visit
Admission: RE | Admit: 2021-03-29 | Discharge: 2021-03-29 | Disposition: A | Discharge status: Home / Self Care | Payer: Medicare HMO | Source: Ambulatory Visit | Attending: Pulmonary Disease | Admitting: Pulmonary Disease

## 2021-03-29 ENCOUNTER — Other Ambulatory Visit: Payer: Self-pay

## 2021-03-29 DIAGNOSIS — R590 Localized enlarged lymph nodes: Secondary | ICD-10-CM | POA: Diagnosis not present

## 2021-03-29 DIAGNOSIS — J84112 Idiopathic pulmonary fibrosis: Secondary | ICD-10-CM | POA: Diagnosis not present

## 2021-03-29 DIAGNOSIS — I700 Atherosclerosis of aorta: Secondary | ICD-10-CM | POA: Diagnosis not present

## 2021-03-29 DIAGNOSIS — I2510 Atherosclerotic heart disease of native coronary artery without angina pectoris: Secondary | ICD-10-CM | POA: Diagnosis not present

## 2021-03-29 DIAGNOSIS — J479 Bronchiectasis, uncomplicated: Secondary | ICD-10-CM | POA: Diagnosis not present

## 2021-04-04 ENCOUNTER — Ambulatory Visit: Payer: Medicare HMO | Admitting: Dermatology

## 2021-04-07 ENCOUNTER — Other Ambulatory Visit: Payer: Self-pay

## 2021-04-07 ENCOUNTER — Ambulatory Visit: Payer: Medicare HMO | Admitting: Dermatology

## 2021-04-07 DIAGNOSIS — L409 Psoriasis, unspecified: Secondary | ICD-10-CM

## 2021-04-07 DIAGNOSIS — E119 Type 2 diabetes mellitus without complications: Secondary | ICD-10-CM

## 2021-04-07 NOTE — Patient Instructions (Addendum)
Reviewed risks of biologics including immunosuppression, infections, injection site reaction, and failure to improve condition. Goal is control of skin condition, not cure.  Some older biologics such as Humira and Enbrel may slightly increase risk of malignancy and may worsen congestive heart failure. The use of biologics requires long term medication management, including periodic office visits and monitoring of blood work.   

## 2021-04-07 NOTE — Progress Notes (Signed)
   Follow-Up Visit   Subjective  Nicholas Fleming is a 75 y.o. male who presents for the following: Psoriasis (Pt here for psoriasis flare on the hands, knees, pt stopped [place] about 8 months ago because of side effect weight loss. ). Pt has pulmonary fibrosis being treated by Dr Vaughan Browner  Daughter in law with pt contributing to history   The following portions of the chart were reviewed this encounter and updated as appropriate:   Tobacco  Allergies  Meds  Problems  Med Hx  Surg Hx  Fam Hx     Review of Systems:  No other skin or systemic complaints except as noted in HPI or Assessment and Plan.  Objective  Well appearing patient in no apparent distress; mood and affect are within normal limits.  A focused examination was performed including face, hands, knees . Relevant physical exam findings are noted in the Assessment and Plan.  Objective  dorum hands, knee, feet, bilateral elbows: Well-demarcated erythematous papules/plaques with silvery scale.  ~12 cm plaque at right elbow   trunk was flared years ago but now clear   BSA 12%    Images             Assessment & Plan  Psoriasis -severe with BSA of at least 12%.  Patient could not continue taking oral systemic Otezla treatment for psoriasis as he was having the side effect of weight loss. Consider systemic biologic injection treatment with Skyrizi pending Dr. Matilde Bash approval (Dr. Vaughan Browner follows patient for idiopathic pulmonary fibrosis) dorum hands, knee, feet, bilateral elbows  Psoriasis is a chronic non-curable, but treatable genetic/hereditary disease that may have other systemic features affecting other organ systems such as joints (Psoriatic Arthritis). It is associated with an increased risk of inflammatory bowel disease, heart disease, non-alcoholic fatty liver disease, and depression.     Reviewed risks of biologics including immunosuppression, infections, injection site reaction, and failure to  improve condition. Goal is control of skin condition, not cure.  Some older biologics such as Humira and Enbrel may slightly increase risk of malignancy and may worsen congestive heart failure. The use of biologics requires long term medication management, including periodic office visits and monitoring of blood work.   Plan on starting [facility] pending approval from Dr Vaughan Browner pulmonary doctor, pending normal labs results  Labs ordered   Other Related Procedures Comprehensive metabolic panel CBC with Differential/Platelet Hepatitis B surface antibody,qualitative Hepatitis B surface antigen Hepatitis C antibody HIV Antibody (routine testing w rflx) QuantiFERON-TB Gold Plus  Return in about 1 month (around 05/07/2021) for Psoriasis .  IMarye Round, CMA, am acting as scribe for Sarina Ser, MD .  Documentation: I have reviewed the above documentation for accuracy and completeness, and I agree with the above.  Sarina Ser, MD

## 2021-04-11 ENCOUNTER — Encounter: Payer: Self-pay | Admitting: Dermatology

## 2021-04-11 ENCOUNTER — Telehealth: Payer: Self-pay | Admitting: Pulmonary Disease

## 2021-04-11 LAB — CBC WITH DIFFERENTIAL/PLATELET
Basophils Absolute: 0.1 10*3/uL (ref 0.0–0.2)
Basos: 1 %
EOS (ABSOLUTE): 0.9 10*3/uL — ABNORMAL HIGH (ref 0.0–0.4)
Eos: 7 %
Hematocrit: 48.8 % (ref 37.5–51.0)
Hemoglobin: 16.5 g/dL (ref 13.0–17.7)
Immature Grans (Abs): 0 10*3/uL (ref 0.0–0.1)
Immature Granulocytes: 0 %
Lymphocytes Absolute: 2.3 10*3/uL (ref 0.7–3.1)
Lymphs: 19 %
MCH: 33.3 pg — ABNORMAL HIGH (ref 26.6–33.0)
MCHC: 33.8 g/dL (ref 31.5–35.7)
MCV: 99 fL — ABNORMAL HIGH (ref 79–97)
Monocytes Absolute: 0.9 10*3/uL (ref 0.1–0.9)
Monocytes: 8 %
Neutrophils Absolute: 8 10*3/uL — ABNORMAL HIGH (ref 1.4–7.0)
Neutrophils: 65 %
Platelets: 232 10*3/uL (ref 150–450)
RBC: 4.95 x10E6/uL (ref 4.14–5.80)
RDW: 12.4 % (ref 11.6–15.4)
WBC: 12.3 10*3/uL — ABNORMAL HIGH (ref 3.4–10.8)

## 2021-04-11 LAB — COMPREHENSIVE METABOLIC PANEL
ALT: 14 IU/L (ref 0–44)
AST: 19 IU/L (ref 0–40)
Albumin/Globulin Ratio: 1.6 (ref 1.2–2.2)
Albumin: 4 g/dL (ref 3.7–4.7)
Alkaline Phosphatase: 50 IU/L (ref 44–121)
BUN/Creatinine Ratio: 12 (ref 10–24)
BUN: 11 mg/dL (ref 8–27)
Bilirubin Total: 0.6 mg/dL (ref 0.0–1.2)
CO2: 22 mmol/L (ref 20–29)
Calcium: 9.1 mg/dL (ref 8.6–10.2)
Chloride: 100 mmol/L (ref 96–106)
Creatinine, Ser: 0.95 mg/dL (ref 0.76–1.27)
Globulin, Total: 2.5 g/dL (ref 1.5–4.5)
Glucose: 132 mg/dL — ABNORMAL HIGH (ref 65–99)
Potassium: 4.4 mmol/L (ref 3.5–5.2)
Sodium: 139 mmol/L (ref 134–144)
Total Protein: 6.5 g/dL (ref 6.0–8.5)
eGFR: 83 mL/min/{1.73_m2} (ref >59)

## 2021-04-11 LAB — HEPATITIS B SURFACE ANTIBODY,QUALITATIVE: Hep B Surface Ab, Qual: NONREACTIVE

## 2021-04-11 LAB — HEPATITIS B SURFACE ANTIGEN: Hepatitis B Surface Ag: NEGATIVE

## 2021-04-11 LAB — HIV ANTIBODY (ROUTINE TESTING W REFLEX): HIV Screen 4th Generation wRfx: NONREACTIVE

## 2021-04-11 LAB — HEPATITIS C ANTIBODY: Hep C Virus Ab: <0.1 s/co ratio (ref 0.0–0.9)

## 2021-04-11 LAB — QUANTIFERON-TB GOLD PLUS

## 2021-04-11 NOTE — Telephone Encounter (Signed)
Marshell Garfinkel, MD  Elton Sin, LPN Can you make a follow up to see me in clinic at next available?    Called and spoke with Patient's son Nischal (DPR).  Dr. Matilde Bash recommendations for follow up appointment given. Niscahal stated he would have to check schedule and call back to schedule Patient's follow up OV with Dr. Vaughan Browner. At this time first available is 05/30/21 at 0900.

## 2021-04-12 ENCOUNTER — Other Ambulatory Visit: Payer: Self-pay

## 2021-04-12 ENCOUNTER — Telehealth: Payer: Self-pay

## 2021-04-12 DIAGNOSIS — Z79899 Other long term (current) drug therapy: Secondary | ICD-10-CM

## 2021-04-12 DIAGNOSIS — L400 Psoriasis vulgaris: Secondary | ICD-10-CM

## 2021-04-12 NOTE — Telephone Encounter (Signed)
Patient's son informed of lab results and advised that patient will need to have his blood drawn again for the Quanitferon test (TB). I advised Nischal that I can mail the requisition to their home so he won't have to come in the office to pick it up. I also discussed with Nischal that we are waiting to hear back from his pulmonologist before deciding whether to start [facility].

## 2021-04-12 NOTE — Telephone Encounter (Signed)
-----   Message from Ralene Bathe, MD sent at 04/11/2021  5:56 PM EDT ----- All lab OK, but TB/Quantiferon Gold not performed due to "inadequate plasma" - needs re-drawn and test needs performed - Call lab and patient and determine what needs done to get this lab performed. Chemistry and CBC OK. Hepatitis B and C studies normal HIV negative / normal  Please contact Dr Matilde Bash office (pulmonologist who follows pt for Idiopathic Pulmonary Fibrosis) and ask him to review pts note and comment whether there is any contraindication to pt starting Skyrizi due to his idiopathic pulmonary fibrosis.  Once QuantiFERON gold is performed and if negative: And if Dr. Vaughan Browner approves [place] pursuing Skyrizi for patient we can get him started.

## 2021-04-13 NOTE — Telephone Encounter (Signed)
Message routed to [facility] front desk pool to help assist in Patient scheduling.

## 2021-04-18 ENCOUNTER — Other Ambulatory Visit: Payer: Self-pay | Admitting: Internal Medicine

## 2021-04-18 MED ORDER — METFORMIN HCL 1000 MG PO TABS: 1000.0000 mg | ORAL_TABLET | Freq: Two times a day (BID) | ORAL | 3 refills | Status: DC

## 2021-04-18 MED ORDER — METOPROLOL SUCCINATE ER 50 MG PO TB24: 50.0000 mg | ORAL_TABLET | Freq: Every day | ORAL | 3 refills | Status: DC

## 2021-04-18 MED ORDER — BUDESONIDE-FORMOTEROL FUMARATE 80-4.5 MCG/ACT IN AERO: 2.0000 | INHALATION_SPRAY | Freq: Two times a day (BID) | RESPIRATORY_TRACT | 3 refills | Status: DC

## 2021-04-18 MED ORDER — ROSUVASTATIN CALCIUM 20 MG PO TABS: 20.0000 mg | ORAL_TABLET | Freq: Every day | ORAL | 3 refills | Status: DC

## 2021-04-18 MED ORDER — LEVOTHYROXINE SODIUM 50 MCG PO TABS: 50.0000 ug | ORAL_TABLET | Freq: Every day | ORAL | 3 refills | Status: DC

## 2021-04-18 MED ORDER — TELMISARTAN 40 MG PO TABS: 40.0000 mg | ORAL_TABLET | Freq: Every day | ORAL | 3 refills | Status: DC

## 2021-04-18 MED ORDER — CLOPIDOGREL BISULFATE 75 MG PO TABS: 75.0000 mg | ORAL_TABLET | Freq: Every day | ORAL | 3 refills | Status: DC

## 2021-04-18 NOTE — Progress Notes (Signed)
Due for medication refills, needs visit with me for routine follow up, will plan to schedule in next 1-2 months.

## 2021-04-19 ENCOUNTER — Telehealth: Payer: Self-pay

## 2021-04-19 NOTE — Telephone Encounter (Signed)
-----   Message from Ralene Bathe, MD sent at 04/18/2021  8:53 AM EDT ----- Regarding: FW: Skyrizi and idiopathic pulmonary fibrosis  ----- Message ----- From: Marshell Garfinkel, MD Sent: 04/15/2021  12:42 PM EDT To: Ralene Bathe, MD, Rosaria Ferries, CMA Subject: RE: Nicholas Fleming and idiopathic pulmonary fibrosis  There is no contraindication to [facility] with IPF and his treatment.  Thanks  ----- Message ----- From: Rosaria Ferries, CMA Sent: 04/12/2021   3:16 PM EDT To: Ralene Bathe, MD, Marshell Garfinkel, MD Subject: Nicholas Fleming and idiopathic pulmonary fibrosis      Good afternoon Dr. Vaughan Browner,   I work at [facility] in [place] with Dr. Sarina Ser and we share a mutual patient [name]. Dr. Nehemiah Massed wanted me to reach out to you and ask if you could review the patient's note and comment whether there is any contraindication to him starting Skyrizi and having idiopathic pulmonary fibrosis. Thank you for your time. If you have any questions or concerns please reach out to [place] at [phone].

## 2021-04-26 DIAGNOSIS — L400 Psoriasis vulgaris: Secondary | ICD-10-CM | POA: Diagnosis not present

## 2021-04-26 DIAGNOSIS — Z79899 Other long term (current) drug therapy: Secondary | ICD-10-CM | POA: Diagnosis not present

## 2021-04-29 NOTE — Telephone Encounter (Signed)
Patient scheduled 05/02/21 with Dr. Vaughan Browner at [phone].

## 2021-04-30 LAB — QUANTIFERON-TB GOLD PLUS
QuantiFERON Mitogen Value: >10 IU/mL
QuantiFERON Nil Value: 0.1 IU/mL
QuantiFERON TB1 Ag Value: 0.11 IU/mL
QuantiFERON TB2 Ag Value: 0.11 IU/mL
QuantiFERON-TB Gold Plus: NEGATIVE

## 2021-05-02 ENCOUNTER — Encounter: Payer: Self-pay | Admitting: Pulmonary Disease

## 2021-05-02 ENCOUNTER — Ambulatory Visit: Payer: Medicare HMO | Admitting: Pulmonary Disease

## 2021-05-02 ENCOUNTER — Other Ambulatory Visit: Payer: Self-pay

## 2021-05-02 ENCOUNTER — Other Ambulatory Visit: Payer: Self-pay | Admitting: Pulmonary Disease

## 2021-05-02 VITALS — BP 122/68 | HR 74 | Temp 97.6°F | Ht 66.0 in | Wt 163.8 lb

## 2021-05-02 DIAGNOSIS — Z5181 Encounter for therapeutic drug level monitoring: Secondary | ICD-10-CM

## 2021-05-02 DIAGNOSIS — J84112 Idiopathic pulmonary fibrosis: Secondary | ICD-10-CM

## 2021-05-02 NOTE — Addendum Note (Signed)
Addended by: Elton Sin on: 05/02/2021 12:19 PM   Modules accepted: Orders

## 2021-05-02 NOTE — Progress Notes (Signed)
Nicholas Fleming    [id number]    1946/10/17  Primary Care Physician:Hoffman, Rachel Moulds, DO  Referring Physician: [name], DO [address]  [name],  [name] [address]  Chief complaint: Follow-up for IPF  HPI: 75 year old with history of angina, hypertension, diabetes, hyperlipidemia, psoriasis.  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  Cough is nonproductive in nature associated with mild dyspnea on exertion.  He has history of angina, coronary artery disease with a stent placement many years ago.  He has longstanding psoriasis presenting as rash.  He was on methotrexate for 2 years around 2013 and self discontinued due to concern for side effects.  Recently has been started on [place] with improvement in rash.  He developed COVID-19 infection at the end of December and treated with monoclonal antibody on 12/29 with improvement in symptoms. Started on Ofev at the end of January 2021.  He is tolerating it well with no GI issues.  Evaluated by GI for an unexplained 10 to 20 pound weight loss.  Underwent EGD and colonoscopy in 03/09/20 with findings of benign colon polyps.    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  No mold, hot tub, Jacuzzi, no down pillows or comforter ILD questionnaire 01/12/2020-negative Smoking history: Used to smoke socially.  Quit many years ago Travel history: Originally from [place].  He had lived in [place] for over 30 years and now spends his time between [place] and [place] Relevant family history: No significant family history of lung disease  Interval history: He had been in [place] for the past 8 months and returns today for follow-up States that he is tolerating  Ofev well without any issues CT shows minimal progression  Recent hepatic panel is normal  Outpatient Encounter Medications as of 05/02/2021  Medication Sig  . albuterol (PROVENTIL HFA) 108 (90 Base) MCG/ACT inhaler Inhale 1-2 puffs into the lungs every 6 (six) hours as needed for wheezing or shortness of breath.  Marland Kitchen aspirin EC 81 MG tablet Take 81 mg by mouth daily.  . bisacodyl (DULCOLAX) 5 MG EC tablet Take 5 mg by mouth daily as needed for moderate constipation.  . budesonide-formoterol (SYMBICORT) 80-4.5 MCG/ACT inhaler Inhale 2 puffs into the lungs 2 (two) times daily.  Marland Kitchen CICLOPIROX-CLOBETASOL-SAL ACID EX Apply topically.  . clopidogrel (PLAVIX) 75 MG tablet Take 1 tablet (75 mg total) by mouth daily.  Marland Kitchen levothyroxine (SYNTHROID) 50 MCG tablet Take 1 tablet (50 mcg total) by mouth daily.  . metFORMIN (GLUCOPHAGE) 1000 MG tablet Take 1 tablet (1,000 mg total) by mouth 2 (two) times daily with a meal.  . metoprolol succinate (TOPROL-XL) 50 MG 24 hr tablet Take 1 tablet (50 mg total) by mouth daily. Take with or immediately following a meal.  . Nintedanib (OFEV) 100 MG CAPS Take 1 capsule (100 mg total) by mouth 2 (two) times daily.  Marland Kitchen OFEV 150 MG CAPS TAKE 1 CAPSULE TWICE A DAY  . rosuvastatin (CRESTOR) 20 MG tablet Take 1 tablet (20 mg total) by mouth daily.  Marland Kitchen telmisartan (MICARDIS) 40 MG tablet Take 1 tablet (40 mg total) by mouth daily.   No facility-administered encounter medications on file as  of 05/02/2021.   Physical Exam: Blood pressure 122/68, pulse 74, temperature 97.6 F (36.4 C), temperature source Temporal, height 5\' 6"  (1.676 m), weight 163 lb 12.8 oz (74.3 kg), SpO2 99 %. Gen:      No acute distress HEENT:  EOMI, sclera anicteric Neck:     No masses; no thyromegaly Lungs:    Bibasal crackles CV:         Regular rate and rhythm; no murmurs Abd:      + bowel sounds; soft, non-tender; no palpable masses, no distension Ext:    No edema; adequate peripheral  perfusion Skin:      Warm and dry; no rash Neuro: alert and oriented x 3 Psych: normal mood and affect  Data Reviewed: Imaging: High-res CT 11/19/2019-peripheral and basal subpleural reticulation with groundglass, traction bronchiectasis.  Probable honeycombing.  5 mm lung nodule.  Probable UIP pattern  CT abdomen 03/17/2020-stable pulmonary fibrosis at the base.  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  No known exposures or signs and symptoms of connective tissue disease Serologies for CTD are negative  Reviewed at multidisciplinary conference on 12/30/19.  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  Continue Ofev.  He is tolerating it well Recent hepatic panel is normal  Schedule PFTs in the next 3 months  Plan/Recommendations: - Continue Ofev - PFTs  Marshell Garfinkel MD [facility] 05/02/2021, 11:40 AM  CC: [name], DO

## 2021-05-02 NOTE — Patient Instructions (Addendum)
I am glad you are doing well with your breathing Your CT shows minimal progression Continue Ofev We will schedule pulmonary function test in the next 3 to 4 months at [facility] if possible  Follow-up in clinic after PFTs

## 2021-05-03 ENCOUNTER — Telehealth: Payer: Self-pay

## 2021-05-03 DIAGNOSIS — L400 Psoriasis vulgaris: Secondary | ICD-10-CM

## 2021-05-03 MED ORDER — SKYRIZI (150 MG DOSE) 75 MG/0.83ML ~~LOC~~ PSKT: 150.0000 mg | PREFILLED_SYRINGE | SUBCUTANEOUS | 1 refills | Status: DC

## 2021-05-03 NOTE — Telephone Encounter (Signed)
Patient's son Nicholas Fleming informed of pathology results and that Nicholas Fleming will be sent to [facility] so expect a phone call from them sometime this week. Advised son that process can take several weeks to be approved through his insurance company.

## 2021-05-03 NOTE — Telephone Encounter (Signed)
-----   Message from Ralene Bathe, MD sent at 05/02/2021 12:48 PM EDT ----- Nicholas Fleming (TB test) - negative from 04/26/2021 Dr Vaughan Browner says there is no contraindication to [facility] for pts Psoriasis in light of pts Idiopathic Pulmonary Fibrosis. We will initiate systemic biologic Skyrizi injections as soon as Biochemist, clinical. (Mas - Please let me know about where we are with insurance approval process for [name])

## 2021-05-10 ENCOUNTER — Other Ambulatory Visit: Payer: Self-pay

## 2021-05-10 DIAGNOSIS — L400 Psoriasis vulgaris: Secondary | ICD-10-CM

## 2021-05-10 MED ORDER — SKYRIZI 150 MG/ML ~~LOC~~ SOSY: 150.0000 mg | PREFILLED_SYRINGE | SUBCUTANEOUS | 1 refills | Status: DC

## 2021-05-10 NOTE — Progress Notes (Signed)
Pharmacy requested dosage update

## 2021-05-11 ENCOUNTER — Other Ambulatory Visit: Payer: Self-pay

## 2021-05-11 ENCOUNTER — Telehealth: Payer: Self-pay

## 2021-05-11 ENCOUNTER — Ambulatory Visit: Payer: Medicare HMO | Admitting: Dermatology

## 2021-05-11 NOTE — Telephone Encounter (Signed)
Patient's Skyrizi not yet approved. Called and left message on son's voicemail to reschedule today's appointment.

## 2021-05-30 ENCOUNTER — Ambulatory Visit: Payer: Medicare HMO | Admitting: Dermatology

## 2021-05-30 ENCOUNTER — Other Ambulatory Visit: Payer: Self-pay

## 2021-05-30 DIAGNOSIS — L409 Psoriasis, unspecified: Secondary | ICD-10-CM | POA: Diagnosis not present

## 2021-05-30 MED ORDER — CLOBETASOL PROPIONATE 0.05 % EX OINT: 1.0000 "application " | TOPICAL_OINTMENT | Freq: Two times a day (BID) | CUTANEOUS | 1 refills | Status: DC | PRN

## 2021-05-30 MED ORDER — RISANKIZUMAB-RZAA 150 MG/ML ~~LOC~~ SOSY
150.0000 mg | PREFILLED_SYRINGE | Freq: Once | SUBCUTANEOUS | Status: AC
  Administered 2021-05-30: 150 mg via SUBCUTANEOUS

## 2021-05-30 NOTE — Patient Instructions (Addendum)
Topical steroids (such as triamcinolone, fluocinolone, fluocinonide, mometasone, clobetasol, halobetasol, betamethasone, hydrocortisone) can cause thinning and lightening of the skin if they are used for too long in the same area. Your physician has selected the right strength medicine for your problem and area affected on the body. Please use your medication only as directed by your physician to prevent side effects.    Reviewed risks of biologics including immunosuppression, infections, injection site reaction, and failure to improve condition. Goal is control of skin condition, not cure.  Some older biologics such as Humira and Enbrel may slightly increase risk of malignancy and may worsen congestive heart failure. The use of biologics requires long term medication management, including periodic office visits and monitoring of blood work.  If you have any questions or concerns for your doctor, please call our main line at [phone] and press option 4 to reach your doctor's medical assistant. If no one answers, please leave a voicemail as directed and we will return your call as soon as possible. Messages left after 4 pm will be answered the following business day.   You may also send [place] a message via [place]. We typically respond to MyChart messages within 1-2 business days.  For prescription refills, please ask your pharmacy to contact our office. Our fax number is [phone].  If you have an urgent issue when the clinic is closed that cannot wait until the next business day, you can page your doctor at the number below.    Please note that while we do our best to be available for urgent issues outside of office hours, we are not available 24/7.   If you have an urgent issue and are unable to reach [place], you may choose to seek medical care at your doctor's office, retail clinic, urgent care center, or emergency room.  If you have a medical emergency, please immediately call 911 or go to the  emergency department.  Pager Numbers  - Dr. Nehemiah Massed: [phone]  - Dr. Laurence Ferrari: [phone]  - Dr. Nicole Kindred: [phone]  In the event of inclement weather, please call our main line at [phone] for an update on the status of any delays or closures.  Dermatology Medication Tips: Please keep the boxes that topical medications come in in order to help keep track of the instructions about where and how to use these. Pharmacies typically print the medication instructions only on the boxes and not directly on the medication tubes.   If your medication is too expensive, please contact our office at [phone] option 4 or send [place] a message through [place].   We are unable to tell what your co-pay for medications will be in advance as this is different depending on your insurance coverage. However, we may be able to find a substitute medication at lower cost or fill out paperwork to get insurance to cover a needed medication.   If a prior authorization is required to get your medication covered by your insurance company, please allow [place] 1-2 business days to complete this process.  Drug prices often vary depending on where the prescription is filled and some pharmacies may offer cheaper prices.  The website www.goodrx.com contains coupons for medications through different pharmacies. The prices here do not account for what the cost may be with help from insurance (it may be cheaper with your insurance), but the website can give you the price if you did not use any insurance.  - You can print the associated coupon and take it with  your prescription to the pharmacy.  - You may also stop by our office during regular business hours and pick up a GoodRx coupon card.  - If you need your prescription sent electronically to a different pharmacy, notify our office through [facility] or by phone at [phone] option 4.

## 2021-05-30 NOTE — Progress Notes (Signed)
   Follow-Up Visit   Subjective  Nicholas Fleming is a 75 y.o. male who presents for the following: Psoriasis (Psoriasis follow up and here today for follow up psoriasis. He was approved by pcp to start skyrizi to treat psoriasis. He is here today with daughter in law to have first injection. ). The patient has idiopathic pulmonary fibrosis, and his pulmonologist has declared that Nicholas Fleming is not contraindicated with his pulmonary fibrosis.  The following portions of the chart were reviewed this encounter and updated as appropriate:  Tobacco  Allergies  Meds  Problems  Med Hx  Surg Hx  Fam Hx      Objective  Well appearing patient in no apparent distress; mood and affect are within normal limits.  A focused examination was performed including bilateral hands, elbows, abdomen. Relevant physical exam findings are noted in the Assessment and Plan.  Trunk, extremities Psoriatic plaques  Assessment & Plan  Psoriasis -severe and not responding to topical treatment.  Patient will start Nicholas Fleming today. Nicholas Fleming has no contraindication to the patient's idiopathic pulmonary fibrosis according to his pulmonologist) Trunk, extremities Psoriasis is a chronic non-curable, but treatable genetic/hereditary disease that may have other systemic features affecting other organ systems such as joints (Psoriatic Arthritis). It is associated with an increased risk of inflammatory bowel disease, heart disease, non-alcoholic fatty liver disease, and depression.    Chronic condition with duration or expected duration over one year. Condition is bothersome to patient. Currently flared.  Reviewed labs - normal  Skyrizi 150 mg injected to left lower abdomen today. Patient tolerated well. Lot #[id number] Exp 09/2022  Information given to reach out to nurse ambassador   Follow up in 1 month   Clobetasol ointment 0.05 % ointment apply to affected areas 2 times daily as needed. Use for up to 5 days. Avoid applying  to face, groin, and axilla. Use as directed. Risk of skin atrophy with long-term use reviewed.   Topical steroids (such as triamcinolone, fluocinolone, fluocinonide, mometasone, clobetasol, halobetasol, betamethasone, hydrocortisone) can cause thinning and lightening of the skin if they are used for too long in the same area. Your physician has selected the right strength medicine for your problem and area affected on the body. Please use your medication only as directed by your physician to prevent side effects.   Risankizumab-rzaa SOSY 150 mg - Trunk, extremities  clobetasol ointment (TEMOVATE) 0.05 % - Trunk, extremities Apply 1 application topically 2 (two) times daily as needed. Use for up to 5 days. Avoid applying to face, groin, and axilla. Use as directed.  Return in about 1 month (around 06/29/2021) for skyrizi shot . Nicholas Fleming, CMA, am acting as scribe for Sarina Ser, MD.  Documentation: I have reviewed the above documentation for accuracy and completeness, and I agree with the above.  Sarina Ser, MD

## 2021-05-31 ENCOUNTER — Encounter: Payer: Self-pay | Admitting: Dermatology

## 2021-06-17 ENCOUNTER — Telehealth: Payer: Self-pay | Admitting: Cardiology

## 2021-06-21 ENCOUNTER — Encounter: Payer: Self-pay | Admitting: *Deleted

## 2021-06-21 NOTE — Telephone Encounter (Signed)
Not urgent, just routine so anything is fine. Ask Celeste to prep chart

## 2021-06-29 ENCOUNTER — Other Ambulatory Visit: Payer: Self-pay

## 2021-06-29 ENCOUNTER — Ambulatory Visit (INDEPENDENT_AMBULATORY_CARE_PROVIDER_SITE_OTHER): Payer: Medicare HMO

## 2021-06-29 DIAGNOSIS — L400 Psoriasis vulgaris: Secondary | ICD-10-CM

## 2021-06-29 MED ORDER — RISANKIZUMAB-RZAA 150 MG/ML ~~LOC~~ SOSY
150.0000 mg | PREFILLED_SYRINGE | Freq: Once | SUBCUTANEOUS | Status: AC
  Administered 2021-06-29: 150 mg via SUBCUTANEOUS

## 2021-06-29 NOTE — Progress Notes (Signed)
Patient here for 4 week injection of Skyrizi.   Skyrizi injected into right lower abdomen. Patient tolerated well.  LOT: [id number] EXP: 09/2022

## 2021-07-11 NOTE — Progress Notes (Signed)
Primary Physician/Referring:  [name], DO  Patient ID: Nicholas Fleming, male    DOB: [birth year], 75 y.o.   MRN: [id number]  Chief Complaint  Patient presents with   Coronary Artery Disease   IPF (idiopathic pulmonary fibrosis)   New Patient (Initial Visit)   Hypertension   HPI:    Nicholas Fleming  is a 75 y.o. 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 Patient has history of Covid-19 infection 11/2020. Recent chest CT also revealed aortic atherosclerosis in addition to left main and three-vessel coronary artery calcification. Patient is referred to our office to establish cardiovascular care in the [place].  Patient is accompanied by his son who is an internal medicine doctor.    Patient states that in 2015 he was having dyspnea on exertion and underwent stress testing in [place] which was abnormal, which subsequently led to PCI in 2015.  He has not regularly followed up with cardiologist in [place] since this event.  Notably patient travels back and forth between the [place] regularly.  Overall patient is presently feeling well, however he does report mild dyspnea on exertion particularly when walking uphill and has not been exercising much over the last 1 year although he previously walked on a daily basis.  He follows closely with pulmonology for management of IPF, which has remained stable over the last year.  Patient denies chest pain, palpitations, orthopnea, PND, syncope, near syncope, dizziness.  Past Medical History:  Diagnosis Date   Allergy    Asthma    Bronchitis    Controlled type 2 diabetes mellitus ([place]) 10/09/2019   Essential hypertension 10/09/2019   Hypothyroidism 10/09/2019   Psoriasis    Past  Surgical History:  Procedure Laterality Date   COLONOSCOPY WITH PROPOFOL N/A 03/09/2020   Procedure: COLONOSCOPY WITH PROPOFOL;  Surgeon: [name], MD;  Location: [facility] ENDOSCOPY;  Service: Gastroenterology;  Laterality: N/A;   CORONARY STENT PLACEMENT Right    ESOPHAGOGASTRODUODENOSCOPY (EGD) WITH PROPOFOL N/A 03/09/2020   Procedure: ESOPHAGOGASTRODUODENOSCOPY (EGD) WITH PROPOFOL;  Surgeon: [name], MD;  Location: [facility] ENDOSCOPY;  Service: Gastroenterology;  Laterality: N/A;  Per Dr. Vicente Males, schedule at 7:30 am   TRANSURETHRAL RESECTION OF PROSTATE  ~2010   Family History  Problem Relation Age of Onset   Hypertension Mother    CAD Brother    Psoriasis Brother     Social History   Tobacco Use   Smoking status: Former    Packs/day: 0.25    Years: 1.00    Pack years: 0.25    Types: Cigarettes    Quit date: 1964    Years since quitting: 58.6   Smokeless tobacco: Never  Substance Use Topics   Alcohol use: Yes    Comment: 2 to 3 times per week   Marital Status: Married   ROS  Review of Systems  Constitutional: Negative for malaise/fatigue and weight gain.  Cardiovascular:  Positive for dyspnea on exertion (mild when walking uphill, stable). Negative for chest pain, claudication, leg swelling, near-syncope, orthopnea, palpitations, paroxysmal nocturnal dyspnea and syncope.  Respiratory:  Negative for shortness of breath.   Neurological:  Negative for dizziness.   Objective  Blood pressure 126/61, pulse 78, temperature 98 F (36.7 C), resp. rate 16, height '5\' 6"'$  (1.676 m),  weight 158 lb (71.7 kg), SpO2 95 %.  Vitals with BMI 07/12/2021 05/02/2021 08/30/2020  Height '5\' 6"'$  '5\' 6"'$  '5\' 6"'$   Weight 158 lbs 163 lbs 13 oz 157 lbs 10 oz  BMI 25.51 [id number] 0000000  Systolic 123XX123 123XX123 123XX123  Diastolic 61 68 74  Pulse 78 74 80     Physical Exam Vitals reviewed.  HENT:     Head: Normocephalic and atraumatic.  Cardiovascular:     Rate and Rhythm: Normal rate and regular rhythm.     Pulses:  Intact distal pulses.          Carotid pulses are 2+ on the right side and 2+ on the left side.      Radial pulses are 2+ on the right side and 2+ on the left side.       Femoral pulses are 2+ on the right side and 2+ on the left side.      Popliteal pulses are 2+ on the right side and 2+ on the left side.       Dorsalis pedis pulses are 2+ on the right side and 2+ on the left side.       Posterior tibial pulses are 2+ on the right side and 2+ on the left side.     Heart sounds: S1 normal and S2 normal. No murmur heard.   No gallop.  Pulmonary:     Effort: Pulmonary effort is normal. No respiratory distress.     Comments: Crackles bilateral bases Musculoskeletal:     Right lower leg: No edema.     Left lower leg: No edema.  Neurological:     Mental Status: He is alert.   Laboratory examination:   CMP Latest Ref Rng & Units 04/07/2021 09/01/2020 07/14/2020  Glucose 65 - 99 mg/dL 132(H) 121(H) -  BUN 8 - 27 mg/dL 11 19 -  Creatinine 0.76 - 1.27 mg/dL 0.95 0.96 -  Sodium 134 - 144 mmol/L 139 134(L) -  Potassium 3.5 - 5.2 mmol/L 4.4 4.3 -  Chloride 96 - 106 mmol/L 100 99 -  CO2 20 - 29 mmol/L 22 26 -  Calcium 8.6 - 10.2 mg/dL 9.1 9.2 -  Total Protein 6.0 - 8.5 g/dL 6.5 7.1 6.8  Total Bilirubin 0.0 - 1.2 mg/dL 0.6 0.7 0.9  Alkaline Phos 44 - 121 IU/L 50 54 65  AST 0 - 40 IU/L 19 38 57(H)  ALT 0 - 44 IU/L 14 47(H) 82(H)   CBC Latest Ref Rng & Units 04/07/2021 02/12/2020 10/09/2019  WBC 3.4 - 10.8 x10E3/uL 12.3(H) 8.5 10.4  Hemoglobin 13.0 - 17.7 g/dL 16.5 15.3 15.8  Hematocrit 37.5 - 51.0 % 48.8 45.3 47.0  Platelets 150 - 450 x10E3/uL 232 250.0 233   Lipid Panel  No results found for: CHOL, TRIG, HDL, CHOLHDL, VLDL, LDLCALC, LDLDIRECT HEMOGLOBIN A1C Lab Results  Component Value Date   HGBA1C 6.0 (A) 10/09/2019   TSH No results for input(s): TSH in the last 8760 hours. BNP  External labs:   02/12/2020: TSH 1.61  Allergies  No Known Allergies   Medications Prior to Visit:    Outpatient Medications Prior to Visit  Medication Sig Dispense Refill   albuterol (PROVENTIL HFA) 108 (90 Base) MCG/ACT inhaler Inhale 1-2 puffs into the lungs every 6 (six) hours as needed for wheezing or shortness of breath. 18 g 3   aspirin EC 81 MG tablet Take 81 mg by mouth daily.     bisacodyl (DULCOLAX) 5 MG  EC tablet Take 5 mg by mouth daily as needed for moderate constipation.     budesonide-formoterol (SYMBICORT) 80-4.5 MCG/ACT inhaler Inhale 2 puffs into the lungs 2 (two) times daily. 30.6 g 3   CICLOPIROX-CLOBETASOL-SAL ACID EX Apply topically.     clobetasol ointment (TEMOVATE) [id number] % Apply 1 application topically 2 (two) times daily as needed. Use for up to 5 days. Avoid applying to face, groin, and axilla. Use as directed. 30 g 1   levothyroxine (SYNTHROID) 50 MCG tablet Take 1 tablet (50 mcg total) by mouth daily. 90 tablet 3   metFORMIN (GLUCOPHAGE) 1000 MG tablet Take 1 tablet (1,000 mg total) by mouth 2 (two) times daily with a meal. 180 tablet 3   metoprolol succinate (TOPROL-XL) 50 MG 24 hr tablet Take 1 tablet (50 mg total) by mouth daily. Take with or immediately following a meal. 90 tablet 3   Nintedanib (OFEV) 100 MG CAPS Take 1 capsule (100 mg total) by mouth 2 (two) times daily. 60 capsule 0   OFEV 150 MG CAPS TAKE 1 CAPSULE TWICE A DAY 60 capsule 5   Risankizumab-rzaa (SKYRIZI) 150 MG/ML SOSY Inject 150 mg into the skin as directed. Every 12 weeks for maintenance. 1 mL 1   rosuvastatin (CRESTOR) 20 MG tablet Take 1 tablet (20 mg total) by mouth daily. 90 tablet 3   telmisartan (MICARDIS) 40 MG tablet Take 1 tablet (40 mg total) by mouth daily. 90 tablet 3   clopidogrel (PLAVIX) 75 MG tablet Take 1 tablet (75 mg total) by mouth daily. 90 tablet 3   Risankizumab-rzaa (SKYRIZI) 150 MG/ML SOSY Inject 150 mg into the skin as directed. At weeks 0 & 4. 1 mL 1   No facility-administered medications prior to visit.   Final Medications at End of Visit    Current Meds   Medication Sig   albuterol (PROVENTIL HFA) 108 (90 Base) MCG/ACT inhaler Inhale 1-2 puffs into the lungs every 6 (six) hours as needed for wheezing or shortness of breath.   aspirin EC 81 MG tablet Take 81 mg by mouth daily.   bisacodyl (DULCOLAX) 5 MG EC tablet Take 5 mg by mouth daily as needed for moderate constipation.   budesonide-formoterol (SYMBICORT) 80-4.5 MCG/ACT inhaler Inhale 2 puffs into the lungs 2 (two) times daily.   CICLOPIROX-CLOBETASOL-SAL ACID EX Apply topically.   clobetasol ointment (TEMOVATE) [id number] % Apply 1 application topically 2 (two) times daily as needed. Use for up to 5 days. Avoid applying to face, groin, and axilla. Use as directed.   levothyroxine (SYNTHROID) 50 MCG tablet Take 1 tablet (50 mcg total) by mouth daily.   metFORMIN (GLUCOPHAGE) 1000 MG tablet Take 1 tablet (1,000 mg total) by mouth 2 (two) times daily with a meal.   metoprolol succinate (TOPROL-XL) 50 MG 24 hr tablet Take 1 tablet (50 mg total) by mouth daily. Take with or immediately following a meal.   Nintedanib (OFEV) 100 MG CAPS Take 1 capsule (100 mg total) by mouth 2 (two) times daily.   OFEV 150 MG CAPS TAKE 1 CAPSULE TWICE A DAY   Risankizumab-rzaa (SKYRIZI) 150 MG/ML SOSY Inject 150 mg into the skin as directed. Every 12 weeks for maintenance.   rosuvastatin (CRESTOR) 20 MG tablet Take 1 tablet (20 mg total) by mouth daily.   telmisartan (MICARDIS) 40 MG tablet Take 1 tablet (40 mg total) by mouth daily.   [DISCONTINUED] clopidogrel (PLAVIX) 75 MG tablet Take 1 tablet (75 mg total) by mouth daily.   [  DISCONTINUED] Risankizumab-rzaa (SKYRIZI) 150 MG/ML SOSY Inject 150 mg into the skin as directed. At weeks 0 & 4.   Radiology:   No results found. CT Chest 03/29/2021:  1. 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 Minimal progression compared to  the prior examination. 2. Aortic atherosclerosis, in addition to left main and 3 vessel coronary artery disease. Please note that although the presence of coronary artery calcium documents the presence of coronary artery disease, the severity of this disease and any potential stenosis cannot be assessed on this non-gated CT examination. Assessment for potential risk factor modification, dietary therapy or pharmacologic therapy may be warranted, if clinically indicated. 3. Cholelithiasis. Aortic Atherosclerosis  Cardiac Studies:   None available for review   EKG:   EKG 07/12/2021: Normal sinus rhythm with rate of 73 bpm, left axis deviation, cannot exclude inferior infarct old.  Poor R wave progression, cannot exclude anteroseptal infarct old.  Nonspecific T abnormality.  Assessment     ICD-10-CM   1. Essential hypertension  I10     2. Coronary artery disease involving native coronary artery of native heart, unspecified whether angina present  I25.10 EKG 12-Lead    PCV MYOCARDIAL PERFUSION WO LEXISCAN    3. Hypercholesterolemia  E78.00 Lipid Panel With LDL/HDL Ratio    4. IPF (idiopathic pulmonary fibrosis) ([place])  J84.112     5. Dyspnea on exertion  R06.00 PCV ECHOCARDIOGRAM COMPLETE       Medications Discontinued During This Encounter  Medication Reason   Risankizumab-rzaa (SKYRIZI) 150 MG/ML SOSY Error   clopidogrel (PLAVIX) 75 MG tablet Discontinued by provider    No orders of the defined types were placed in this encounter.   Recommendations:   Aaryav Schiffler is a 75 y.o. 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 Patient has history of Covid-19 infection 11/2020. Recent chest CT also revealed aortic atherosclerosis in addition  to left main and three-vessel coronary artery calcification. Patient is referred to our office to establish cardiovascular care in the [place].  Patient is presently on guideline directed medical therapy for CAD including telmisartan, rosuvastatin, metoprolol succinate, as well as aspirin and Plavix.  We will discontinue Plavix at this time and continue aspirin 81 mg daily.  Given patient's history of CAD as well as symptoms of dyspnea on exertion will obtain exercise nuclear stress test, however can convert to [facility] if necessary.  We will also obtain echocardiogram, particularly to evaluate for IPF related pulmonary hypertension.  We will obtain lipid profile testing to further risk stratify patient as well as manage cardiovascular risk factors.  With the exception of mild dyspnea on exertion, which is likely multifactorial including IPF, patient is relatively asymptomatic.  Blood pressure is well controlled.   Further recommendations and follow-up pending results of cardiovascular testing.  Patient was seen in collaboration with Dr. Einar Gip. He also reviewed patient's chart and examined the patient. Dr. Einar Gip is in agreement of the plan.    Alethia Berthold, PA-C 07/12/2021, 4:36 PM Office: [phone]

## 2021-07-12 ENCOUNTER — Encounter: Payer: Self-pay | Admitting: Cardiology

## 2021-07-12 ENCOUNTER — Ambulatory Visit: Payer: Medicare HMO | Admitting: Cardiology

## 2021-07-12 ENCOUNTER — Other Ambulatory Visit: Payer: Self-pay

## 2021-07-12 VITALS — BP 126/61 | HR 78 | Temp 98.0°F | Resp 16 | Ht 66.0 in | Wt 158.0 lb

## 2021-07-12 DIAGNOSIS — R0609 Other forms of dyspnea: Secondary | ICD-10-CM

## 2021-07-12 DIAGNOSIS — J84112 Idiopathic pulmonary fibrosis: Secondary | ICD-10-CM

## 2021-07-12 DIAGNOSIS — I10 Essential (primary) hypertension: Secondary | ICD-10-CM | POA: Diagnosis not present

## 2021-07-12 DIAGNOSIS — I2510 Atherosclerotic heart disease of native coronary artery without angina pectoris: Secondary | ICD-10-CM

## 2021-07-12 DIAGNOSIS — E7800 Pure hypercholesterolemia, unspecified: Secondary | ICD-10-CM

## 2021-07-12 DIAGNOSIS — R0600 Dyspnea, unspecified: Secondary | ICD-10-CM

## 2021-07-20 ENCOUNTER — Other Ambulatory Visit: Payer: Self-pay | Admitting: Internal Medicine

## 2021-07-20 MED ORDER — ALBUTEROL SULFATE HFA 108 (90 BASE) MCG/ACT IN AERS: 1.0000 | INHALATION_SPRAY | Freq: Four times a day (QID) | RESPIRATORY_TRACT | 3 refills | Status: DC | PRN

## 2021-07-22 ENCOUNTER — Other Ambulatory Visit: Payer: Self-pay

## 2021-07-22 DIAGNOSIS — I2510 Atherosclerotic heart disease of native coronary artery without angina pectoris: Secondary | ICD-10-CM

## 2021-07-22 DIAGNOSIS — I10 Essential (primary) hypertension: Secondary | ICD-10-CM

## 2021-07-25 ENCOUNTER — Other Ambulatory Visit: Payer: Self-pay

## 2021-07-25 ENCOUNTER — Ambulatory Visit: Payer: Medicare HMO

## 2021-07-25 ENCOUNTER — Other Ambulatory Visit
Admission: RE | Admit: 2021-07-25 | Discharge: 2021-07-25 | Disposition: A | Discharge status: Home / Self Care | Payer: Medicare HMO | Source: Ambulatory Visit | Attending: Pulmonary Disease | Admitting: Pulmonary Disease

## 2021-07-25 DIAGNOSIS — I10 Essential (primary) hypertension: Secondary | ICD-10-CM | POA: Diagnosis not present

## 2021-07-25 DIAGNOSIS — Z20822 Contact with and (suspected) exposure to covid-19: Secondary | ICD-10-CM | POA: Diagnosis not present

## 2021-07-25 DIAGNOSIS — Z01812 Encounter for preprocedural laboratory examination: Secondary | ICD-10-CM | POA: Insufficient documentation

## 2021-07-25 DIAGNOSIS — I2510 Atherosclerotic heart disease of native coronary artery without angina pectoris: Secondary | ICD-10-CM | POA: Diagnosis not present

## 2021-07-25 LAB — SARS CORONAVIRUS 2 (TAT 6-24 HRS): SARS Coronavirus 2: NEGATIVE

## 2021-07-26 ENCOUNTER — Ambulatory Visit: Payer: Medicare HMO | Attending: Pulmonary Disease

## 2021-07-26 DIAGNOSIS — Z7951 Long term (current) use of inhaled steroids: Secondary | ICD-10-CM | POA: Insufficient documentation

## 2021-07-26 DIAGNOSIS — J84112 Idiopathic pulmonary fibrosis: Secondary | ICD-10-CM | POA: Insufficient documentation

## 2021-07-26 MED ORDER — ALBUTEROL SULFATE (2.5 MG/3ML) 0.083% IN NEBU
2.5000 mg | INHALATION_SOLUTION | Freq: Once | RESPIRATORY_TRACT | Status: AC
  Administered 2021-07-26: 2.5 mg via RESPIRATORY_TRACT
  Filled 2021-07-26: qty 3

## 2021-07-27 DIAGNOSIS — E7800 Pure hypercholesterolemia, unspecified: Secondary | ICD-10-CM | POA: Diagnosis not present

## 2021-07-27 NOTE — Progress Notes (Signed)
Dr. Einar Gip spoke directly with patient's son regarding results.

## 2021-07-28 ENCOUNTER — Other Ambulatory Visit: Payer: Self-pay | Admitting: Pulmonary Disease

## 2021-07-28 DIAGNOSIS — J84112 Idiopathic pulmonary fibrosis: Secondary | ICD-10-CM

## 2021-07-28 LAB — LIPID PANEL WITH LDL/HDL RATIO
Cholesterol, Total: 108 mg/dL (ref 100–199)
HDL: 45 mg/dL (ref >39)
LDL Chol Calc (NIH): 46 mg/dL (ref 0–99)
LDL/HDL Ratio: 1 ratio (ref 0.0–3.6)
Triglycerides: 83 mg/dL (ref 0–149)
VLDL Cholesterol Cal: 17 mg/dL (ref 5–40)

## 2021-08-23 ENCOUNTER — Other Ambulatory Visit: Payer: Self-pay

## 2021-08-23 ENCOUNTER — Ambulatory Visit: Payer: Medicare HMO | Admitting: Pulmonary Disease

## 2021-08-23 ENCOUNTER — Encounter: Payer: Self-pay | Admitting: Pulmonary Disease

## 2021-08-23 VITALS — BP 108/64 | HR 88 | Temp 98.8°F | Ht 66.0 in | Wt 156.2 lb

## 2021-08-23 DIAGNOSIS — J84112 Idiopathic pulmonary fibrosis: Secondary | ICD-10-CM

## 2021-08-23 DIAGNOSIS — Z5181 Encounter for therapeutic drug level monitoring: Secondary | ICD-10-CM

## 2021-08-23 NOTE — Patient Instructions (Signed)
I have reviewed your lung function test which do show decline in lung capacity compared to 2021  We will continue the current medications for now since you are doing well on it Will refer you to pulmonary rehab in [place] which would make a difference in your exercise capacity I will also make a referral to transplant program at [facility] for evaluation for IPF  Make sure you get your labs including hepatic panel and BNP checked at primary care visit later this month  Return to clinic in 3 to 4 months

## 2021-08-23 NOTE — Progress Notes (Signed)
Nicholas Fleming    ZQ:[id number]    1946-06-07  Primary Care Physician:Hoffman, Rachel Moulds, DO  Referring Physician: [name], DO [address]  [place],  [place] [address]  Chief complaint: Follow-up for IPF  HPI: 75 year old with history of angina, hypertension, diabetes, hyperlipidemia, psoriasis.  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  Cough is nonproductive in nature associated with mild dyspnea on exertion.  He has history of angina, coronary artery disease with a stent placement many years ago.  He has longstanding psoriasis presenting as rash.  He was on methotrexate for 2 years around 2013 and self discontinued due to concern for side effects.  Recently has been started on [place] with improvement in rash.  He developed COVID-19 infection at the end of December and treated with monoclonal antibody on 12/29 with improvement in symptoms. Started on Ofev at the end of January 2021.  He is tolerating it well with no GI issues.  Evaluated by GI for an unexplained 10 to 20 pound weight loss.  Underwent EGD and colonoscopy in 03/09/20 with findings of benign colon polyps.    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  No mold, hot tub, Jacuzzi, no down pillows or comforter ILD questionnaire 01/12/2020-negative Smoking history: Used to smoke socially.  Quit many years ago Travel history: Originally from [place].  He had lived in [place] for over 30 years and now spends his time between [place] and [place] Relevant family history: No significant family history of lung disease  Interval history: States that he is tolerating Ofev well without any issues CT shows minimal progression, however PFTs show  worsening restriction and diffusion impairment and is here to discuss results  Recently evaluated by Dr. Einar Gip with echocardiogram with no evidence of pulmonary hypertension  Outpatient Encounter Medications as of 08/23/2021  Medication Sig   albuterol (PROVENTIL HFA) 108 (90 Base) MCG/ACT inhaler Inhale 1-2 puffs into the lungs every 6 (six) hours as needed for wheezing or shortness of breath.   aspirin EC 81 MG tablet Take 81 mg by mouth daily.   bisacodyl (DULCOLAX) 5 MG EC tablet Take 5 mg by mouth daily as needed for moderate constipation.   budesonide-formoterol (SYMBICORT) 80-4.5 MCG/ACT inhaler Inhale 2 puffs into the lungs 2 (two) times daily.   CICLOPIROX-CLOBETASOL-SAL ACID EX Apply topically.   clobetasol ointment (TEMOVATE) [id number] % Apply 1 application topically 2 (two) times daily as needed. Use for up to 5 days. Avoid applying to face, groin, and axilla. Use as directed.   levothyroxine (SYNTHROID) 50 MCG tablet Take 1 tablet (50 mcg total) by mouth daily.   metFORMIN (GLUCOPHAGE) 1000 MG tablet Take 1 tablet (1,000 mg total) by mouth 2 (two) times daily with a meal.   metoprolol succinate (TOPROL-XL) 50 MG 24 hr tablet Take 1 tablet (50 mg total) by mouth daily. Take with or immediately following a meal.   Nintedanib (OFEV) 100 MG CAPS Take 1 capsule (100 mg total) by mouth 2 (two) times daily.   OFEV 150 MG CAPS TAKE 1 CAPSULE TWICE A DAY   Risankizumab-rzaa (SKYRIZI) 150 MG/ML SOSY Inject 150 mg into the skin as directed. Every 12  weeks for maintenance.   rosuvastatin (CRESTOR) 20 MG tablet Take 1 tablet (20 mg total) by mouth daily.   telmisartan (MICARDIS) 40 MG tablet Take 1 tablet (40 mg total) by mouth daily.   No facility-administered encounter medications on file as of 08/23/2021.   Physical Exam: Blood pressure 122/68, pulse 74, temperature 97.6 F (36.4 C), temperature source Temporal, height '5\' 6"'$  (1.676 m), weight 163 lb 12.8 oz (74.3 kg), SpO2 99 %. Gen:      No  acute distress HEENT:  EOMI, sclera anicteric Neck:     No masses; no thyromegaly Lungs:    Bibasal crackles CV:         Regular rate and rhythm; no murmurs Abd:      + bowel sounds; soft, non-tender; no palpable masses, no distension Ext:    No edema; adequate peripheral perfusion Skin:      Warm and dry; no rash Neuro: alert and oriented x 3 Psych: normal mood and affect  Data Reviewed: Imaging: High-res CT 11/19/2019-peripheral and basal subpleural reticulation with groundglass, traction bronchiectasis.  Probable honeycombing.  5 mm lung nodule.  Probable UIP pattern  CT abdomen 03/17/2020-stable pulmonary fibrosis at the base.  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  07/26/2021 FVC 1.48 [41%], FEV1 1.31 [50%], F/F 88, TLC 2.78 [44%], DLCO 10.37 [46%] Severe restriction, severe diffusion defect.  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  No known exposures or signs and symptoms of connective tissue disease Serologies for CTD are negative Reviewed at multidisciplinary conference on 12/30/19.  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  Continues on OFev Ofev.  He is tolerating it well He will get a follow-up hepatic panel and BNP at his upcoming primary care visit  The CT shows minimal progression  and clinically his exercise capacity is unimpaired though PFTs do show reduction in lung volumes and diffusion capacity  Reviewed these in detail with him and his son today. We discussed options of switching antifibrotics but decided to continue with Ofev for now since he is tolerating it well and there is no guarantee that we will do any better on [place] He is not interested in clinical trials I will make a referral to pulmonary rehab and referral for transplant at [facility]  Plan/Recommendations: - Continue Ofev - Hepatic panel, BNP at primary care visit - Pulmonary rehab, transplant evaluation to Shirlee Limerick MD [facility] 08/23/2021, 12:03 PM  CC: [name], DO

## 2021-08-24 ENCOUNTER — Encounter: Payer: Self-pay | Admitting: Pulmonary Disease

## 2021-08-25 ENCOUNTER — Encounter: Payer: Self-pay | Admitting: Internal Medicine

## 2021-08-25 ENCOUNTER — Ambulatory Visit (INDEPENDENT_AMBULATORY_CARE_PROVIDER_SITE_OTHER): Payer: Medicare HMO | Admitting: Internal Medicine

## 2021-08-25 ENCOUNTER — Other Ambulatory Visit: Payer: Self-pay

## 2021-08-25 VITALS — BP 125/63 | HR 96 | Temp 97.8°F | Ht 66.0 in | Wt 158.9 lb

## 2021-08-25 DIAGNOSIS — J84112 Idiopathic pulmonary fibrosis: Secondary | ICD-10-CM

## 2021-08-25 DIAGNOSIS — I2510 Atherosclerotic heart disease of native coronary artery without angina pectoris: Secondary | ICD-10-CM

## 2021-08-25 DIAGNOSIS — E039 Hypothyroidism, unspecified: Secondary | ICD-10-CM | POA: Diagnosis not present

## 2021-08-25 DIAGNOSIS — L308 Other specified dermatitis: Secondary | ICD-10-CM

## 2021-08-25 DIAGNOSIS — Z9861 Coronary angioplasty status: Secondary | ICD-10-CM

## 2021-08-25 DIAGNOSIS — I10 Essential (primary) hypertension: Secondary | ICD-10-CM

## 2021-08-25 DIAGNOSIS — E119 Type 2 diabetes mellitus without complications: Secondary | ICD-10-CM

## 2021-08-25 LAB — POCT GLYCOSYLATED HEMOGLOBIN (HGB A1C): Hemoglobin A1C: 6.1 % — AB (ref 4.0–5.6)

## 2021-08-25 LAB — BRAIN NATRIURETIC PEPTIDE: B Natriuretic Peptide: 56.8 pg/mL (ref 0.0–100.0)

## 2021-08-25 LAB — GLUCOSE, CAPILLARY: Glucose-Capillary: 160 mg/dL — ABNORMAL HIGH (ref 70–99)

## 2021-08-25 MED ORDER — METFORMIN HCL 1000 MG PO TABS: 1000.0000 mg | ORAL_TABLET | Freq: Every day | ORAL | 3 refills | Status: DC

## 2021-08-25 MED ORDER — TELMISARTAN 20 MG PO TABS: 20.0000 mg | ORAL_TABLET | Freq: Every day | ORAL | 3 refills | Status: DC

## 2021-08-25 NOTE — Assessment & Plan Note (Signed)
HPI: He has had some mild unintentional weight loss.  He has self decreased metformin to 1 g daily with no subsequent elevations in his glycemic control.  He has no complaints otherwise.  Assessment well-controlled type 2 diabetes mellitus  Plan Metformin decreased to 1 g daily.

## 2021-08-25 NOTE — Assessment & Plan Note (Addendum)
He has been following with Dr. Vaughan Browner, remains on Ofev.  Recent PFTs were worse high-res CT unchanged.  Has plans for pulmonary exercise tolerance testing.  Will check CMP and BNP today.

## 2021-08-25 NOTE — Assessment & Plan Note (Signed)
Recently saw Dr. Einar Gip.  His Plavix has been discontinued and he remains on aspirin alone.  He had a low risk stress test and normal echocardiogram.

## 2021-08-25 NOTE — Progress Notes (Signed)
  Subjective:  HPI: [name] is a 75 y.o. male who presents for HTN and DM follow up he is accompanied by his son.  Please see Assessment and Plan below for the status of his chronic medical problems.  Objective:  Physical Exam: Vitals:   08/25/21 1118  BP: 125/63  Pulse: 96  Temp: 97.8 F (36.6 C)  TempSrc: Oral  SpO2: 95%  Weight: 158 lb 14.4 oz (72.1 kg)  Height: '5\' 6"'$  (1.676 m)   Body mass index is 25.65 kg/m. Physical Exam Constitutional:      Appearance: Normal appearance.  Cardiovascular:     Rate and Rhythm: Normal rate and regular rhythm.     Pulses:          Dorsalis pedis pulses are 2+ on the right side and 2+ on the left side.       Posterior tibial pulses are 2+ on the right side and 2+ on the left side.     Heart sounds: Normal heart sounds.  Pulmonary:     Effort: Pulmonary effort is normal.     Comments: Fine crackles at bilateral bases Neurological:     Mental Status: He is alert.   Assessment & Plan:  See Encounters Tab for problem based charting.  Medications Ordered Meds ordered this encounter  Medications   metFORMIN (GLUCOPHAGE) 1000 MG tablet    Sig: Take 1 tablet (1,000 mg total) by mouth daily with breakfast.    Dispense:  90 tablet    Refill:  3    Place on file   telmisartan (MICARDIS) 20 MG tablet    Sig: Take 1 tablet (20 mg total) by mouth daily.    Dispense:  90 tablet    Refill:  3    Dose change.   Other Orders Orders Placed This Encounter  Procedures   Glucose, capillary   CMP14 + Anion Gap   Brain natriuretic peptide   TSH   POC Hbg A1C   Follow Up: Return in about 6 months (around 02/22/2022).

## 2021-08-25 NOTE — Patient Instructions (Signed)
I have decreased you dose of your blood pressure pill (telmisartan) to '20mg'$  a day.  Your A1c is doing great with Metformin only once a day, keep up the good work!

## 2021-08-25 NOTE — Assessment & Plan Note (Signed)
Following with Dr. Adolph Pollack and well controlled on [place].

## 2021-08-25 NOTE — Assessment & Plan Note (Signed)
Due for repeat TSH, he remains on 79mg of levothyroixine daily

## 2021-08-25 NOTE — Assessment & Plan Note (Signed)
Overall no complaints.  Son reports however that at a recent outing he had a spell of dizziness that was suspected due to some orthostatic hypotension.  Felt better with sitting down and drinking water.  He has been taking 40 mg of telmisartan daily.  Initial blood pressure here excellent at 125/63 recent blood pressure at pulmonology a bit lower but still within normal range.  Orthostatics here were low normal baseline and negative.  Assessment essential hypertension well-controlled possible orthostasis symptoms  Plan Decrease telmisartan to 20 mg daily.

## 2021-08-26 ENCOUNTER — Ambulatory Visit: Payer: Medicare HMO

## 2021-08-26 LAB — CMP14 + ANION GAP
ALT: 14 IU/L (ref 0–44)
AST: 21 IU/L (ref 0–40)
Albumin/Globulin Ratio: 1.6 (ref 1.2–2.2)
Albumin: 3.7 g/dL (ref 3.7–4.7)
Alkaline Phosphatase: 46 IU/L (ref 44–121)
Anion Gap: 13 mmol/L (ref 10.0–18.0)
BUN/Creatinine Ratio: 14 (ref 10–24)
BUN: 11 mg/dL (ref 8–27)
Bilirubin Total: 0.4 mg/dL (ref 0.0–1.2)
CO2: 21 mmol/L (ref 20–29)
Calcium: 8.9 mg/dL (ref 8.6–10.2)
Chloride: 102 mmol/L (ref 96–106)
Creatinine, Ser: 0.77 mg/dL (ref 0.76–1.27)
Globulin, Total: 2.3 g/dL (ref 1.5–4.5)
Glucose: 117 mg/dL — ABNORMAL HIGH (ref 65–99)
Potassium: 4.2 mmol/L (ref 3.5–5.2)
Sodium: 136 mmol/L (ref 134–144)
Total Protein: 6 g/dL (ref 6.0–8.5)
eGFR: 93 mL/min/{1.73_m2} (ref >59)

## 2021-08-26 LAB — TSH: TSH: 3.74 u[IU]/mL (ref 0.450–4.500)

## 2021-09-06 ENCOUNTER — Encounter: Payer: Self-pay | Admitting: Cardiology

## 2021-09-06 ENCOUNTER — Other Ambulatory Visit: Payer: Self-pay

## 2021-09-06 ENCOUNTER — Ambulatory Visit: Payer: Medicare HMO | Admitting: Cardiology

## 2021-09-06 VITALS — BP 119/74 | HR 75 | Temp 97.4°F | Resp 17 | Ht 66.0 in | Wt 158.0 lb

## 2021-09-06 DIAGNOSIS — I25118 Atherosclerotic heart disease of native coronary artery with other forms of angina pectoris: Secondary | ICD-10-CM

## 2021-09-06 DIAGNOSIS — I10 Essential (primary) hypertension: Secondary | ICD-10-CM | POA: Diagnosis not present

## 2021-09-06 DIAGNOSIS — J84112 Idiopathic pulmonary fibrosis: Secondary | ICD-10-CM

## 2021-09-06 DIAGNOSIS — E7800 Pure hypercholesterolemia, unspecified: Secondary | ICD-10-CM | POA: Diagnosis not present

## 2021-09-06 NOTE — Progress Notes (Signed)
Primary Physician/Referring:  [name], DO  Patient ID: Nicholas Fleming, male    DOB: Apr 28, 1946, 75 y.o.   MRN: [id number]  Chief Complaint  Patient presents with   Results   HPI:    Nicholas Fleming  is a 75 y.o. 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 Patient has history of Covid-19 infection 11/2020. Recent chest CT also revealed aortic atherosclerosis in addition to left main and three-vessel coronary artery calcification. Patient states that in 2015 he was having dyspnea on exertion and underwent stress testing in [place] which was abnormal, which subsequently led to PCI in 2015.     Patient is presently on guideline directed medical therapy for CAD including telmisartan, rosuvastatin, metoprolol succinate, as well as aspirin. Overall patient is presently feeling well, however he does report dyspnea on exertion particularly when walking uphill and has not been exercising much over the last 1 year although he previously walked on a daily basis.  He follows closely with pulmonology for management of IPF. Patient denies chest pain, palpitations, orthopnea, PND, syncope, near syncope, dizziness.  Past Medical History:  Diagnosis Date   Allergy    Asthma    Bronchitis    Controlled type 2 diabetes mellitus ([place]) 10/09/2019   Essential hypertension 10/09/2019   Hypothyroidism 10/09/2019   Psoriasis    Past Surgical History:  Procedure Laterality Date   COLONOSCOPY WITH PROPOFOL N/A 03/09/2020   Procedure: COLONOSCOPY WITH PROPOFOL;  Surgeon: [name], MD;  Location: [facility] ENDOSCOPY;  Service: Gastroenterology;  Laterality: N/A;   CORONARY STENT PLACEMENT Right    ESOPHAGOGASTRODUODENOSCOPY (EGD) WITH PROPOFOL N/A 03/09/2020    Procedure: ESOPHAGOGASTRODUODENOSCOPY (EGD) WITH PROPOFOL;  Surgeon: [name], MD;  Location: [facility] ENDOSCOPY;  Service: Gastroenterology;  Laterality: N/A;  Per Dr. Vicente Males, schedule at 7:30 am   TRANSURETHRAL RESECTION OF PROSTATE  ~2010   Family History  Problem Relation Age of Onset   Hypertension Mother    CAD Brother    Psoriasis Brother     Social History   Tobacco Use   Smoking status: Former    Packs/day: 0.25    Years: 1.00    Pack years: 0.25    Types: Cigarettes    Quit date: 1964    Years since quitting: 58.7   Smokeless tobacco: Never  Substance Use Topics   Alcohol use: Yes    Comment: 1 to2 times per week   Marital Status: Married   ROS  Review of Systems  Constitutional: Negative for malaise/fatigue and weight gain.  Cardiovascular:  Positive for dyspnea on exertion (mild when walking uphill, stable). Negative for chest pain, claudication, leg swelling, near-syncope, orthopnea, palpitations, paroxysmal nocturnal dyspnea and syncope.  Respiratory:  Negative for shortness of breath.   Neurological:  Negative for dizziness.   Objective  Blood pressure 119/74, pulse 75, temperature (!) 97.4 F (36.3 C), temperature source Temporal, resp. rate 17, height 5\' 6"  (1.676 m), weight 158 lb (71.7 kg), SpO2 97 %.  Vitals with BMI 09/06/2021 08/25/2021 08/23/2021  Height 5\' 6"  5\' 6"  5\' 6"   Weight 158 lbs 158 lbs 14 oz 156 lbs 3 oz  BMI 25.51 63.84 66.59  Systolic 935 701 779  Diastolic 74 63 64  Pulse 75 96 88     Physical  Exam Vitals reviewed.  HENT:     Head: Normocephalic and atraumatic.  Cardiovascular:     Rate and Rhythm: Normal rate and regular rhythm.     Pulses: Intact distal pulses.          Carotid pulses are 2+ on the right side and 2+ on the left side.      Radial pulses are 2+ on the right side and 2+ on the left side.       Femoral pulses are 2+ on the right side and 2+ on the left side.      Popliteal pulses are 2+ on the right side and 2+ on the  left side.       Dorsalis pedis pulses are 2+ on the right side and 2+ on the left side.       Posterior tibial pulses are 2+ on the right side and 2+ on the left side.     Heart sounds: S1 normal and S2 normal. No murmur heard.   No gallop.  Pulmonary:     Effort: Pulmonary effort is normal. No respiratory distress.     Comments: Crackles bilateral bases Musculoskeletal:     Right lower leg: No edema.     Left lower leg: No edema.  Neurological:     Mental Status: He is alert.   Laboratory examination:   CMP Latest Ref Rng & Units 08/25/2021 04/07/2021 09/01/2020  Glucose 65 - 99 mg/dL 117(H) 132(H) 121(H)  BUN 8 - 27 mg/dL 11 11 19   Creatinine 0.76 - 1.27 mg/dL 0.77 0.95 0.96  Sodium 134 - 144 mmol/L 136 139 134(L)  Potassium 3.5 - 5.2 mmol/L 4.2 4.4 4.3  Chloride 96 - 106 mmol/L 102 100 99  CO2 20 - 29 mmol/L 21 22 26   Calcium 8.6 - 10.2 mg/dL 8.9 9.1 9.2  Total Protein 6.0 - 8.5 g/dL 6.0 6.5 7.1  Total Bilirubin 0.0 - 1.2 mg/dL 0.4 0.6 0.7  Alkaline Phos 44 - 121 IU/L 46 50 54  AST 0 - 40 IU/L 21 19 38  ALT 0 - 44 IU/L 14 14 47(H)   CBC Latest Ref Rng & Units 04/07/2021 02/12/2020 10/09/2019  WBC 3.4 - 10.8 x10E3/uL 12.3(H) 8.5 10.4  Hemoglobin 13.0 - 17.7 g/dL 16.5 15.3 15.8  Hematocrit 37.5 - 51.0 % 48.8 45.3 47.0  Platelets 150 - 450 x10E3/uL 232 250.0 233   Lipid Panel     Component Value Date/Time   CHOL 108 07/27/2021 1539   TRIG 83 07/27/2021 1539   HDL 45 07/27/2021 1539   LDLCALC 46 07/27/2021 1539   HEMOGLOBIN A1C Lab Results  Component Value Date   HGBA1C 6.1 (A) 08/25/2021   TSH Recent Labs    08/25/21 1204  TSH 3.740   BNP    Component Value Date/Time   BNP 56.8 08/25/2021 1204   External labs:   02/12/2020: TSH 1.61  Allergies  No Known Allergies   Medications Prior to Visit:   Outpatient Medications Prior to Visit  Medication Sig Dispense Refill   albuterol (PROVENTIL HFA) 108 (90 Base) MCG/ACT inhaler Inhale 1-2 puffs into the  lungs every 6 (six) hours as needed for wheezing or shortness of breath. 18 g 3   aspirin EC 81 MG tablet Take 81 mg by mouth daily.     budesonide-formoterol (SYMBICORT) 80-4.5 MCG/ACT inhaler Inhale 2 puffs into the lungs 2 (two) times daily. 30.6 g 3   levothyroxine (SYNTHROID) 50 MCG tablet Take 1 tablet (  50 mcg total) by mouth daily. 90 tablet 3   metFORMIN (GLUCOPHAGE) 1000 MG tablet Take 1 tablet (1,000 mg total) by mouth daily with breakfast. 90 tablet 3   metoprolol succinate (TOPROL-XL) 50 MG 24 hr tablet Take 1 tablet (50 mg total) by mouth daily. Take with or immediately following a meal. 90 tablet 3   OFEV 150 MG CAPS TAKE 1 CAPSULE TWICE A DAY 60 capsule 5   Risankizumab-rzaa (SKYRIZI) 150 MG/ML SOSY Inject 150 mg into the skin as directed. Every 12 weeks for maintenance. 1 mL 1   rosuvastatin (CRESTOR) 20 MG tablet Take 1 tablet (20 mg total) by mouth daily. 90 tablet 3   telmisartan (MICARDIS) 20 MG tablet Take 1 tablet (20 mg total) by mouth daily. 90 tablet 3   bisacodyl (DULCOLAX) 5 MG EC tablet Take 5 mg by mouth daily as needed for moderate constipation.     CICLOPIROX-CLOBETASOL-SAL ACID EX Apply topically.     clobetasol ointment (TEMOVATE) 6.25 % Apply 1 application topically 2 (two) times daily as needed. Use for up to 5 days. Avoid applying to face, groin, and axilla. Use as directed. 30 g 1   Nintedanib (OFEV) 100 MG CAPS Take 1 capsule (100 mg total) by mouth 2 (two) times daily. 60 capsule 0   No facility-administered medications prior to visit.   Final Medications at End of Visit    Current Meds  Medication Sig   albuterol (PROVENTIL HFA) 108 (90 Base) MCG/ACT inhaler Inhale 1-2 puffs into the lungs every 6 (six) hours as needed for wheezing or shortness of breath.   aspirin EC 81 MG tablet Take 81 mg by mouth daily.   budesonide-formoterol (SYMBICORT) 80-4.5 MCG/ACT inhaler Inhale 2 puffs into the lungs 2 (two) times daily.   levothyroxine (SYNTHROID) 50 MCG  tablet Take 1 tablet (50 mcg total) by mouth daily.   metFORMIN (GLUCOPHAGE) 1000 MG tablet Take 1 tablet (1,000 mg total) by mouth daily with breakfast.   metoprolol succinate (TOPROL-XL) 50 MG 24 hr tablet Take 1 tablet (50 mg total) by mouth daily. Take with or immediately following a meal.   OFEV 150 MG CAPS TAKE 1 CAPSULE TWICE A DAY   Risankizumab-rzaa (SKYRIZI) 150 MG/ML SOSY Inject 150 mg into the skin as directed. Every 12 weeks for maintenance.   rosuvastatin (CRESTOR) 20 MG tablet Take 1 tablet (20 mg total) by mouth daily.   telmisartan (MICARDIS) 20 MG tablet Take 1 tablet (20 mg total) by mouth daily.   Radiology:   No results found. CT Chest 03/29/2021:  1. 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 Minimal progression compared to the prior examination. 2. Aortic atherosclerosis, in addition to left main and 3 vessel coronary artery disease. Please note that although the presence of coronary artery calcium documents the presence of coronary artery disease, the severity of this disease and any potential stenosis cannot be assessed on this non-gated CT examination. Assessment for potential risk factor modification, dietary therapy or pharmacologic therapy may be warranted, if clinically indicated. 3. Cholelithiasis. Aortic Atherosclerosis  Cardiac Studies:   Exercise nuclear stress test 07/25/2021: Normal ECG stress. The patient exercised for 2 minutes and 21 seconds of a Bruce protocol, achieving approximately 4.64 METs. Terhe were occasional PVC and V-couplets during peak exercise. No chest pain. Symptoms included dyspnea. Normal BP response.  Myocardial perfusion is abnormal. There  is a very small, mild reversible mild defect in the inferior region. TID is abnormal at 1.21.   Overall LV systolic function is abnormal without  regional wall motion abnormalities. Stress LV EF: 46%.  No previous exam available for comparison. Intermediate risk study.  Echocardiogram 07/19/2021:  1. Normal LV systolic function with EF 72%. Left ventricle cavity is normal in size. Normal global wall motion. Doppler evidence of grade I (impaired) diastolic dysfunction, normal LAP. Calculated EF 72%.  2. Left atrial cavity is mildly dilated.  3. The aortic root is normal in size. Mild atherosclerotic changes in the aorta.  4. No significant valvular abnormality.  5. PA pressure not estimated due to absence of tricuspid regurgitation.  EKG:   EKG 07/12/2021: Normal sinus rhythm with rate of 73 bpm, left axis deviation, cannot exclude inferior infarct old.  Poor R wave progression, cannot exclude anteroseptal infarct old.  Nonspecific T abnormality.  Assessment     ICD-10-CM   1. Coronary artery disease involving native coronary artery of native heart with other form of angina pectoris ([place])  I25.118     2. Essential hypertension  I10     3. Hypercholesterolemia  E78.00     4. IPF (idiopathic pulmonary fibrosis) ([facility])  J84.112        Medications Discontinued During This Encounter  Medication Reason   bisacodyl (DULCOLAX) 5 MG EC tablet Error   CICLOPIROX-CLOBETASOL-SAL ACID EX Error   clobetasol ointment (TEMOVATE) 0.05 % Error   Nintedanib (OFEV) 100 MG CAPS Error    No orders of the defined types were placed in this encounter.  Recommendations:   Chaz Mcglasson is a 75 y.o. 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 Patient has history of Covid-19 infection 11/2020. Recent chest CT also revealed aortic atherosclerosis in addition to left main and three-vessel coronary artery  calcification.    Patient is presently on guideline directed medical therapy for CAD including telmisartan, rosuvastatin, metoprolol succinate, as well as aspirin.  I reviewed the results of the stress test and echocardiogram with the patient.  Patient's stress test is intermediate risk in view of elevated transient ischemic dilatation ratio of 1.22.  LVEF is also mildly depressed.  His exercise capacity is markedly depressed.  Fortunately the echocardiogram reveals preserved LVEF and no pulmonary hypertension.  However I am not convinced that he needs cardiac catheterization at this point, would recommend continued medical therapy unless he develops new symptoms of worsening dyspnea that is beyond his baseline, evidence of any heart failure, recurrence of anginal chest pain.  His dyspnea is multifactorial, I have reviewed his recently performed CT scan that reveals mild progression of IPF and also his PFTs has slightly worsened compared to previous.  I would suggest and agree with pulmonary medicine regarding pulmonary rehab, I discussed with the patient and his daughter-in-law regarding daily exercise by way of walking and evaluating his symptoms if dyspnea is getting worse to proceed with cardiac catheterization.  Otherwise continue present medical therapy.  Will discuss with pulmonary medicine to see whether incentive spirometry would also be appropriate to improve his restrictive airway disease.  Will forward a copy to Dr. Vaughan Browner.  Unless he feels strongly that I should proceed with cardiac catheterization, medical therapy for now, I will see the patient  back in 6 months.     Adrian Prows, MD, [facility] 09/07/2021, 8:03 AM Office: [phone] Fax: [phone] Pager: [phone]

## 2021-09-21 ENCOUNTER — Telehealth: Payer: Self-pay

## 2021-09-21 ENCOUNTER — Encounter: Payer: Self-pay | Admitting: Dermatology

## 2021-09-21 ENCOUNTER — Other Ambulatory Visit: Payer: Self-pay

## 2021-09-21 ENCOUNTER — Ambulatory Visit: Payer: Medicare HMO | Admitting: Dermatology

## 2021-09-21 DIAGNOSIS — L409 Psoriasis, unspecified: Secondary | ICD-10-CM | POA: Diagnosis not present

## 2021-09-21 MED ORDER — RISANKIZUMAB-RZAA 150 MG/ML ~~LOC~~ SOSY
150.0000 mg | PREFILLED_SYRINGE | Freq: Once | SUBCUTANEOUS | Status: AC
  Administered 2021-09-21: 150 mg via SUBCUTANEOUS

## 2021-09-21 MED ORDER — ENSTILAR 0.005-0.064 % EX FOAM: 1.0000 "application " | Freq: Every day | CUTANEOUS | 6 refills | Status: DC

## 2021-09-21 NOTE — Progress Notes (Signed)
   Follow-Up Visit   Subjective  Nicholas Fleming is a 75 y.o. male who presents for the following: Psoriasis (3 month follow up - not completely gone but doing better - Skyrizi injections).  Accompanied by daughter in law who contributes to history and helps translate / explain.  The following portions of the chart were reviewed this encounter and updated as appropriate:   Tobacco  Allergies  Meds  Problems  Med Hx  Surg Hx  Fam Hx     Review of Systems:  No other skin or systemic complaints except as noted in HPI or Assessment and Plan.  Objective  Well appearing patient in no apparent distress; mood and affect are within normal limits.  A focused examination was performed including arms, legs, abdomen. Relevant physical exam findings are noted in the Assessment and Plan.  Residual guttate plaques of knees, hands, elbows with some macular hyperpigmentation.   Assessment & Plan  Psoriasis -Severe but much improved on current regimen of Skyrizi systemic biologic injections.  Not to goal.  Psoriasis is a chronic non-curable, but treatable genetic/hereditary disease that may have other systemic features affecting other organ systems such as joints (Psoriatic Arthritis). It is associated with an increased risk of inflammatory bowel disease, heart disease, non-alcoholic fatty liver disease, and depression.    Discussed Leretha Dykes and Taclonex - none are covered by insurance. Start Enstilar foam qd   Skyrizi injection today to right abdomen - Patient injected himself today. He will do his next 3 month injection at home.  Calcipotriene-Betameth Diprop (ENSTILAR) 0.005-0.064 % FOAM Apply 1 application topically daily.  Risankizumab-rzaa SOSY 150 mg  Return in about 6 months (around 03/22/2022) for Psoriasis.  I, Ashok Cordia, CMA, am acting as scribe for Sarina Ser, MD . Documentation: I have reviewed the above documentation for accuracy and completeness, and I agree with  the above.  Sarina Ser, MD

## 2021-09-21 NOTE — Patient Instructions (Signed)

## 2021-09-21 NOTE — Telephone Encounter (Signed)
This patient has Medicare insurance and due to age unable to use any "cash pay" coupons. Insurance would not consider covering Taclonex until Calcipotriene and Betamethasone are tried separately.

## 2021-09-22 NOTE — Telephone Encounter (Signed)
Nicholas Fleming was able to get Tacolnex PA approved. Called patient's son and advised him of information.

## 2021-10-19 DIAGNOSIS — R0602 Shortness of breath: Secondary | ICD-10-CM | POA: Diagnosis not present

## 2021-10-19 DIAGNOSIS — Z1159 Encounter for screening for other viral diseases: Secondary | ICD-10-CM | POA: Diagnosis not present

## 2021-10-19 DIAGNOSIS — Z7682 Awaiting organ transplant status: Secondary | ICD-10-CM | POA: Diagnosis not present

## 2021-10-19 DIAGNOSIS — R918 Other nonspecific abnormal finding of lung field: Secondary | ICD-10-CM | POA: Diagnosis not present

## 2021-10-19 LAB — PULMONARY FUNCTION TEST

## 2021-10-20 DIAGNOSIS — Z7682 Awaiting organ transplant status: Secondary | ICD-10-CM | POA: Diagnosis not present

## 2021-10-20 DIAGNOSIS — E119 Type 2 diabetes mellitus without complications: Secondary | ICD-10-CM | POA: Diagnosis not present

## 2021-10-20 DIAGNOSIS — I10 Essential (primary) hypertension: Secondary | ICD-10-CM | POA: Diagnosis not present

## 2021-10-20 DIAGNOSIS — Z955 Presence of coronary angioplasty implant and graft: Secondary | ICD-10-CM | POA: Diagnosis not present

## 2021-10-20 DIAGNOSIS — K8020 Calculus of gallbladder without cholecystitis without obstruction: Secondary | ICD-10-CM | POA: Diagnosis not present

## 2021-10-20 DIAGNOSIS — I2510 Atherosclerotic heart disease of native coronary artery without angina pectoris: Secondary | ICD-10-CM | POA: Diagnosis not present

## 2021-10-20 DIAGNOSIS — J84112 Idiopathic pulmonary fibrosis: Secondary | ICD-10-CM | POA: Diagnosis not present

## 2021-10-20 DIAGNOSIS — Z713 Dietary counseling and surveillance: Secondary | ICD-10-CM | POA: Diagnosis not present

## 2021-10-20 DIAGNOSIS — L409 Psoriasis, unspecified: Secondary | ICD-10-CM | POA: Diagnosis not present

## 2021-10-31 ENCOUNTER — Telehealth: Payer: Self-pay | Admitting: Internal Medicine

## 2021-10-31 MED ORDER — GUAIFENESIN-CODEINE 100-10 MG/5ML PO SOLN: 5.0000 mL | Freq: Three times a day (TID) | ORAL | 0 refills | Status: DC | PRN

## 2021-10-31 NOTE — Telephone Encounter (Signed)
Has been struggling with cough and decreased appetite due to RSV infection.  Tried tussionex but too strong.  Will try codeine-guafinesin  OK to hold metformin and antihypertensives while he is not eating well.

## 2021-11-21 ENCOUNTER — Other Ambulatory Visit: Payer: Self-pay

## 2021-11-21 DIAGNOSIS — L400 Psoriasis vulgaris: Secondary | ICD-10-CM

## 2021-11-21 MED ORDER — SKYRIZI 150 MG/ML ~~LOC~~ SOSY: 150.0000 mg | PREFILLED_SYRINGE | SUBCUTANEOUS | 1 refills | Status: DC

## 2021-11-21 NOTE — Progress Notes (Signed)
Skyrizi RF

## 2022-01-26 ENCOUNTER — Telehealth: Payer: Self-pay | Admitting: Pharmacist

## 2022-01-26 DIAGNOSIS — J84112 Idiopathic pulmonary fibrosis: Secondary | ICD-10-CM

## 2022-01-26 MED ORDER — OFEV 150 MG PO CAPS: 1.0000 | ORAL_CAPSULE | Freq: Two times a day (BID) | ORAL | 1 refills | Status: DC

## 2022-01-26 NOTE — Telephone Encounter (Signed)
Patient had labs completed at [facility] on 10/19/21.  AST (Aspartate Aminotran* 10/19/2021 22  ALT (Alanine Aminotransf* 10/19/2021 12 (L)  Bilirubin, Total 10/19/2021 0.8  Alk Phos (Alkaline Phosp* 10/19/2021 48   Refill sent for OFEV to [facility]: [phone]  Dose: 150 mg twice daily  Last OV: 08/23/21 Provider: Dr. Vaughan Browner  Next OV: not yet scheduled but patient due for appt with Dr. Vaughan Browner. Routing to front desk  Knox Saliva, PharmD, MPH, BCPS Clinical Pharmacist (Rheumatology and Pulmonology)

## 2022-01-26 NOTE — Telephone Encounter (Signed)
Mireya from [facility] on refill for [employer]. [place] phone number is [phone] option 2 then option 1.

## 2022-01-27 NOTE — Telephone Encounter (Addendum)
Called [facility] regarding Ofev prescription. They have rx on file. Nothing needed from provider's end. Routing to scheduling team as patient needs appt  Phone: [phone], option 2, option 4  Knox Saliva, PharmD, MPH, BCPS Clinical Pharmacist (Rheumatology and Pulmonology)

## 2022-01-30 ENCOUNTER — Telehealth: Payer: Self-pay | Admitting: Internal Medicine

## 2022-01-30 MED ORDER — PAXLOVID (300/100) 20 X 150 MG & 10 X 100MG PO TBPK: ORAL_TABLET | ORAL | 0 refills | Status: DC

## 2022-01-30 NOTE — Telephone Encounter (Signed)
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  Will send in Rx for paxlovid.

## 2022-01-31 NOTE — Telephone Encounter (Signed)
Patient scheduled on 02/10/2022. Nothing further needed.

## 2022-02-10 ENCOUNTER — Encounter: Payer: Self-pay | Admitting: Pulmonary Disease

## 2022-02-10 ENCOUNTER — Other Ambulatory Visit: Payer: Self-pay

## 2022-02-10 ENCOUNTER — Ambulatory Visit (INDEPENDENT_AMBULATORY_CARE_PROVIDER_SITE_OTHER): Payer: Medicare HMO | Admitting: Pulmonary Disease

## 2022-02-10 VITALS — BP 110/60 | HR 103 | Temp 97.5°F | Ht 66.0 in | Wt 145.0 lb

## 2022-02-10 DIAGNOSIS — J84112 Idiopathic pulmonary fibrosis: Secondary | ICD-10-CM

## 2022-02-10 DIAGNOSIS — Z5181 Encounter for therapeutic drug level monitoring: Secondary | ICD-10-CM

## 2022-02-10 MED ORDER — PANTOPRAZOLE SODIUM 40 MG PO TBEC: 40.0000 mg | DELAYED_RELEASE_TABLET | Freq: Every day | ORAL | 5 refills | Status: DC

## 2022-02-10 MED ORDER — BENZONATATE 200 MG PO CAPS: 200.0000 mg | ORAL_CAPSULE | Freq: Two times a day (BID) | ORAL | 1 refills | Status: DC | PRN

## 2022-02-10 NOTE — Progress Notes (Signed)
Nicholas Fleming    [id number]    Dec 28, 1945  Primary Care Physician:Hoffman, Rachel Moulds, DO  Referring Physician: [name], DO [address]  [place],  [place] [address]  Chief complaint: Follow-up for IPF  HPI: 76 year old with history of angina, hypertension, diabetes, hyperlipidemia, psoriasis.  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  Cough is nonproductive in nature associated with mild dyspnea on exertion.  He has history of angina, coronary artery disease with a stent placement many years ago.  He has longstanding psoriasis presenting as rash.  He was on methotrexate for 2 years around 2013 and self discontinued due to concern for side effects.  Recently has been started on [place] with improvement in rash.  He developed COVID-19 infection at the end of December 2020 and treated with monoclonal antibody on 12/29 with improvement in symptoms. Started on Ofev at the end of January 2021.  He is tolerating it well with no GI issues.  Evaluated by GI for an unexplained 10 to 20 pound weight loss.  Underwent EGD and colonoscopy in 03/09/20 with findings of benign colon polyps.    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  No mold, hot tub, Jacuzzi, no down pillows or comforter ILD questionnaire 01/12/2020-negative Smoking history: Used to smoke socially.  Quit many years ago Travel history: Originally from [place].  He had lived in [place] for over 30 years and now spends his time between [place] and [place] Relevant family history: No significant family history of lung disease  Interval history: Complains of increasing cough for the past 6 months.  He had been exposed to RSV from his grandson when  this began Cough is nonproductive in nature.  Dyspnea on exertion is stable  Got treated with Paxlovid 3 weeks ago after exposure to COVID-19 at home  He had an evaluation at [facility] in November 2023  Outpatient Encounter Medications as of 02/10/2022  Medication Sig   albuterol (PROVENTIL HFA) 108 (90 Base) MCG/ACT inhaler Inhale 1-2 puffs into the lungs every 6 (six) hours as needed for wheezing or shortness of breath.   aspirin EC 81 MG tablet Take 81 mg by mouth daily.   budesonide-formoterol (SYMBICORT) 80-4.5 MCG/ACT inhaler Inhale 2 puffs into the lungs 2 (two) times daily.   Calcipotriene-Betameth Diprop (ENSTILAR) 0.005-0.064 % FOAM Apply 1 application topically daily.   levothyroxine (SYNTHROID) 50 MCG tablet Take 1 tablet (50 mcg total) by mouth daily.   metFORMIN (GLUCOPHAGE) 1000 MG tablet Take 1 tablet (1,000 mg total) by mouth daily with breakfast.   metoprolol succinate (TOPROL-XL) 50 MG 24 hr tablet Take 1 tablet (50 mg total) by mouth daily. Take with or immediately following a meal.   Nintedanib (OFEV) 150 MG CAPS Take 1 capsule (150 mg total) by mouth 2 (two) times daily.   nirmatrelvir & ritonavir (PAXLOVID, 300/100,) 20 x 150 MG & 10 x 100MG  TBPK Nirmatrelvir 300 mg with ritonavir 100 mg, administered together, twice daily for 5 days   Risankizumab-rzaa (SKYRIZI) 150 MG/ML SOSY Inject 150 mg into the skin as directed. Every 12 weeks for maintenance.   rosuvastatin (CRESTOR) 20 MG tablet Take 1 tablet (20 mg total)  by mouth daily.   telmisartan (MICARDIS) 20 MG tablet Take 1 tablet (20 mg total) by mouth daily.   guaiFENesin-codeine 100-10 MG/5ML syrup Take 5 mLs by mouth 3 (three) times daily as needed for cough. (Patient not taking: Reported on 02/10/2022)   No facility-administered encounter medications on file as of 02/10/2022.   Physical Exam: Blood pressure 122/68, pulse 74, temperature 97.6 F (36.4 C), temperature source Temporal, height 5\' 6"  (1.676 m), weight 163 lb  12.8 oz (74.3 kg), SpO2 99 %. Gen:      No acute distress HEENT:  EOMI, sclera anicteric Neck:     No masses; no thyromegaly Lungs:    Bibasal crackles CV:         Regular rate and rhythm; no murmurs Abd:      + bowel sounds; soft, non-tender; no palpable masses, no distension Ext:    No edema; adequate peripheral perfusion Skin:      Warm and dry; no rash Neuro: alert and oriented x 3 Psych: normal mood and affect  Data Reviewed: Imaging: High-res CT 11/19/2019-peripheral and basal subpleural reticulation with groundglass, traction bronchiectasis.  Probable honeycombing.  5 mm lung nodule.  Probable UIP pattern  CT abdomen 03/17/2020-stable pulmonary fibrosis at the base.  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  07/26/2021 FVC 1.48 [41%], FEV1 1.31 [50%], F/F 88, TLC 2.78 [44%], DLCO 10.37 [46%] Severe restriction, severe diffusion defect.  Worsening compared to 2021  PFTs from [facility] 08/19/2021 FVC 1.53 [49%], FEV1 1.24 [51%], F/F 81, TLC 3.59 [56.7%], DLCO 11.45 [51.8%] Moderate obstruction with mild diffusion defect  6-minute walk 01/12/2020-408 m, nadir O2 sat of 92%  Labs: CBC 10/22 -WBC 10.4, eos 6%, absolute eosinophil count 624  CTD serologies 12/02/19-negative  Hepatic panel from [facility] 10/19/2021-within normal limits  Cardiac: Echocardiogram 12/23/1094 Normal LV systolic function with EF 04%, grade 1 diastolic dysfunction, PA pressures not estimated due to absence of TR.  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  No known exposures or signs and symptoms of connective tissue disease Serologies for CTD are negative Reviewed at multidisciplinary conference on 12/30/19.  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  Continues on OFev Ofev.  He is tolerating it well Hepatic panel in October from [facility] is within normal limits Referred for lung transplant He would like to put off pulmonary rehab as he remains active at home with daily exercise and walking  Cough Has worsening cough for 6 months. Start Protonix for treatment of GERD.  Use Delsym, Tessalon Repeat high-res CT ordered  Plan/Recommendations: - Continue Ofev - Protonix, Tessalon, Delsym  Marshell Garfinkel MD [facility] 02/10/2022, 11:22 AM  CC: [name], DO

## 2022-02-10 NOTE — Patient Instructions (Signed)
I am glad you are stable with regard to your breathing I have reviewed your [facility] visit notes and lung function test which actually showed improvement which is good news We will start you on Protonix 40 mg a day for acid reflux Prescribe Tessalon 200 twice daily as needed for cough You can use Delsym over-the-counter We will schedule high-res CT Follow-up in 3 months with video visit.

## 2022-02-28 ENCOUNTER — Other Ambulatory Visit: Payer: Self-pay

## 2022-02-28 ENCOUNTER — Ambulatory Visit
Admission: RE | Admit: 2022-02-28 | Discharge: 2022-02-28 | Disposition: A | Discharge status: Home / Self Care | Payer: Medicare HMO | Source: Ambulatory Visit | Attending: Pulmonary Disease | Admitting: Pulmonary Disease

## 2022-02-28 DIAGNOSIS — J479 Bronchiectasis, uncomplicated: Secondary | ICD-10-CM | POA: Diagnosis not present

## 2022-02-28 DIAGNOSIS — J84112 Idiopathic pulmonary fibrosis: Secondary | ICD-10-CM | POA: Diagnosis not present

## 2022-02-28 DIAGNOSIS — I2510 Atherosclerotic heart disease of native coronary artery without angina pectoris: Secondary | ICD-10-CM | POA: Diagnosis not present

## 2022-02-28 DIAGNOSIS — I281 Aneurysm of pulmonary artery: Secondary | ICD-10-CM | POA: Diagnosis not present

## 2022-03-05 NOTE — Progress Notes (Signed)
I am seeing him tomorrow, will address

## 2022-03-05 NOTE — Progress Notes (Signed)
High-resolution CT chest 03/01/2022: ?1. Lower lung and subpleural predominant fibrotic changes with no ?evidence of progression when compared with prior exam. Findings are categorized as probable UIP per consensus guidelines: Diagnosis of Idiopathic Pulmonary Fibrosis: An Official ATS/ERS/JRS/ALAT Clinical Practice Guideline. [place], Iss 5, [phone], Aug 18 2017. ?2. Cholelithiasis ?3. Cardiovascular: Cardiomegaly. No pericardial effusion. Atherosclerotic disease of the thoracic aorta. Dilated main ?pulmonary artery, measuring up to 3.6 cm, unchanged grade to prior ?exam. Left main and three-vessel coronary artery calcifications.

## 2022-03-05 NOTE — Progress Notes (Signed)
? ?Primary Physician/Referring:  [name], DO ? ?Patient ID: Nicholas Fleming, male    DOB: 05-01-1946, 76 y.o.   MRN: [id number] ? ?Chief Complaint  ?Patient presents with  ? Coronary Artery Disease  ? Shortness of Breath  ? Follow-up  ?  6 months  ? ?HPI:   ? ?Nicholas Fleming  is a 76 y.o. [place] male with history of CAD with stents placed in [place] in 2015 (unsure of where and what type of stent), hypertension, hyperlipidemia, diabetes mellitus type 2 presently (well controlled), psoriasis, and IPF (followed by Dr. Vaughan Browner), benign colon polyps on colonoscopy in 02/2020. Patient has history of Covid-19 infection 11/2020. High-resolution CT revealing mild pulmonary artery dilatation, aortic atherosclerosis in addition to left main and three-vessel coronary artery calcification.  ? ?Patient is presently on guideline directed medical therapy for CAD including telmisartan, rosuvastatin, metoprolol succinate, as well as aspirin.  His dyspnea has remained stable.  No PND or orthopnea.  Denies leg edema.   He follows closely with pulmonology for management of IPF. Patient denies chest pain, palpitations, orthopnea, PND, syncope, near syncope, dizziness. ? ?Past Medical History:  ?Diagnosis Date  ? Allergy   ? Asthma   ? Bronchitis   ? Controlled type 2 diabetes mellitus ([place]) 10/09/2019  ? Essential hypertension 10/09/2019  ? Hypothyroidism 10/09/2019  ? Psoriasis   ? ?Social History  ? ?Tobacco Use  ? Smoking status: Former  ?  Packs/day: 0.25  ?  Years: 1.00  ?  Pack years: 0.25  ?  Types: Cigarettes  ?  Quit date: 1964  ?  Years since quitting: 59.2  ? Smokeless tobacco: Never  ?Substance Use Topics  ? Alcohol use: Yes  ?  Comment: 1 to2 times per week  ? ?Marital Status: Married  ? ?ROS  ?Review of Systems  ?Cardiovascular:  Positive for dyspnea on exertion. Negative for chest pain and leg swelling.  ? ?Objective  ?Blood pressure 120/74, pulse 83, temperature (!) 97.4 ?F (36.3 ?C), temperature source  Temporal, resp. rate 16, height '5\' 6"'$  (1.676 m), weight 145 lb 3.2 oz (65.9 kg).  ?Vitals with BMI 03/06/2022 02/10/2022 09/06/2021  ?Height '5\' 6"'$  '5\' 6"'$  '5\' 6"'$   ?Weight 145 lbs 3 oz 145 lbs 158 lbs  ?BMI 23.45 23.41 25.51  ?Systolic 937 342 876  ?Diastolic 74 60 74  ?Pulse 83 103 75  ?  ? Physical Exam ?Cardiovascular:  ?   Rate and Rhythm: Normal rate and regular rhythm.  ?   Pulses: Intact distal pulses.     ?     Carotid pulses are 2+ on the right side and 2+ on the left side. ?     Radial pulses are 2+ on the right side and 2+ on the left side.  ?     Femoral pulses are 2+ on the right side and 2+ on the left side. ?     Popliteal pulses are 2+ on the right side and 2+ on the left side.  ?     Dorsalis pedis pulses are 2+ on the right side and 2+ on the left side.  ?     Posterior tibial pulses are 2+ on the right side and 2+ on the left side.  ?   Heart sounds: S1 normal and S2 normal. No murmur heard. ?  No gallop.  ?Pulmonary:  ?   Effort: Pulmonary effort is normal. No respiratory distress.  ?   Breath sounds: Examination of the right-lower  field reveals rales. Examination of the left-lower field reveals rales. Rales present.  ?Abdominal:  ?   General: Bowel sounds are normal.  ?   Palpations: Abdomen is soft.  ?Musculoskeletal:  ?   Right lower leg: No edema.  ?   Left lower leg: No edema.  ? ?Laboratory examination:  ? ?CMP Latest Ref Rng & Units 08/25/2021 04/07/2021 09/01/2020  ?Glucose 65 - 99 mg/dL 117(H) 132(H) 121(H)  ?BUN 8 - 27 mg/dL '11 11 19  '$ ?Creatinine 0.76 - 1.27 mg/dL 0.77 0.95 0.96  ?Sodium 134 - 144 mmol/L 136 139 134(L)  ?Potassium 3.5 - 5.2 mmol/L 4.2 4.4 4.3  ?Chloride 96 - 106 mmol/L 102 100 99  ?CO2 20 - 29 mmol/L '21 22 26  '$ ?Calcium 8.6 - 10.2 mg/dL 8.9 9.1 9.2  ?Total Protein 6.0 - 8.5 g/dL 6.0 6.5 7.1  ?Total Bilirubin 0.0 - 1.2 mg/dL 0.4 0.6 0.7  ?Alkaline Phos 44 - 121 IU/L 46 50 54  ?AST 0 - 40 IU/L 21 19 38  ?ALT 0 - 44 IU/L 14 14 47(H)  ? ?CBC Latest Ref Rng & Units 04/07/2021 02/12/2020  10/09/2019  ?WBC 3.4 - 10.8 x10E3/uL 12.3(H) 8.5 10.4  ?Hemoglobin 13.0 - 17.7 g/dL 16.5 15.3 15.8  ?Hematocrit 37.5 - 51.0 % 48.8 45.3 47.0  ?Platelets 150 - 450 x10E3/uL 232 250.0 233  ? ?Lipid Panel  ?   ?Component Value Date/Time  ? CHOL 108 07/27/2021 1539  ? TRIG 83 07/27/2021 1539  ? HDL 45 07/27/2021 1539  ? LDLCALC 46 07/27/2021 1539  ? ?HEMOGLOBIN A1C ?Lab Results  ?Component Value Date  ? HGBA1C 6.1 (A) 08/25/2021  ? ?TSH ?Recent Labs  ?  08/25/21 ?1204  ?TSH 3.740  ? ?BNP ?   ?Component Value Date/Time  ? BNP 56.8 08/25/2021 1204  ? ?External labs:  ? ?02/12/2020: ?TSH 1.61 ? ?Allergies  ?No Known Allergies  ?Final Medications at End of Visit   ? ? ?Current Outpatient Medications:  ?  albuterol (PROVENTIL HFA) 108 (90 Base) MCG/ACT inhaler, Inhale 1-2 puffs into the lungs every 6 (six) hours as needed for wheezing or shortness of breath., Disp: 18 g, Rfl: 3 ?  aspirin EC 81 MG tablet, Take 81 mg by mouth daily., Disp: , Rfl:  ?  benzonatate (TESSALON) 200 MG capsule, Take 1 capsule (200 mg total) by mouth 2 (two) times daily as needed for cough., Disp: 60 capsule, Rfl: 1 ?  budesonide-formoterol (SYMBICORT) 80-4.5 MCG/ACT inhaler, Inhale 2 puffs into the lungs 2 (two) times daily., Disp: 30.6 g, Rfl: 3 ?  guaiFENesin-codeine 100-10 MG/5ML syrup, Take 5 mLs by mouth 3 (three) times daily as needed for cough., Disp: 120 mL, Rfl: 0 ?  levothyroxine (SYNTHROID) 50 MCG tablet, Take 1 tablet (50 mcg total) by mouth daily., Disp: 90 tablet, Rfl: 3 ?  metFORMIN (GLUCOPHAGE) 1000 MG tablet, Take 1 tablet (1,000 mg total) by mouth daily with breakfast., Disp: 90 tablet, Rfl: 3 ?  metoprolol succinate (TOPROL-XL) 50 MG 24 hr tablet, Take 1 tablet (50 mg total) by mouth daily. Take with or immediately following a meal., Disp: 90 tablet, Rfl: 3 ?  Nintedanib (OFEV) 150 MG CAPS, Take 1 capsule (150 mg total) by mouth 2 (two) times daily., Disp: 60 capsule, Rfl: 1 ?  pantoprazole (PROTONIX) 40 MG tablet, Take 1 tablet  (40 mg total) by mouth daily., Disp: 30 tablet, Rfl: 5 ?  Risankizumab-rzaa (SKYRIZI) 150 MG/ML SOSY, Inject 150 mg into the  skin as directed. Every 12 weeks for maintenance., Disp: 1 mL, Rfl: 1 ?  rosuvastatin (CRESTOR) 20 MG tablet, Take 1 tablet (20 mg total) by mouth daily., Disp: 90 tablet, Rfl: 3 ?  telmisartan (MICARDIS) 20 MG tablet, Take 1 tablet (20 mg total) by mouth daily., Disp: 90 tablet, Rfl: 3  ? ?Radiology:  ? ?High-resolution CT chest 03/01/2022: ?1. Lower lung and subpleural predominant fibrotic changes with no evidence of progression when compared with prior exam 03/29/2021. Findings are categorized as probable UIP per consensus guidelines: Diagnosis of Idiopathic Pulmonary Fibrosis: An Official ATS/ERS/JRS/ALAT Clinical Practice Guideline. [place], Iss 5, [phone], Aug 18 2017. ?2. Cholelithiasis ?3. Cardiovascular: Cardiomegaly. No pericardial effusion. Atherosclerotic disease of the thoracic aorta. Dilated main pulmonary artery, measuring up to 3.6 cm, unchanged grade to prior ?exam. Left main and three-vessel coronary artery calcifications. ? ?Cardiac Studies:  ? ?Exercise nuclear stress test 07/25/2021: ?Normal ECG stress. The patient exercised for 2 minutes and 21 seconds of a Bruce protocol, achieving approximately 4.64 METs. Terhe were occasional PVC and V-couplets during peak exercise. No chest pain. Symptoms included dyspnea. Normal BP response.  ?Myocardial perfusion is abnormal. ?There is a very small, mild reversible mild defect in the inferior region. TID is abnormal at 1.21.   ?Overall LV systolic function is abnormal without regional wall motion abnormalities. Stress LV EF: 46%.  ?No previous exam available for comparison. Intermediate risk study. ? ?Echocardiogram 07/19/2021:  ?1. Normal LV systolic function with EF 72%. Left ventricle cavity is normal in size. Normal global wall motion. Doppler evidence of grade I (impaired) diastolic dysfunction, normal  LAP. Calculated EF 72%.  ?2. Left atrial cavity is mildly dilated.  ?3. The aortic root is normal in size. Mild atherosclerotic changes in the aorta.  ?4. No significant valvular abnormality.  ?5. PA pressure

## 2022-03-06 ENCOUNTER — Ambulatory Visit: Payer: Medicare HMO | Admitting: Cardiology

## 2022-03-06 ENCOUNTER — Other Ambulatory Visit: Payer: Self-pay

## 2022-03-06 ENCOUNTER — Encounter: Payer: Self-pay | Admitting: Cardiology

## 2022-03-06 VITALS — BP 120/74 | HR 83 | Temp 97.4°F | Resp 16 | Ht 66.0 in | Wt 145.2 lb

## 2022-03-06 DIAGNOSIS — R0609 Other forms of dyspnea: Secondary | ICD-10-CM | POA: Diagnosis not present

## 2022-03-06 DIAGNOSIS — J84112 Idiopathic pulmonary fibrosis: Secondary | ICD-10-CM | POA: Diagnosis not present

## 2022-03-06 DIAGNOSIS — I25118 Atherosclerotic heart disease of native coronary artery with other forms of angina pectoris: Secondary | ICD-10-CM

## 2022-03-06 DIAGNOSIS — E7800 Pure hypercholesterolemia, unspecified: Secondary | ICD-10-CM | POA: Diagnosis not present

## 2022-03-06 DIAGNOSIS — I10 Essential (primary) hypertension: Secondary | ICD-10-CM

## 2022-03-15 ENCOUNTER — Other Ambulatory Visit: Payer: Self-pay | Admitting: Pharmacist

## 2022-03-15 DIAGNOSIS — J84112 Idiopathic pulmonary fibrosis: Secondary | ICD-10-CM

## 2022-03-15 DIAGNOSIS — Z5181 Encounter for therapeutic drug level monitoring: Secondary | ICD-10-CM

## 2022-03-15 MED ORDER — OFEV 150 MG PO CAPS: 1.0000 | ORAL_CAPSULE | Freq: Two times a day (BID) | ORAL | 1 refills | Status: DC

## 2022-03-15 NOTE — Telephone Encounter (Signed)
Refill sent for OFEV to [facility] (pulmonary fibrosis team). [phone] ? ?Dose: 150 mg twice daily ? ?Last OV: 02/10/22 ?Provider: Dr. Vaughan Browner ? ?Next OV: 05/10/22 ? ?Patient due for labs. Future lab order placed. ATC patient - left VM requesting return call ? ?Knox Saliva, PharmD, MPH, BCPS ?Clinical Pharmacist (Rheumatology and Pulmonology) ? ?

## 2022-03-23 ENCOUNTER — Ambulatory Visit: Payer: Medicare HMO | Admitting: Dermatology

## 2022-03-23 DIAGNOSIS — Z79899 Other long term (current) drug therapy: Secondary | ICD-10-CM | POA: Diagnosis not present

## 2022-03-23 DIAGNOSIS — L818 Other specified disorders of pigmentation: Secondary | ICD-10-CM

## 2022-03-23 DIAGNOSIS — B351 Tinea unguium: Secondary | ICD-10-CM | POA: Diagnosis not present

## 2022-03-23 DIAGNOSIS — Z1283 Encounter for screening for malignant neoplasm of skin: Secondary | ICD-10-CM

## 2022-03-23 DIAGNOSIS — L409 Psoriasis, unspecified: Secondary | ICD-10-CM

## 2022-03-23 DIAGNOSIS — L821 Other seborrheic keratosis: Secondary | ICD-10-CM

## 2022-03-23 DIAGNOSIS — B353 Tinea pedis: Secondary | ICD-10-CM | POA: Diagnosis not present

## 2022-03-23 MED ORDER — RISANKIZUMAB-RZAA 150 MG/ML ~~LOC~~ SOSY
150.0000 mg | PREFILLED_SYRINGE | Freq: Once | SUBCUTANEOUS | Status: AC
  Administered 2022-03-23: 150 mg via SUBCUTANEOUS

## 2022-03-23 MED ORDER — TAVABOROLE 5 % EX SOLN: 1.0000 "application " | Freq: Every day | CUTANEOUS | 11 refills | Status: DC

## 2022-03-23 MED ORDER — KETOCONAZOLE 2 % EX CREA: 1.0000 "application " | TOPICAL_CREAM | Freq: Every day | CUTANEOUS | 3 refills | Status: DC

## 2022-03-23 NOTE — Patient Instructions (Addendum)
For toenails and feet ?Start ketoconazole 2% crean nightly to feet ?Start Kerydin solution to affected toenails nightly ? ? ?If You Need Anything After Your Visit ? ?If you have any questions or concerns for your doctor, please call our main line at [phone] and press option 4 to reach your doctor's medical assistant. If no one answers, please leave a voicemail as directed and we will return your call as soon as possible. Messages left after 4 pm will be answered the following business day.  ? ?You may also send [place] a message via MyChart. We typically respond to MyChart messages within 1-2 business days. ? ?For prescription refills, please ask your pharmacy to contact our office. Our fax number is [phone]. ? ?If you have an urgent issue when the clinic is closed that cannot wait until the next business day, you can page your doctor at the number below.   ? ?Please note that while we do our best to be available for urgent issues outside of office hours, we are not available 24/7.  ? ?If you have an urgent issue and are unable to reach [place], you may choose to seek medical care at your doctor's office, retail clinic, urgent care center, or emergency room. ? ?If you have a medical emergency, please immediately call 911 or go to the emergency department. ? ?Pager Numbers ? ?- Dr. Nehemiah Massed: [phone] ? ?- Dr. Laurence Ferrari: [phone] ? ?- Dr. Nicole Kindred: [phone] ? ?In the event of inclement weather, please call our main line at [phone] for an update on the status of any delays or closures. ? ?Dermatology Medication Tips: ?Please keep the boxes that topical medications come in in order to help keep track of the instructions about where and how to use these. Pharmacies typically print the medication instructions only on the boxes and not directly on the medication tubes.  ? ?If your medication is too expensive, please contact our office at [phone] option 4 or send [place] a message through [place].  ? ?We are  unable to tell what your co-pay for medications will be in advance as this is different depending on your insurance coverage. However, we may be able to find a substitute medication at lower cost or fill out paperwork to get insurance to cover a needed medication.  ? ?If a prior authorization is required to get your medication covered by your insurance company, please allow [place] 1-2 business days to complete this process. ? ?Drug prices often vary depending on where the prescription is filled and some pharmacies may offer cheaper prices. ? ?The website www.goodrx.com contains coupons for medications through different pharmacies. The prices here do not account for what the cost may be with help from insurance (it may be cheaper with your insurance), but the website can give you the price if you did not use any insurance.  ?- You can print the associated coupon and take it with your prescription to the pharmacy.  ?- You may also stop by our office during regular business hours and pick up a GoodRx coupon card.  ?- If you need your prescription sent electronically to a different pharmacy, notify our office through [facility] or by phone at [phone] option 4. ? ? ? ? ?Si Usted Necesita Algo Despu?s de Su Visita ? ?Tambi?n puede enviarnos un mensaje a trav?s de MyChart. Por lo general respondemos a los mensajes de MyChart en el transcurso de 1 a 2 d?as h?biles. ? ?Para renovar recetas, por favor pida a  su farmacia que se ponga en contacto con nuestra oficina. Nuestro n?mero de fax es el [phone]. ? ?Si tiene un asunto urgente cuando la cl?nica est? cerrada y que no puede esperar hasta el siguiente d?a h?bil, puede llamar/localizar a su doctor(a) al n?mero que aparece a continuaci?n.  ? ?Por favor, tenga en cuenta que aunque hacemos todo lo posible para estar disponibles para asuntos urgentes fuera del horario de oficina, no estamos disponibles las 24 horas del d?a, los 7 d?as de la semana.  ? ?Si tiene un  problema urgente y no puede comunicarse con nosotros, puede optar por buscar atenci?n m?dica  en el consultorio de su doctor(a), en una cl?nica privada, en un centro de atenci?n urgente o en una sala de emergencias. ? ?Si tiene Engineer, maintenance (IT) m?dica, por favor llame inmediatamente al 911 o vaya a la sala de emergencias. ? ?N?meros de b?per ? ?- Dr. Nehemiah Massed: [phone] ? ?- Dra. Moye: [phone] ? ?- Dra. Nicole Kindred: [phone] ? ?En caso de inclemencias del tiempo, por favor llame a nuestra l?nea principal al [phone] para una actualizaci?n sobre el estado de cualquier retraso o cierre. ? ?Consejos para la medicaci?n en dermatolog?a: ?Por favor, guarde las cajas en las que vienen los medicamentos de uso t?pico para ayudarle a seguir las instrucciones sobre d?nde y c?mo usarlos. Las farmacias generalmente imprimen las instrucciones del medicamento s?lo en las cajas y no directamente en los tubos del [place].  ? ?Si su medicamento es muy caro, por favor, p?ngase en contacto con Zigmund Daniel llamando al [phone] y presione la opci?n 4 o env?enos un mensaje a trav?s de MyChart.  ? ?No podemos decirle cu?l ser? su copago por los medicamentos por adelantado ya que esto es diferente dependiendo de la cobertura de su seguro. Sin embargo, es posible que podamos encontrar un medicamento sustituto a Electrical engineer un formulario para que el seguro cubra el medicamento que se considera necesario.  ? ?Si se requiere Ardelia Mems autorizaci?n previa para que su compa??a de seguros [place] su medicamento, por favor perm?tanos de 1 a 2 d?as h?biles para completar este proceso. ? ?Los precios de los medicamentos var?an con frecuencia dependiendo del Environmental consultant de d?nde se surte la receta y alguna farmacias pueden ofrecer precios m?s baratos. ? ?El sitio web www.goodrx.com tiene cupones para medicamentos de Airline pilot. Los precios aqu? no tienen en cuenta lo que podr?a costar con la ayuda del seguro (puede ser m?s  barato con su seguro), pero el sitio web puede darle el precio si no utiliz? ning?n seguro.  ?- Puede imprimir el cup?n correspondiente y llevarlo con su receta a la farmacia.  ?- Tambi?n puede pasar por nuestra oficina durante el horario de atenci?n regular y recoger una tarjeta de cupones de GoodRx.  ?- Si necesita que su receta se env?e electr?nicamente a Chiropodist, informe a nuestra oficina a trav?s de MyChart de [place] o por tel?fono llamando al [phone] y presione la opci?n 4.  ?

## 2022-03-23 NOTE — Progress Notes (Signed)
? ?Follow-Up Visit ?  ?Subjective  ?Nicholas Fleming is a 76 y.o. male who presents for the following: Psoriasis (Arms, Skyrizi injections q 3 months, Enstilar prn, 68mf/u) and Total body skin exam. ? ?Patient accompanied [place] Daughter in [place] ? ?The patient presents for Total-Body Skin Exam (TBSE) for skin cancer screening and mole check.  The patient has spots, moles and lesions to be evaluated, some may be new or changing and the patient has concerns that these could be cancer.  ? ?The following portions of the chart were reviewed this encounter and updated as appropriate:  ? Tobacco  Allergies  Meds  Problems  Med Hx  Surg Hx  Fam Hx   ?  ?Review of Systems:  No other skin or systemic complaints except as noted in HPI or Assessment and Plan. ? ?Objective  ?Well appearing patient in no apparent distress; mood and affect are within normal limits. ? ?A full examination was performed including scalp, head, eyes, ears, nose, lips, neck, chest, axillae, abdomen, back, buttocks, bilateral upper extremities, bilateral lower extremities, hands, feet, fingers, toes, fingernails, and toenails. All findings within normal limits unless otherwise noted below. ? ?Right Upper Arm - Anterior ?1 guttate spot L elbow, pipa on elbows, knees, scaly patches scalp ? ?bil feet, toenails ?Scaling and maceration web spaces and over distal and lateral soles, toenail dystrophy ? ? ?Assessment & Plan  ? ?Seborrheic Keratoses ?- Stuck-on, waxy, tan-brown papules and/or plaques  ?- Benign-appearing ?- Discussed benign etiology and prognosis. ?- Observe ?- Call for any changes ? ?Psoriasis ?With postinflammatory pigment alteration ?Right Upper Arm - Anterior ?Much improved on Skyrizi. ?Psoriasis - severe on systemic ?biologic? treatment injections.  Psoriasis is a chronic non-curable, but treatable genetic/hereditary disease that may have other systemic features affecting other organ systems such as joints (Psoriatic Arthritis).  It is  linked with heart disease, inflammatory bowel disease, non-alcoholic fatty liver disease, and depression. Significant skin psoriasis and/or psoriatic arthritis may have significant symptoms and affects activities of daily activity and often benefits from systemic ?biologic? injection treatments.  These ?biologic? treatments have some potential side effects including immunosuppression and require pre-treatment laboratory screening and periodic laboratory monitoring and periodic in person evaluation and monitoring by the attending dermatologist physician (long term medication management).  ? ?TB labs due 10/2022 ?<1% BSA ? ?Cont Skyrizi '150mg'$ /ml sq injection, given today to L upper arm  Lot [id number] 03/2023.  Pt will continue to self inject in future. ?Cont Enstilar Foam qd prn flares ? ?Discussed Sotyktu, pt would like to start an oral medication rather then injectable due to he travels out of the country for ~487mt a time and trying to travel with the injectable medication would be hard.  Discussed with patient and daughter in law that if pt had to push back injection 1-2 months due to traveling out of the country that would not be a problem. ? ?Reviewed risks of biologics including immunosuppression, infections, injection site reaction, and failure to improve condition. Goal is control of skin condition, not cure.  Some older biologics such as Humira and Enbrel may slightly increase risk of malignancy and may worsen congestive heart failure. The use of biologics requires long term medication management, including periodic office visits and monitoring of blood work.  ? ?Topical steroids (such as triamcinolone, fluocinolone, fluocinonide, mometasone, clobetasol, halobetasol, betamethasone, hydrocortisone) can cause thinning and lightening of the skin if they are used for too long in the same area. Your physician has  selected the right strength medicine for your problem and area affected on the body. Please use  your medication only as directed by your physician to prevent side effects.   ? ?Risankizumab-rzaa SOSY 150 mg - Right Upper Arm - Anterior ? 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 Condition is symptomatic / bothersome to patient. Not to goal. ? ?Start ketoconazole 2% cr qhs to feet ?Start Kerydin solution to toenails qhs ? ?ketoconazole (NIZORAL) 2 % cream - bil feet, toenails ?Apply 1 application. topically daily. Qhs to feet ? ?Tavaborole (KERYDIN) 5 % SOLN - bil feet, toenails ?Apply 1 application. topically at bedtime. Apply to affected toenails nightly ? ?Skin cancer screening ?Return in about 6 months (around 09/22/2022) for Psoriasis f/u. ? ?I, Othelia Pulling, RMA, am acting as scribe for Sarina Ser, MD . ?Documentation: I have reviewed the above documentation for accuracy and completeness, and I agree with the above. ? ?Sarina Ser, MD ? ?

## 2022-03-27 ENCOUNTER — Encounter: Payer: Self-pay | Admitting: Dermatology

## 2022-04-24 ENCOUNTER — Other Ambulatory Visit: Payer: Self-pay | Admitting: Internal Medicine

## 2022-04-24 MED ORDER — ROSUVASTATIN CALCIUM 20 MG PO TABS: 20.0000 mg | ORAL_TABLET | Freq: Every day | ORAL | 3 refills | Status: DC

## 2022-05-04 ENCOUNTER — Other Ambulatory Visit: Payer: Self-pay

## 2022-05-04 ENCOUNTER — Ambulatory Visit (INDEPENDENT_AMBULATORY_CARE_PROVIDER_SITE_OTHER): Payer: Medicare HMO | Admitting: Internal Medicine

## 2022-05-04 ENCOUNTER — Encounter: Payer: Self-pay | Admitting: Internal Medicine

## 2022-05-04 VITALS — BP 116/68 | HR 94 | Ht 66.0 in | Wt 141.8 lb

## 2022-05-04 DIAGNOSIS — L400 Psoriasis vulgaris: Secondary | ICD-10-CM

## 2022-05-04 DIAGNOSIS — E039 Hypothyroidism, unspecified: Secondary | ICD-10-CM

## 2022-05-04 DIAGNOSIS — R140 Abdominal distension (gaseous): Secondary | ICD-10-CM

## 2022-05-04 DIAGNOSIS — R634 Abnormal weight loss: Secondary | ICD-10-CM

## 2022-05-04 DIAGNOSIS — I10 Essential (primary) hypertension: Secondary | ICD-10-CM | POA: Diagnosis not present

## 2022-05-04 DIAGNOSIS — Z87891 Personal history of nicotine dependence: Secondary | ICD-10-CM

## 2022-05-04 DIAGNOSIS — R600 Localized edema: Secondary | ICD-10-CM

## 2022-05-04 DIAGNOSIS — Z23 Encounter for immunization: Secondary | ICD-10-CM | POA: Diagnosis not present

## 2022-05-04 DIAGNOSIS — E119 Type 2 diabetes mellitus without complications: Secondary | ICD-10-CM | POA: Diagnosis not present

## 2022-05-04 LAB — POCT GLYCOSYLATED HEMOGLOBIN (HGB A1C): Hemoglobin A1C: 6.2 % — AB (ref 4.0–5.6)

## 2022-05-04 LAB — GLUCOSE, CAPILLARY: Glucose-Capillary: 142 mg/dL — ABNORMAL HIGH (ref 70–99)

## 2022-05-04 LAB — BRAIN NATRIURETIC PEPTIDE: B Natriuretic Peptide: 29.7 pg/mL (ref 0.0–100.0)

## 2022-05-04 MED ORDER — SKYRIZI 150 MG/ML ~~LOC~~ SOSY: 150.0000 mg | PREFILLED_SYRINGE | SUBCUTANEOUS | 1 refills | Status: DC

## 2022-05-04 NOTE — Progress Notes (Signed)
Established Patient Office Visit  Subjective   Patient ID: Nicholas Fleming, male    DOB: 1946/11/04  Age: 76 y.o. MRN: [id number]  Chief Complaint  Patient presents with   routine check up  [place] continues to follow with multiple specialist he sees Dr. Einar Gip his cardiologist for his coronary artery disease.  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  And he sees Dr. Nehemiah Massed dermatology for psoriasis.  He is following up with me today for his diabetes and hypertension.  He is accompanied by his son. He has not yet taken his time telmisartan this morning and his blood pressure is excellent at 116/68.  He is concerned about some ongoing weight loss he reports his appetite is just decreased but he does like eating [place] food.  He denies any changes in smell or taste.  He has been adherent with metformin for diabetes.  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   Objective:     BP 116/68 (BP Location: Left Arm, Patient Position: Sitting, Cuff Size: Normal)   Pulse 94   Ht 5' 6" (1.676 m)   Wt 141 lb 12.8 oz (64.3 kg)   SpO2 98%   BMI 22.89 kg/m  BP Readings from Last 3 Encounters:  05/04/22 116/68  03/06/22 120/74  02/10/22 110/60   Wt Readings from Last 3 Encounters:  05/04/22 141 lb 12.8 oz (64.3 kg)  03/06/22 145 lb 3.2 oz (65.9 kg)  02/10/22 145 lb (65.8 kg)      Physical Exam Vitals and nursing note reviewed.  Constitutional:      Appearance: Normal appearance. He is not ill-appearing.  Cardiovascular:     Rate and Rhythm: Normal rate and regular rhythm.  Pulmonary:     Effort: Pulmonary effort is normal.     Breath sounds: No wheezing.     Comments: Fine crackles at bilateral bases Abdominal:     General: Abdomen is flat. There is no distension.     Palpations: Abdomen is soft. There is no mass.  Musculoskeletal:     Right lower leg: Edema (1+) present.     Left lower leg: Edema (1+) present.  Neurological:     Mental Status: He  is alert.  Psychiatric:        Mood and Affect: Mood normal.     Results for orders placed or performed in visit on 05/04/22  CMP14 + Anion Gap  Result Value Ref Range   Glucose 123 (H) 70 - 99 mg/dL   BUN 7 (L) 8 - 27 mg/dL   Creatinine, Ser 0.85 0.76 - 1.27 mg/dL   eGFR 90 >59 mL/min/1.73   BUN/Creatinine Ratio 8 (L) 10 - 24   Sodium 137 134 - 144 mmol/L   Potassium 4.7 3.5 - 5.2 mmol/L   Chloride 97 96 - 106 mmol/L   CO2 23 20 - 29 mmol/L   Anion Gap 17.0 10.0 - 18.0 mmol/L   Calcium 9.3 8.6 - 10.2 mg/dL   Total Protein 6.2 6.0 - 8.5 g/dL   Albumin 3.8 3.7 - 4.7 g/dL   Globulin, Total 2.4 1.5 - 4.5 g/dL   Albumin/Globulin Ratio 1.6 1.2 - 2.2   Bilirubin Total 0.4 0.0 - 1.2 mg/dL   Alkaline Phosphatase 53 44 - 121 IU/L   AST 22 0 - 40 IU/L   ALT 13 0 - 44 IU/L  CBC with Diff  Result Value Ref Range   WBC 8.9 3.4 - 10.8  x10E3/uL   RBC 4.67 4.14 - 5.80 x10E6/uL   Hemoglobin 16.1 13.0 - 17.7 g/dL   Hematocrit 47.0 37.5 - 51.0 %   MCV 101 (H) 79 - 97 fL   MCH 34.5 (H) 26.6 - 33.0 pg   MCHC 34.3 31.5 - 35.7 g/dL   RDW 12.7 11.6 - 15.4 %   Platelets 230 150 - 450 x10E3/uL   Neutrophils 69 Not Estab. %   Lymphs 20 Not Estab. %   Monocytes 6 Not Estab. %   Eos 5 Not Estab. %   Basos 0 Not Estab. %   Neutrophils Absolute 6.1 1.4 - 7.0 x10E3/uL   Lymphocytes Absolute 1.8 0.7 - 3.1 x10E3/uL   Monocytes Absolute 0.5 0.1 - 0.9 x10E3/uL   EOS (ABSOLUTE) 0.5 (H) 0.0 - 0.4 x10E3/uL   Basophils Absolute 0.0 0.0 - 0.2 x10E3/uL   Immature Granulocytes 0 Not Estab. %   Immature Grans (Abs) 0.0 0.0 - 0.1 x10E3/uL  Brain natriuretic peptide  Result Value Ref Range   B Natriuretic Peptide 29.7 0.0 - 100.0 pg/mL  TSH  Result Value Ref Range   TSH 4.110 0.450 - 4.500 uIU/mL  Glucose, capillary  Result Value Ref Range   Glucose-Capillary 142 (H) 70 - 99 mg/dL  POC Hbg A1C  Result Value Ref Range   Hemoglobin A1C 6.2 (A) 4.0 - 5.6 %   HbA1c POC (<> result, manual entry)      HbA1c, POC (prediabetic range)     HbA1c, POC (controlled diabetic range)      Last metabolic panel Lab Results  Component Value Date   GLUCOSE 123 (H) 05/04/2022   NA 137 05/04/2022   K 4.7 05/04/2022   CL 97 05/04/2022   CO2 23 05/04/2022   BUN 7 (L) 05/04/2022   CREATININE 0.85 05/04/2022   EGFR 90 05/04/2022   CALCIUM 9.3 05/04/2022   PROT 6.2 05/04/2022   ALBUMIN 3.8 05/04/2022   LABGLOB 2.4 05/04/2022   AGRATIO 1.6 05/04/2022   BILITOT 0.4 05/04/2022   ALKPHOS 53 05/04/2022   AST 22 05/04/2022   ALT 13 05/04/2022   ANIONGAP 9 09/01/2020   Last lipids Lab Results  Component Value Date   CHOL 108 07/27/2021   HDL 45 07/27/2021   LDLCALC 46 07/27/2021   TRIG 83 07/27/2021   Last hemoglobin A1c Lab Results  Component Value Date   HGBA1C 6.2 (A) 05/04/2022      The ASCVD Risk score (Arnett DK, et al., 2019) failed to calculate for the following reasons:   The valid total cholesterol range is 130 to 320 mg/dL    Assessment & Plan:   Problem List Items Addressed This Visit       Cardiovascular and Mediastinum   Essential hypertension - Primary (Chronic)    Blood pressure is very well controlled I am working to decrease some of his medication burden I will go ahead and discontinue 10 losartan but keep him on metoprolol given his CAD.       Relevant Orders   CMP14 + Anion Gap (Completed)   CBC with Diff (Completed)     Endocrine   Hypothyroidism (Chronic)   Relevant Orders   TSH (Completed)   Controlled type 2 diabetes mellitus (HCC) (Chronic)    A1c remains well controlled at 6.2% he has ongoing weight loss which we do not have another explanation for I am going to go ahead and take him off metformin completely and see how he responds.  Relevant Orders   POC Hbg A1C (Completed)   CMP14 + Anion Gap (Completed)   CBC with Diff (Completed)     Other   Unintentional weight loss    His weight has continued to downtrend, he does have IPF but  seems well controlled.  Appears mostly to be lack of appetite.  We will try to decrease medication burden where possible to see if this will help stimulate appetite.  Blood pressures well controlled so we will discontinue telmisartan A1c is 6.2% we will discontinue metformin and have him follow-up in about 3 months for repeat A1c.       Other Visit Diagnoses     Bilateral lower extremity edema       Relevant Orders   Brain natriuretic peptide (Completed)   Need for prophylactic vaccination against Streptococcus pneumoniae (pneumococcus)       Relevant Orders   Pneumococcal conjugate vaccine 20-valent (Prevnar 20) (Completed)       No follow-ups on file.     C , DO  

## 2022-05-04 NOTE — Patient Instructions (Signed)
Consider changing your diet to a Low FODMAP diet to help decrease bloating.  I am going to have you STOP Telmisartan.  We will likely decrease or stop the metformin as well if you A1c returns at a normal level.

## 2022-05-04 NOTE — Progress Notes (Signed)
[facility] fax request Skyrizi RF. [phone]

## 2022-05-05 DIAGNOSIS — R600 Localized edema: Secondary | ICD-10-CM | POA: Insufficient documentation

## 2022-05-05 DIAGNOSIS — R140 Abdominal distension (gaseous): Secondary | ICD-10-CM | POA: Insufficient documentation

## 2022-05-05 LAB — CMP14 + ANION GAP
ALT: 13 IU/L (ref 0–44)
AST: 22 IU/L (ref 0–40)
Albumin/Globulin Ratio: 1.6 (ref 1.2–2.2)
Albumin: 3.8 g/dL (ref 3.7–4.7)
Alkaline Phosphatase: 53 IU/L (ref 44–121)
Anion Gap: 17 mmol/L (ref 10.0–18.0)
BUN/Creatinine Ratio: 8 — ABNORMAL LOW (ref 10–24)
BUN: 7 mg/dL — ABNORMAL LOW (ref 8–27)
Bilirubin Total: 0.4 mg/dL (ref 0.0–1.2)
CO2: 23 mmol/L (ref 20–29)
Calcium: 9.3 mg/dL (ref 8.6–10.2)
Chloride: 97 mmol/L (ref 96–106)
Creatinine, Ser: 0.85 mg/dL (ref 0.76–1.27)
Globulin, Total: 2.4 g/dL (ref 1.5–4.5)
Glucose: 123 mg/dL — ABNORMAL HIGH (ref 70–99)
Potassium: 4.7 mmol/L (ref 3.5–5.2)
Sodium: 137 mmol/L (ref 134–144)
Total Protein: 6.2 g/dL (ref 6.0–8.5)
eGFR: 90 mL/min/{1.73_m2} (ref >59)

## 2022-05-05 LAB — CBC WITH DIFFERENTIAL/PLATELET
Basophils Absolute: 0 10*3/uL (ref 0.0–0.2)
Basos: 0 %
EOS (ABSOLUTE): 0.5 10*3/uL — ABNORMAL HIGH (ref 0.0–0.4)
Eos: 5 %
Hematocrit: 47 % (ref 37.5–51.0)
Hemoglobin: 16.1 g/dL (ref 13.0–17.7)
Immature Grans (Abs): 0 10*3/uL (ref 0.0–0.1)
Immature Granulocytes: 0 %
Lymphocytes Absolute: 1.8 10*3/uL (ref 0.7–3.1)
Lymphs: 20 %
MCH: 34.5 pg — ABNORMAL HIGH (ref 26.6–33.0)
MCHC: 34.3 g/dL (ref 31.5–35.7)
MCV: 101 fL — ABNORMAL HIGH (ref 79–97)
Monocytes Absolute: 0.5 10*3/uL (ref 0.1–0.9)
Monocytes: 6 %
Neutrophils Absolute: 6.1 10*3/uL (ref 1.4–7.0)
Neutrophils: 69 %
Platelets: 230 10*3/uL (ref 150–450)
RBC: 4.67 x10E6/uL (ref 4.14–5.80)
RDW: 12.7 % (ref 11.6–15.4)
WBC: 8.9 10*3/uL (ref 3.4–10.8)

## 2022-05-05 LAB — TSH: TSH: 4.11 u[IU]/mL (ref 0.450–4.500)

## 2022-05-05 NOTE — Assessment & Plan Note (Signed)
Reports some occasional abdominal bloating and gassiness.  Briefly discussed FODMAP foods and potential to decrease this to decrease abdominal gas however I do not want to restrict his diet too much and he does not seem particularly bothered.

## 2022-05-05 NOTE — Assessment & Plan Note (Signed)
He does have some lower extremity edema present bilaterally.  His albumin is within normal range on CMP today.  I also checked a BNP which is reassuringly below 50 therefore I suspect that this likely some venous insufficiency.

## 2022-05-05 NOTE — Assessment & Plan Note (Signed)
His weight has continued to downtrend, he does have IPF but seems well controlled.  Appears mostly to be lack of appetite.  We will try to decrease medication burden where possible to see if this will help stimulate appetite.  Blood pressures well controlled so we will discontinue telmisartan A1c is 6.2% we will discontinue metformin and have him follow-up in about 3 months for repeat A1c.

## 2022-05-05 NOTE — Assessment & Plan Note (Signed)
A1c remains well controlled at 6.2% he has ongoing weight loss which we do not have another explanation for I am going to go ahead and take him off metformin completely and see how he responds.

## 2022-05-05 NOTE — Assessment & Plan Note (Signed)
Blood pressure is very well controlled I am working to decrease some of his medication burden I will go ahead and discontinue 10 losartan but keep him on metoprolol given his CAD.

## 2022-05-10 ENCOUNTER — Telehealth (INDEPENDENT_AMBULATORY_CARE_PROVIDER_SITE_OTHER): Payer: Medicare HMO | Admitting: Pulmonary Disease

## 2022-05-10 ENCOUNTER — Encounter: Payer: Self-pay | Admitting: Pulmonary Disease

## 2022-05-10 DIAGNOSIS — Z5181 Encounter for therapeutic drug level monitoring: Secondary | ICD-10-CM | POA: Diagnosis not present

## 2022-05-10 DIAGNOSIS — J84112 Idiopathic pulmonary fibrosis: Secondary | ICD-10-CM | POA: Diagnosis not present

## 2022-05-10 NOTE — Progress Notes (Signed)
Nicholas Fleming    [id number]    04-20-1946  Primary Care Physician:Hoffman, Rachel Moulds, DO  Referring Physician: [name], DO [address]  [place],  [place] [address]  Virtual Visit via Video Note  I connected with [name] on 05/10/22 at 11:15 AM EDT by a video enabled telemedicine application and verified that I am speaking with the correct person using two identifiers.  Location: Patient: Home Provider: [address]   I discussed the limitations of evaluation and management by telemedicine and the availability of in person appointments. The patient expressed understanding and agreed to proceed.   Chief complaint: Follow-up for IPF  HPI: 76 year old with history of angina, hypertension, diabetes, hyperlipidemia, psoriasis.  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  Cough is nonproductive in nature associated with mild dyspnea on exertion.  He has history of angina, coronary artery disease with a stent placement many years ago.  He has longstanding psoriasis presenting as rash.  He was on methotrexate for 2 years around 2013 and self discontinued due to concern for side effects.  Recently has been started on [place] with improvement in rash.  He developed COVID-19 infection at the end of December 2020 and treated with monoclonal antibody on 12/29 with improvement in symptoms. Started on Ofev at the end of January 2021.  He is tolerating it well with no GI issues. Evaluated for lung transplant in November 2022 at [facility] and deemed to early for transplant  Evaluated by GI for an unexplained 10 to 20 pound weight loss.  Underwent EGD and colonoscopy in 03/09/20 with findings of benign colon polyps.   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  No mold, hot tub, Jacuzzi, no down pillows or comforter ILD questionnaire 01/12/2020-negative Smoking history: Used to smoke socially.  Quit many years ago Travel history: Originally from [place].  He had lived in [place] for over 30 years and now spends his time between [place] and [place] Relevant family history: No significant family history of lung disease  Interval history: He is doing well with regard to breathing. Followed up with Dr. Einar Gip who noted elevated PA on CT scan however he clinically does not have signs of pulmonary hypertension and pro BNP is negative Continues on ofev without any issue.  Outpatient Encounter Medications as of 05/10/2022  Medication Sig   albuterol (PROVENTIL HFA) 108 (90 Base) MCG/ACT inhaler Inhale 1-2 puffs into the lungs every 6 (six) hours as needed for wheezing or shortness of breath.   aspirin EC 81 MG tablet Take 81 mg by mouth daily.   benzonatate (TESSALON) 200 MG capsule Take 1 capsule (200 mg total) by mouth 2 (two) times daily as needed for cough.   budesonide-formoterol (SYMBICORT) 80-4.5 MCG/ACT inhaler Inhale 2 puffs into the lungs 2 (two) times daily.   guaiFENesin-codeine 100-10 MG/5ML syrup Take 5 mLs by mouth 3 (three) times daily as needed for cough.   ketoconazole (NIZORAL) 2 % cream Apply 1 application. topically daily. Qhs to feet   levothyroxine (SYNTHROID) 50 MCG tablet Take  1 tablet (50 mcg total) by mouth daily.   metoprolol succinate (TOPROL-XL) 50 MG 24 hr tablet Take 1 tablet (50 mg total) by mouth daily. Take with or immediately following a meal.   Nintedanib (OFEV) 150 MG CAPS Take 1 capsule (150 mg total) by mouth 2 (two) times daily.   pantoprazole (PROTONIX) 40 MG tablet Take 1 tablet (40 mg total) by mouth daily.   Risankizumab-rzaa (SKYRIZI) 150 MG/ML SOSY  Inject 150 mg into the skin as directed. Every 12 weeks for maintenance.   rosuvastatin (CRESTOR) 20 MG tablet Take 1 tablet (20 mg total) by mouth daily.   Tavaborole (KERYDIN) 5 % SOLN Apply 1 application. topically at bedtime. Apply to affected toenails nightly   No facility-administered encounter medications on file as of 05/10/2022.   Physical Exam: There were no vitals taken for this visit. Gen:      No acute distress HEENT:  EOMI, sclera anicteric Neck:     No masses; no thyromegaly Lungs:    Bibasal crackles CV:         Regular rate and rhythm; no murmurs Abd:      + bowel sounds; soft, non-tender; no palpable masses, no distension Ext:    No edema; adequate peripheral perfusion Skin:      Warm and dry; no rash Neuro: alert and oriented x 3 Psych: normal mood and affect   Data Reviewed: Imaging: High-res CT 11/19/2019-peripheral and basal subpleural reticulation with groundglass, traction bronchiectasis.  Probable honeycombing.  5 mm lung nodule.  Probable UIP pattern  CT abdomen 03/17/2020-stable pulmonary fibrosis at the base.  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 I have reviewed the images personally  PFTs 03/18/2020 FVC 2.20 [60%], FEV1 1.65 [63%],/75, TLC 3.66 [58%], DLCO 12.0 [53%] Severe restriction, moderate-severe diffusion defect.  07/26/2021 FVC 1.48 [41%], FEV1 1.31 [50%], F/F 88, TLC 2.78 [44%], DLCO 10.37 [46%] Severe restriction, severe diffusion defect.  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  No known exposures or signs and symptoms of connective tissue disease Serologies for CTD are negative Reviewed at multidisciplinary conference on 12/30/19.  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  Continues on Ofev.  He is tolerating it well Recent hepatic panel this month is normal He would like to put off pulmonary rehab as he remains active at home with daily exercise and walking  Cough Continue Protonix for treatment of GERD.  Use Delsym, Tessalon  Plan/Recommendations: - Continue Ofev - Protonix, Tessalon, Delsym  I discussed the assessment and treatment plan with the patient. The patient was provided an opportunity to ask questions and all were answered. The patient agreed with the plan and demonstrated an understanding of the instructions.   The patient was advised to call back or seek an in-person evaluation if the symptoms worsen or if the condition  fails to improve as anticipated.  I provided 35 minutes of non-face-to-face time during this encounter.  Marshell Garfinkel MD [facility] 05/10/2022, 11:22 AM  CC: [name], DO

## 2022-05-12 ENCOUNTER — Other Ambulatory Visit: Payer: Self-pay | Admitting: Pharmacist

## 2022-05-12 DIAGNOSIS — J84112 Idiopathic pulmonary fibrosis: Secondary | ICD-10-CM

## 2022-05-12 MED ORDER — OFEV 150 MG PO CAPS: 1.0000 | ORAL_CAPSULE | Freq: Two times a day (BID) | ORAL | 5 refills | Status: DC

## 2022-05-12 NOTE — Telephone Encounter (Signed)
Refill sent for OFEV to [place] (pulmonary fibrosis team). [phone]  Dose: 150 mg twice daily  Last OV: 05/10/22 (video visit) Provider: Dr. Vaughan Browner  CMET on 05/04/22 wnl  Next OV: 6 months (Not scheduled yet)  Knox Saliva, PharmD, MPH, BCPS Clinical Pharmacist (Rheumatology and Pulmonology)

## 2022-05-16 ENCOUNTER — Other Ambulatory Visit: Payer: Self-pay

## 2022-05-16 MED ORDER — METOPROLOL SUCCINATE ER 50 MG PO TB24: 50.0000 mg | ORAL_TABLET | Freq: Every day | ORAL | 3 refills | Status: DC

## 2022-05-16 MED ORDER — ALBUTEROL SULFATE HFA 108 (90 BASE) MCG/ACT IN AERS: 1.0000 | INHALATION_SPRAY | Freq: Four times a day (QID) | RESPIRATORY_TRACT | 3 refills | Status: AC | PRN

## 2022-05-18 ENCOUNTER — Other Ambulatory Visit: Payer: Self-pay | Admitting: *Deleted

## 2022-05-18 MED ORDER — LEVOTHYROXINE SODIUM 50 MCG PO TABS: 50.0000 ug | ORAL_TABLET | Freq: Every day | ORAL | 3 refills | Status: DC

## 2022-06-08 ENCOUNTER — Other Ambulatory Visit: Payer: Self-pay | Admitting: Pulmonary Disease

## 2022-06-12 ENCOUNTER — Other Ambulatory Visit: Payer: Self-pay | Admitting: Pharmacist

## 2022-06-12 DIAGNOSIS — J84112 Idiopathic pulmonary fibrosis: Secondary | ICD-10-CM

## 2022-06-12 MED ORDER — OFEV 150 MG PO CAPS: 1.0000 | ORAL_CAPSULE | Freq: Two times a day (BID) | ORAL | 1 refills | Status: DC

## 2022-06-22 ENCOUNTER — Other Ambulatory Visit: Payer: Self-pay | Admitting: Internal Medicine

## 2022-06-22 MED ORDER — BUDESONIDE-FORMOTEROL FUMARATE 80-4.5 MCG/ACT IN AERO: 2.0000 | INHALATION_SPRAY | Freq: Two times a day (BID) | RESPIRATORY_TRACT | 3 refills | Status: DC

## 2022-08-31 ENCOUNTER — Other Ambulatory Visit: Payer: Medicare HMO

## 2022-09-06 ENCOUNTER — Ambulatory Visit: Payer: Medicare HMO | Admitting: Cardiology

## 2022-09-08 ENCOUNTER — Ambulatory Visit: Payer: Medicare HMO | Admitting: Cardiology

## 2022-09-13 ENCOUNTER — Observation Stay
Admission: EM | Admit: 2022-09-13 | Discharge: 2022-09-14 | Disposition: A | Discharge status: Home / Self Care | Payer: Medicare HMO | Attending: Hospitalist | Admitting: Hospitalist

## 2022-09-13 ENCOUNTER — Emergency Department: Payer: Medicare HMO

## 2022-09-13 ENCOUNTER — Ambulatory Visit: Payer: Self-pay | Admitting: Licensed Clinical Social Worker

## 2022-09-13 ENCOUNTER — Other Ambulatory Visit: Payer: Self-pay

## 2022-09-13 ENCOUNTER — Observation Stay: Payer: Medicare HMO

## 2022-09-13 DIAGNOSIS — Z7982 Long term (current) use of aspirin: Secondary | ICD-10-CM | POA: Diagnosis not present

## 2022-09-13 DIAGNOSIS — Z955 Presence of coronary angioplasty implant and graft: Secondary | ICD-10-CM | POA: Diagnosis not present

## 2022-09-13 DIAGNOSIS — Z87891 Personal history of nicotine dependence: Secondary | ICD-10-CM | POA: Insufficient documentation

## 2022-09-13 DIAGNOSIS — Z79899 Other long term (current) drug therapy: Secondary | ICD-10-CM | POA: Diagnosis not present

## 2022-09-13 DIAGNOSIS — J84112 Idiopathic pulmonary fibrosis: Secondary | ICD-10-CM | POA: Diagnosis not present

## 2022-09-13 DIAGNOSIS — Z7951 Long term (current) use of inhaled steroids: Secondary | ICD-10-CM | POA: Diagnosis not present

## 2022-09-13 DIAGNOSIS — I2510 Atherosclerotic heart disease of native coronary artery without angina pectoris: Secondary | ICD-10-CM | POA: Insufficient documentation

## 2022-09-13 DIAGNOSIS — I639 Cerebral infarction, unspecified: Secondary | ICD-10-CM

## 2022-09-13 DIAGNOSIS — I10 Essential (primary) hypertension: Secondary | ICD-10-CM | POA: Insufficient documentation

## 2022-09-13 DIAGNOSIS — G459 Transient cerebral ischemic attack, unspecified: Secondary | ICD-10-CM | POA: Diagnosis not present

## 2022-09-13 DIAGNOSIS — J45909 Unspecified asthma, uncomplicated: Secondary | ICD-10-CM | POA: Insufficient documentation

## 2022-09-13 DIAGNOSIS — R42 Dizziness and giddiness: Secondary | ICD-10-CM | POA: Diagnosis not present

## 2022-09-13 DIAGNOSIS — E119 Type 2 diabetes mellitus without complications: Secondary | ICD-10-CM | POA: Insufficient documentation

## 2022-09-13 DIAGNOSIS — I63211 Cerebral infarction due to unspecified occlusion or stenosis of right vertebral arteries: Secondary | ICD-10-CM | POA: Diagnosis not present

## 2022-09-13 DIAGNOSIS — E039 Hypothyroidism, unspecified: Secondary | ICD-10-CM | POA: Diagnosis not present

## 2022-09-13 DIAGNOSIS — D72829 Elevated white blood cell count, unspecified: Secondary | ICD-10-CM | POA: Insufficient documentation

## 2022-09-13 DIAGNOSIS — Z8673 Personal history of transient ischemic attack (TIA), and cerebral infarction without residual deficits: Secondary | ICD-10-CM | POA: Diagnosis present

## 2022-09-13 DIAGNOSIS — R2681 Unsteadiness on feet: Secondary | ICD-10-CM | POA: Diagnosis present

## 2022-09-13 DIAGNOSIS — R29818 Other symptoms and signs involving the nervous system: Secondary | ICD-10-CM | POA: Diagnosis not present

## 2022-09-13 LAB — ETHANOL: Alcohol, Ethyl (B): <10 mg/dL (ref <10)

## 2022-09-13 LAB — URINALYSIS, COMPLETE (UACMP) WITH MICROSCOPIC
Bilirubin Urine: NEGATIVE
Glucose, UA: NEGATIVE mg/dL
Hgb urine dipstick: NEGATIVE
Ketones, ur: NEGATIVE mg/dL
Leukocytes,Ua: NEGATIVE
Nitrite: NEGATIVE
Protein, ur: NEGATIVE mg/dL
Specific Gravity, Urine: 1.011 (ref 1.005–1.030)
pH: 7 (ref 5.0–8.0)

## 2022-09-13 LAB — COMPREHENSIVE METABOLIC PANEL
ALT: 20 U/L (ref 0–44)
AST: 32 U/L (ref 15–41)
Albumin: 3.9 g/dL (ref 3.5–5.0)
Alkaline Phosphatase: 44 U/L (ref 38–126)
Anion gap: 8 (ref 5–15)
BUN: 11 mg/dL (ref 8–23)
CO2: 26 mmol/L (ref 22–32)
Calcium: 8.6 mg/dL — ABNORMAL LOW (ref 8.9–10.3)
Chloride: 103 mmol/L (ref 98–111)
Creatinine, Ser: 0.96 mg/dL (ref 0.61–1.24)
GFR, Estimated: >60 mL/min (ref >60)
Glucose, Bld: 124 mg/dL — ABNORMAL HIGH (ref 70–99)
Potassium: 4 mmol/L (ref 3.5–5.1)
Sodium: 137 mmol/L (ref 135–145)
Total Bilirubin: 0.9 mg/dL (ref 0.3–1.2)
Total Protein: 7.5 g/dL (ref 6.5–8.1)

## 2022-09-13 LAB — CBC
HCT: 49.4 % (ref 39.0–52.0)
Hemoglobin: 15.9 g/dL (ref 13.0–17.0)
MCH: 31.8 pg (ref 26.0–34.0)
MCHC: 32.2 g/dL (ref 30.0–36.0)
MCV: 98.8 fL (ref 80.0–100.0)
Platelets: 214 10*3/uL (ref 150–400)
RBC: 5 MIL/uL (ref 4.22–5.81)
RDW: 13.5 % (ref 11.5–15.5)
WBC: 14.1 10*3/uL — ABNORMAL HIGH (ref 4.0–10.5)
nRBC: 0 % (ref 0.0–0.2)

## 2022-09-13 LAB — DIFFERENTIAL
Abs Immature Granulocytes: 0.04 10*3/uL (ref 0.00–0.07)
Basophils Absolute: 0.1 10*3/uL (ref 0.0–0.1)
Basophils Relative: 1 %
Eosinophils Absolute: 0.5 10*3/uL (ref 0.0–0.5)
Eosinophils Relative: 4 %
Immature Granulocytes: 0 %
Lymphocytes Relative: 18 %
Lymphs Abs: 2.5 10*3/uL (ref 0.7–4.0)
Monocytes Absolute: 0.9 10*3/uL (ref 0.1–1.0)
Monocytes Relative: 6 %
Neutro Abs: 10.1 10*3/uL — ABNORMAL HIGH (ref 1.7–7.7)
Neutrophils Relative %: 71 %

## 2022-09-13 LAB — CBG MONITORING, ED: Glucose-Capillary: 132 mg/dL — ABNORMAL HIGH (ref 70–99)

## 2022-09-13 LAB — BRAIN NATRIURETIC PEPTIDE: B Natriuretic Peptide: 82.7 pg/mL (ref 0.0–100.0)

## 2022-09-13 LAB — PROTIME-INR
INR: 1.1 (ref 0.8–1.2)
Prothrombin Time: 13.6 seconds (ref 11.4–15.2)

## 2022-09-13 LAB — SEDIMENTATION RATE: Sed Rate: 7 mm/hr (ref 0–20)

## 2022-09-13 LAB — APTT: aPTT: 32 seconds (ref 24–36)

## 2022-09-13 MED ORDER — ROSUVASTATIN CALCIUM 20 MG PO TABS
40.0000 mg | ORAL_TABLET | Freq: Every day | ORAL | Status: DC
  Administered 2022-09-14: 40 mg via ORAL
  Filled 2022-09-13: qty 2

## 2022-09-13 MED ORDER — ACETAMINOPHEN 650 MG RE SUPP: 650.0000 mg | RECTAL | Status: DC | PRN

## 2022-09-13 MED ORDER — HEPARIN SODIUM (PORCINE) 5000 UNIT/ML IJ SOLN
5000.0000 [IU] | Freq: Three times a day (TID) | INTRAMUSCULAR | Status: DC
  Administered 2022-09-14: 5000 [IU] via SUBCUTANEOUS
  Filled 2022-09-13: qty 1

## 2022-09-13 MED ORDER — ASPIRIN 81 MG PO TBEC: 81.0000 mg | DELAYED_RELEASE_TABLET | Freq: Every day | ORAL | Status: DC

## 2022-09-13 MED ORDER — SENNOSIDES-DOCUSATE SODIUM 8.6-50 MG PO TABS: 1.0000 | ORAL_TABLET | Freq: Every evening | ORAL | Status: DC | PRN

## 2022-09-13 MED ORDER — ACETAMINOPHEN 325 MG PO TABS: 650.0000 mg | ORAL_TABLET | ORAL | Status: DC | PRN

## 2022-09-13 MED ORDER — NINTEDANIB ESYLATE 150 MG PO CAPS
1.0000 | ORAL_CAPSULE | Freq: Two times a day (BID) | ORAL | Status: DC
  Filled 2022-09-13: qty 1

## 2022-09-13 MED ORDER — ASPIRIN 81 MG PO CHEW
81.0000 mg | CHEWABLE_TABLET | Freq: Every day | ORAL | Status: DC
  Administered 2022-09-14: 81 mg via ORAL
  Filled 2022-09-13: qty 1

## 2022-09-13 MED ORDER — STROKE: EARLY STAGES OF RECOVERY BOOK: Freq: Once | Status: DC

## 2022-09-13 MED ORDER — VITAMIN D 25 MCG (1000 UNIT) PO TABS
1000.0000 [IU] | ORAL_TABLET | Freq: Every day | ORAL | Status: DC
  Administered 2022-09-13 – 2022-09-14 (×2): 1000 [IU] via ORAL
  Filled 2022-09-13 (×2): qty 1

## 2022-09-13 MED ORDER — SODIUM CHLORIDE 0.9 % IV SOLN: INTRAVENOUS | Status: DC

## 2022-09-13 MED ORDER — CLOPIDOGREL BISULFATE 75 MG PO TABS
75.0000 mg | ORAL_TABLET | Freq: Every day | ORAL | Status: DC
  Administered 2022-09-13 – 2022-09-14 (×2): 75 mg via ORAL
  Filled 2022-09-13 (×2): qty 1

## 2022-09-13 MED ORDER — ROSUVASTATIN CALCIUM 20 MG PO TABS
20.0000 mg | ORAL_TABLET | Freq: Every day | ORAL | Status: DC
  Filled 2022-09-13: qty 1

## 2022-09-13 MED ORDER — MOMETASONE FURO-FORMOTEROL FUM 100-5 MCG/ACT IN AERO
2.0000 | INHALATION_SPRAY | Freq: Two times a day (BID) | RESPIRATORY_TRACT | Status: DC
  Filled 2022-09-13: qty 8.8

## 2022-09-13 MED ORDER — CLOPIDOGREL BISULFATE 75 MG PO TABS: 75.0000 mg | ORAL_TABLET | Freq: Every day | ORAL | 11 refills | Status: DC

## 2022-09-13 MED ORDER — ASPIRIN 81 MG PO CHEW
324.0000 mg | CHEWABLE_TABLET | Freq: Once | ORAL | Status: AC
  Administered 2022-09-13: 324 mg via ORAL
  Filled 2022-09-13: qty 4

## 2022-09-13 MED ORDER — SODIUM CHLORIDE 0.9% FLUSH
3.0000 mL | Freq: Once | INTRAVENOUS | Status: AC
  Administered 2022-09-13: 3 mL via INTRAVENOUS

## 2022-09-13 MED ORDER — LEVOTHYROXINE SODIUM 50 MCG PO TABS
50.0000 ug | ORAL_TABLET | Freq: Every day | ORAL | Status: DC
  Administered 2022-09-14: 50 ug via ORAL
  Filled 2022-09-13: qty 1

## 2022-09-13 MED ORDER — PANTOPRAZOLE SODIUM 40 MG PO TBEC
40.0000 mg | DELAYED_RELEASE_TABLET | Freq: Every day | ORAL | Status: DC
  Administered 2022-09-14: 40 mg via ORAL
  Filled 2022-09-13: qty 1

## 2022-09-13 MED ORDER — ACETAMINOPHEN 160 MG/5ML PO SOLN: 650.0000 mg | ORAL | Status: DC | PRN

## 2022-09-13 MED ORDER — ALBUTEROL SULFATE HFA 108 (90 BASE) MCG/ACT IN AERS: 1.0000 | INHALATION_SPRAY | Freq: Four times a day (QID) | RESPIRATORY_TRACT | Status: DC | PRN

## 2022-09-13 MED ORDER — IOHEXOL 350 MG/ML SOLN
75.0000 mL | Freq: Once | INTRAVENOUS | Status: AC | PRN
  Administered 2022-09-13: 75 mL via INTRAVENOUS

## 2022-09-13 NOTE — H&P (Addendum)
History and Physical    Nicholas Fleming YCX:[id number] DOB: 1946/06/17 DOA: 09/13/2022  PCP: [name], DO  Patient coming from: home  I have personally briefly reviewed patient's old medical records in [place]  Chief Complaint: vomiting, dizziness and ataxia  HPI: Nicholas Fleming is a 76 y.o. 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 Patient noted no associated HA/focal extremity weakness, paresthesias, slurred speech, vision changes, sob/ chest pain or palpitations. He noted similar episode one year ago but notes this resolved on its own within 30 minutes. He notes he is back to his baseline and has no complaints currently. ON further ros he denies fever/chills/ new cough / sore throat/diarrhea or abdominal pain . He notes chronic cough that he states is due to GERD this has not changed.  He also notes  sick contact in grandson.   ED Course:  Temp 97.5, bp 155/77, hr 69, rr 20  Wbc:14.1, hgg 15.9, plt 214 , increase pmn 10.1 Na 137, K 4, glu 124, cr 0.96  Etoh< 10 Inr 1.1 CTH IMPRESSION: 1. No evidence of acute intracranial abnormality. 2. ASPECTS is 10.    CTA IMPRESSION: No emergent large vessel occlusion or proximal hemodynamically significant stenosis.  EKG: PVC ,LVH   Tx asa  Review of Systems: As per HPI otherwise 10 point review of systems negative.   Past Medical History:  Diagnosis Date   Allergy    Asthma    Bronchitis    Controlled type 2 diabetes mellitus ([place]) 10/09/2019   Essential hypertension 10/09/2019   Hypothyroidism 10/09/2019   Psoriasis     Past Surgical History:  Procedure Laterality Date   COLONOSCOPY WITH PROPOFOL N/A 03/09/2020   Procedure: COLONOSCOPY WITH PROPOFOL;  Surgeon:  [name], MD;  Location: [facility] ENDOSCOPY;  Service: Gastroenterology;  Laterality: N/A;   CORONARY STENT PLACEMENT Right    ESOPHAGOGASTRODUODENOSCOPY (EGD) WITH PROPOFOL N/A 03/09/2020   Procedure: ESOPHAGOGASTRODUODENOSCOPY (EGD) WITH PROPOFOL;  Surgeon: [name], MD;  Location[facility] ENDOSCOPY;  Service: Gastroenterology;  Laterality: N/A;  Per Dr. Vicente Males, schedule at 7:30 am   [place]  ~2010     reports that he quit smoking about 59 years ago. His smoking use included cigarettes. He has a 0.25 pack-year smoking history. He has never used smokeless tobacco. He reports current alcohol use. He reports that he does not use drugs.  No Known Allergies  Family History  Problem Relation Age of Onset   Hypertension Mother    CAD Brother    Psoriasis Brother     Prior to Admission medications   Medication Sig Start Date End Date Taking? Authorizing Provider  albuterol (PROVENTIL HFA) 108 (90 Base) MCG/ACT inhaler Inhale 1-2 puffs into the lungs every 6 (six) hours as needed for wheezing or shortness of breath. 05/16/22  Yes [name], DO  aspirin EC 81 MG tablet Take 81 mg by mouth daily.   Yes [provider]  budesonide-formoterol (SYMBICORT) 80-4.5 MCG/ACT inhaler Inhale 2 puffs into the lungs 2 (two) times daily. 06/22/22  Yes [name], DO  cholecalciferol (VITAMIN D3) 25 MCG (1000 UNIT) tablet Take 1,000 Units by mouth daily.   Yes [provider]  clopidogrel (PLAVIX) 75 MG tablet  Take 1 tablet (75 mg total) by mouth daily. 09/13/22 09/13/23 Yes Nena Polio, MD  levothyroxine (SYNTHROID) 50 MCG tablet Take 1 tablet (50 mcg total) by mouth daily. 05/18/22 05/18/23 Yes [name], DO  metoprolol succinate (TOPROL-XL) 50 MG 24 hr tablet Take 1 tablet (50 mg total) by mouth daily. Take with or immediately following a meal. 05/16/22  Yes Hoffman, Erik C, DO  Nintedanib (OFEV) 150 MG CAPS Take 1 capsule (150 mg total) by mouth 2 (two)  times daily. 06/12/22  Yes Mannam, Praveen, MD  pantoprazole (PROTONIX) 40 MG tablet TAKE ONE TABLET BY MOUTH DAILY 06/08/22  Yes Mannam, Praveen, MD  Risankizumab-rzaa (SKYRIZI) 150 MG/ML SOSY Inject 150 mg into the skin as directed. Every 12 weeks for maintenance. 05/04/22  Yes Ralene Bathe, MD  rosuvastatin (CRESTOR) 20 MG tablet Take 1 tablet (20 mg total) by mouth daily. 04/24/22  Yes [name], DO  ketoconazole (NIZORAL) 2 % cream Apply 1 application. topically daily. Qhs to feet 03/23/22   Ralene Bathe, MD  Tavaborole [facility]) 5 % SOLN Apply 1 application. topically at bedtime. Apply to affected toenails nightly Patient not taking: Reported on 09/13/2022 03/23/22   Ralene Bathe, MD    Physical Exam: Vitals:   09/13/22 1626 09/13/22 1700 09/13/22 1730 09/13/22 1800  BP: (!) 155/77 138/77 131/82 127/71  Pulse: 69 71 68 63  Resp: 20 (!) '27 18 20  '$ Temp: (!) 97.5 F (36.4 C)     TempSrc: Oral     SpO2: 97% 98% 97% 97%     Vitals:   09/13/22 1626 09/13/22 1700 09/13/22 1730 09/13/22 1800  BP: (!) 155/77 138/77 131/82 127/71  Pulse: 69 71 68 63  Resp: 20 (!) '27 18 20  '$ Temp: (!) 97.5 F (36.4 C)     TempSrc: Oral     SpO2: 97% 98% 97% 97%  Constitutional: NAD, calm, comfortable Eyes: PERRL, lids and conjunctivae normal ENMT: Mucous membranes are moist. Posterior pharynx clear of any exudate or lesions.Normal dentition.  Neck: normal, supple, no masses, no thyromegaly Respiratory: clear to auscultation bilaterally, no wheezing, no crackles. Normal respiratory effort. No accessory muscle use.  Cardiovascular: Regular rate and rhythm, no murmurs / rubs / gallops. No extremity edema. 2+ pedal pulses. Abdomen: no tenderness, no masses palpated. No hepatosplenomegaly. Bowel sounds positive.  Musculoskeletal: no clubbing / cyanosis. No joint deformity upper and lower extremities. Good ROM, no contractures. Normal muscle tone.  Skin: no rashes, lesions, ulcers. No  induration Neurologic: CN 2-12 grossly intact. Sensation intact,  Strength 5/5 in all 4.  Psychiatric: Normal judgment and insight. Alert and oriented x 3. Normal mood.    Labs on Admission: I have personally reviewed following labs and imaging studies  CBC: Recent Labs  Lab 09/13/22 1627  WBC 14.1*  NEUTROABS 10.1*  HGB 15.9  HCT 49.4  MCV 98.8  PLT 557   Basic Metabolic Panel: Recent Labs  Lab 09/13/22 1627  NA 137  K 4.0  CL 103  CO2 26  GLUCOSE 124*  BUN 11  CREATININE 0.96  CALCIUM 8.6*   GFR: CrCl cannot be calculated (Unknown ideal weight.). Liver Function Tests: Recent Labs  Lab 09/13/22 1627  AST 32  ALT 20  ALKPHOS 44  BILITOT 0.9  PROT 7.5  ALBUMIN 3.9   No results for input(s): "LIPASE", "AMYLASE" in the last 168 hours. No results for input(s): "AMMONIA" in the last 168 hours. Coagulation Profile: Recent Labs  Lab 09/13/22 1627  INR 1.1   Cardiac Enzymes: No results for input(s): "CKTOTAL", "CKMB", "CKMBINDEX", "TROPONINI" in the last 168 hours. BNP (last 3 results) No results for input(s): "PROBNP" in the last 8760 hours. HbA1C: No results for input(s): "HGBA1C" in the last 72 hours. CBG: Recent Labs  Lab 09/13/22 1624  GLUCAP 132*   Lipid Profile: No results for input(s): "CHOL", "HDL", "LDLCALC", "TRIG", "CHOLHDL", "LDLDIRECT" in the last 72 hours. Thyroid Function Tests: No results for input(s): "TSH", "T4TOTAL", "FREET4", "T3FREE", "THYROIDAB" in the last 72 hours. Anemia Panel: No results for input(s): "VITAMINB12", "FOLATE", "FERRITIN", "TIBC", "IRON", "RETICCTPCT" in the last 72 hours. Urine analysis:    Component Value Date/Time   APPEARANCEUR Clear 10/09/2019 1220   GLUCOSEU Negative 10/09/2019 1220   BILIRUBINUR Negative 10/09/2019 1220   PROTEINUR Negative 10/09/2019 1220   NITRITE Negative 10/09/2019 1220   LEUKOCYTESUR Negative 10/09/2019 1220    Radiological Exams on Admission: CT ANGIO HEAD NECK W WO CM  (CODE STROKE)  Result Date: 09/13/2022 CLINICAL DATA:  Stroke, follow up EXAM: CT ANGIOGRAPHY HEAD AND NECK TECHNIQUE: Multidetector CT imaging of the head and neck was performed using the standard protocol during bolus administration of intravenous contrast. Multiplanar CT image reconstructions and MIPs were obtained to evaluate the vascular anatomy. Carotid stenosis measurements (when applicable) are obtained utilizing NASCET criteria, using the distal internal carotid diameter as the denominator. RADIATION DOSE REDUCTION: This exam was performed according to the departmental dose-optimization program which includes automated exposure control, adjustment of the mA and/or kV according to patient size and/or use of iterative reconstruction technique. CONTRAST:  29m OMNIPAQUE IOHEXOL 350 MG/ML SOLN COMPARISON:  None Available. FINDINGS: CTA NECK FINDINGS Aortic arch: Great vessel origins are patent without significant stenosis. Right carotid system: No evidence of dissection, stenosis (50% or greater), or occlusion. Tortuous ICA at the skull base. Left carotid system: No evidence of dissection, stenosis (50% or greater), or occlusion. Tortuous ICA at the skull base. Vertebral arteries: Severe right vertebral origin stenosis due to atherosclerosis. Otherwise, vertebral arteries are patent without significant stenosis stroke. Right dominant. Skeleton: Severe multilevel degenerative change in the cervical spine. Other neck: No acute findings. Upper chest: Fibrotic changes in the visualized lung apices. Please see prior high-resolution CT of the chest for further characterization. Review of the MIP images confirms the above findings CTA HEAD FINDINGS Anterior circulation: Bilateral intracranial ICAs MCAs, and ACAs are patent without proximal hemodynamically significant stenosis. Posterior circulation: Bilateral intradural vertebral arteries basilar artery and bilateral posterior cerebral arteries are patent without  proximal hemodynamically significant stenosis. Left fetal type PCA, anatomic variant. Venous sinuses: As permitted by contrast timing, patent. Anatomic variants: Detailed above. Review of the MIP images confirms the above findings IMPRESSION: No emergent large vessel occlusion or proximal hemodynamically significant stenosis. Electronically Signed   By: FMargaretha SheffieldM.D.   On: 09/13/2022 16:58   CT HEAD CODE STROKE WO CONTRAST  Result Date: 09/13/2022 CLINICAL DATA:  Code stroke.  Neuro deficit, acute, stroke suspected EXAM: CT HEAD WITHOUT CONTRAST TECHNIQUE: Contiguous axial images were obtained from the base of the skull through the vertex without intravenous contrast. RADIATION DOSE REDUCTION: This exam was performed according to the departmental dose-optimization program which includes automated exposure control, adjustment of the mA and/or kV according to patient size and/or use of iterative reconstruction technique. COMPARISON:  None Available. FINDINGS: Brain: No evidence of acute large vascular territory infarction, hemorrhage, hydrocephalus, extra-axial collection or mass lesion/mass effect. Partially empty sella. Vascular:  No hyperdense vessel identified. Skull: No acute fracture. Sinuses/Orbits: No acute findings. ASPECTS [facility] Stroke Program Early CT Score) total score (0-10 with 10 being normal): 10. IMPRESSION: 1. No evidence of acute intracranial abnormality. 2. ASPECTS is 10. Code stroke imaging results were communicated on 09/13/2022 at 4:45 pm to provider Dr. Quentin Cornwall via telephone, who verbally acknowledged these results. Electronically Signed   By: Margaretha Sheffield M.D.   On: 09/13/2022 16:46    EKG: Independently reviewed. See above  Assessment/Plan TIA r/o CVA -ataxia -admit NIHSS  of 0 -CTH, CTA head and neck unremarkable  -symptoms now completely resolved - case discussed with neurology  -admit tia/cva r/o protocol -MRI brain pending  -A1c/HLD pending -Permissive  HTN x48 hours from sx onset or until stroke ruled out by MRI -goal BP < 220/110, PRN above these parameters - per neuro ASA '81mg'$  daily + plavix '75mg'$  daily x21 days f/b ASA '81mg'$  daily monotherapy after that -neuro checks , SLP, PT/OT  -await final neuro recs    Leukocytosis with increase PMN - no fever/ no chills  -has chronic cough  -cxr , ua , full respiratory panel, inflammatory markers r/o infection  -trend wbc  -no need for abx at this time , low threshold to start if patient spikes fever  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 Presyncope related - check ce/bnp to be complete  HTN -hold metoprolol for now / permissive htn  Hypothyrodism -resume synthroid  Psoriasis -resume home regimen as able   GERD -ppi   DVT prophylaxis: heparin  Code Status: full Family Communication: none at beside currently  Disposition Plan: patient  expected to be admitted less than 2 midnights  Consults called: Neurology Sethi Admission status: observation    Clance Boll MD [facility]   If 7PM-7AM, please contact night-coverage [email or web address] Password [facility]  09/13/2022, 6:59 PM

## 2022-09-13 NOTE — ED Notes (Signed)
Pt is coming in with son who said last known well of his father was 61. Pt was leaning to the right and unsteady on feet. Pt became nauseous and vomited twice after becoming unsteady. Pt is A&Ox4. Pt is ambulatory at home, but was unsteady on feet in CT. Pt's son is bedside.

## 2022-09-13 NOTE — Patient Outreach (Signed)
  Care Coordination   Initial Visit Note   09/13/2022 Name: Thaer Miyoshi MRN: [id number] DOB: [birth year]  Courtez Twaddle is a 76 y.o. year old male who sees [name], DO for primary care. I spoke with  [name] by phone today.  What matters to the patients health and wellness today?  SW services not needed at this time.    Goals Addressed               This Visit's Progress     Care Coordination Activiites - Initial visit (pt-stated)        Patient needs are being met.  No additional concerns or needs. No barriers or unmet needs.  No interventions required or requested.  Patient is doing well and SW services are no needed a this time.         SDOH assessments and interventions completed:  Yes     Care Coordination Interventions Activated:  Yes  Care Coordination Interventions:  Yes, provided   Follow up plan: No further intervention required.   Encounter Outcome:  Pt. Visit Completed   Lenor Derrick , MSW Social Worker [facility]  [phone]

## 2022-09-13 NOTE — ED Notes (Signed)
Pt transported to CT 1 at this time.

## 2022-09-13 NOTE — Consult Note (Signed)
Code stroke activated at 1633 while in CT. EDP with pt at this time.   Paged neuro at 1635. Dr Leonie Man on camera at 1636 for neuro exam.   Left CT at 1654.  Off camera at 1712.  Biagio Borg, Telestroke RN

## 2022-09-13 NOTE — Consult Note (Signed)
[facility] Telemedicine Consult   Requesting Provider: Willeen Fleming Consult Participants: Patient, his son Dr. Dareen Piano, bedside RN, Dr. Rip Harbour and [facility] RN Location of the provider: [facility] Location of the patient: [facility]  This consult was provided via telemedicine with 2-way video and audio communication. The patient/family was informed that care would be provided in this way and agreed to receive care in this manner.    Chief Complaint: Dizziness and right-sided leaning  HPI: 76 year old with history of angina, hypertension, diabetes, hyperlipidemia, psoriasis.  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  He developed sudden onset of vomiting x2 as well as dizziness and imbalance and falling to the right side.  He denied any headache, slurred speech, double vision, vertigo, extremity weakness or numbness.  On arrival he was found to be unsteady while trying to get out of the chair and to the hospital bed gradually during the exam his symptoms resolved and he stated he was almost back to baseline.  He denies any prior history of strokes TIA migraines or significant neurological problems.    LKW: 0272 tpa given?: No, resolving deficits IR Thrombectomy? No, clinical exam and CT angiogram not suggestive Modified Rankin Scale: 0-Completely asymptomatic and back to baseline post- stroke Time of teleneurologist evaluation: 5366  Exam: Vitals:   09/13/22 1626  BP: (!) 155/77  Pulse: 69  Resp: 20  Temp: (!) 97.5 F (36.4 C)  SpO2: 97%    General: Pleasant elderly Nicholas Fleming origin male not in distress.  1A: Level of Consciousness - 0 1B: Ask Month and Age - 0 1C: 'Blink Eyes' & 'Squeeze Hands' - 0 2: Test Horizontal Extraocular Movements - 0 3: Test Visual Fields - 0 4: Test Facial Palsy - 0 5A: Test Left Arm Motor Drift - 0 5B: Test Right Arm Motor Drift - 0 6A: Test Left Leg Motor Drift - 0 6B: Test Right Leg Motor  Drift - 0 7: Test Limb Ataxia - 0 8: Test Sensation - 0 9: Test Language/Aphasia- 0 10: Test Dysarthria - 0 11: Test Extinction/Inattention - 0 NIHSS score: 0 Patient is pleasant awake alert and cooperative.  Extraocular movements are full range without nystagmus.  No dysarthria or aphasia Face is symmetric without weakness.  Tongue midline.  Motor system exam shows symmetric upper and lower extremity strength without focal weakness.  Finger-to-nose and needle coordination is accurate.  Touch pinprick sensation is symmetric bilaterally.  Patient is able to sit up and stand and walks with a broad-based slightly cautious gait but does not lean to the right.  Imaging Reviewed:  CT head without contrast personally reviewed shows no acute abnormality.  Mild age-related changes of small vessel disease.  Aspect score of 10. CTangiogram of brain and neck emergently reviewed shows no large vessel stenosis or occlusion. Labs reviewed in epic and pertinent values follow: CMP is unremarkable except calcium of 8.6 and blood glucose of 124. CT this is significant only for mild elevation of white count 14.1. PT and INR are normal Assessment:  76 year old [place] origin male with sudden onset of dizziness, vomiting and leaning to the right likely due to posterior circulation TIA versus small infarct. Patient is not a candidate for thrombolysis due to resolved symptoms and deficits. Patient is not a candidate for thrombectomy as no LVO noted on CT angio Vascular risk factors of hypertension, diabetes, coronary artery disease Recommendations:  Admit to medical team for close observation and  further work-up. Check MRI scan of the brain without contrast, echocardiogram, lipid profile and hemoglobin A1c. Telemetry monitoring Mobilize out of bed.  Physical Occupational Therapy consults. Aspirin and Plavix for 3 weeks followed by Plavix alone for secondary stroke prevention.   Long discussion with  patient and his son and Dr. Rip Harbour and answered questions.   This patient is receiving care for possible acute neurological changes. There was 55 minutes of care by this provider at the time of service, including time for direct evaluation via telemedicine, review of medical records, imaging studies and discussion of findings with providers, the patient and/or family.  Nicholas Contras MD [facility] [phone]  If 7pm- 7am, please page neurology on call as listed in [employer].

## 2022-09-13 NOTE — ED Notes (Signed)
Pt's son has stepped away from bedside. MRI contacted and was told they were finishing up with outpatient and would be coming for pt in 30 minutes. Pt provided with water

## 2022-09-13 NOTE — ED Notes (Signed)
Dr. Cinda Quest EDP at bedside to see patient at this time.

## 2022-09-13 NOTE — ED Provider Notes (Signed)
[facility] Provider Note    Event Date/Time   First MD Initiated Contact with Patient 09/13/22 1655     (approximate)   History   Code Stroke   HPI  Nicholas Fleming is a 76 y.o. male who to 25 this afternoon was noted to be falling to the right side.  He did not appear to have any other deficits.  He was brought here CT was done which was negative CT angio still being reconstructed but preliminarily looks negative.  Patient was very unsteady getting onto the CT table but afterwards had improved and was able to walk without falling.  He was still somewhat unsteady though.  Neurology has seen and does not think he is a tPA candidate.      Physical Exam   Triage Vital Signs: ED Triage Vitals [09/13/22 1626]  Enc Vitals Group     BP (!) 155/77     Pulse Rate 69     Resp 20     Temp (!) 97.5 F (36.4 C)     Temp Source Oral     SpO2 97 %     Weight      Height      Head Circumference      Peak Flow      Pain Score      Pain Loc      Pain Edu?      Excl. in [place]?     Most recent vital signs: Vitals:   09/13/22 1700 09/13/22 1730  BP: 138/77 131/82  Pulse: 71 68  Resp: (!) 27 18  Temp:    SpO2: 98% 97%    General: Awake, alert and oriented no distress.  Head normocephalic atraumatic Eyes pupils equal round reactive extraocular movements intact Mouth no erythema or exudate the tongue deviates to the left CV:  Good peripheral perfusion.  Heart regular rate and rhythm no audible murmurs Resp:  Normal effort.  Lungs are clear Abd:  No distention.  Soft and nontender Extremities with no edema Neurological exam cranial nerves II through XII are intact except for as noted above visual fields are intact laterally to confrontation cerebellar finger-nose rapid alternating movements and hands and heel-to-shin are intact motor strength is 5/5 throughout   ED Results / Procedures / Treatments   Labs (all labs ordered are listed, but only  abnormal results are displayed) Labs Reviewed  CBC - Abnormal; Notable for the following components:      Result Value   WBC 14.1 (*)    All other components within normal limits  DIFFERENTIAL - Abnormal; Notable for the following components:   Neutro Abs 10.1 (*)    All other components within normal limits  COMPREHENSIVE METABOLIC PANEL - Abnormal; Notable for the following components:   Glucose, Bld 124 (*)    Calcium 8.6 (*)    All other components within normal limits  CBG MONITORING, ED - Abnormal; Notable for the following components:   Glucose-Capillary 132 (*)    All other components within normal limits  PROTIME-INR  APTT  ETHANOL  I-STAT CREATININE, ED     EKG     RADIOLOGY CT evaluated and interpreted by me and then the neurologist and finally radiology is negative  PROCEDURES:  Critical Care performed: Critical care time 30 minutes.  This includes evaluating the patient in CT scan and speaking with the neurologist reviewing his note and then speaking to the hospitalist.  Procedures   MEDICATIONS ORDERED  IN ED: Medications  sodium chloride flush (NS) 0.9 % injection 3 mL (has no administration in time range)  iohexol (OMNIPAQUE) 350 MG/ML injection 75 mL (75 mLs Intravenous Contrast Given 09/13/22 1642)  aspirin chewable tablet 324 mg (324 mg Oral Given 09/13/22 1750)     IMPRESSION / MDM / ASSESSMENT AND PLAN / ED COURSE  I reviewed the triage vital signs and the nursing notes. Patient falling to the right with some ataxia which is now almost completely gone.  Differential diagnosis includes, but is not limited to, CVA TIA multiple sclerosis in her ear problems  Patient's presentation is most consistent with acute presentation with potential threat to life or bodily function.  The patient is on the cardiac monitor to evaluate for evidence of arrhythmia and/or significant heart rate changes.  None have been seen    FINAL CLINICAL IMPRESSION(S) / ED  DIAGNOSES   Final diagnoses:  Cerebrovascular accident (CVA), unspecified mechanism ([place])     Rx / DC Orders   ED Discharge Orders          Ordered    clopidogrel (PLAVIX) 75 MG tablet  Daily        09/13/22 1800             Note:  This document was prepared using Dragon voice recognition software and may include unintentional dictation errors.   Nena Polio, MD 09/13/22 [phone]

## 2022-09-13 NOTE — ED Notes (Signed)
Called to [employer]  per Charge RN Heather/Activated Code Stroke/Rep:Tina

## 2022-09-13 NOTE — ED Triage Notes (Signed)
Pt via POV from home. Pt accompanied by son. Pt c/o R sided weakness and loss of coordination. Son states LKW 2:45pm today. Denies pain. No facial droop noted at this time. Pt is A&Ox4 and NAD

## 2022-09-13 NOTE — ED Notes (Signed)
Patient transported to MRI 

## 2022-09-14 ENCOUNTER — Observation Stay (HOSPITAL_BASED_OUTPATIENT_CLINIC_OR_DEPARTMENT_OTHER)
Admit: 2022-09-14 | Discharge: 2022-09-14 | Disposition: A | Discharge status: Home / Self Care | Payer: Medicare HMO | Attending: Internal Medicine | Admitting: Internal Medicine

## 2022-09-14 DIAGNOSIS — R42 Dizziness and giddiness: Secondary | ICD-10-CM

## 2022-09-14 DIAGNOSIS — R110 Nausea: Secondary | ICD-10-CM

## 2022-09-14 DIAGNOSIS — G459 Transient cerebral ischemic attack, unspecified: Secondary | ICD-10-CM

## 2022-09-14 LAB — HEMOGLOBIN A1C
Hgb A1c MFr Bld: 5.9 % — ABNORMAL HIGH (ref 4.8–5.6)
Mean Plasma Glucose: 122.63 mg/dL

## 2022-09-14 LAB — LIPID PANEL
Cholesterol: 97 mg/dL (ref 0–200)
HDL: 39 mg/dL — ABNORMAL LOW (ref >40)
LDL Cholesterol: 47 mg/dL (ref 0–99)
Total CHOL/HDL Ratio: 2.5 RATIO
Triglycerides: 54 mg/dL (ref <150)
VLDL: 11 mg/dL (ref 0–40)

## 2022-09-14 LAB — RESPIRATORY PANEL BY PCR

## 2022-09-14 LAB — ECHOCARDIOGRAM COMPLETE
AR max vel: 1.91 cm2
AV Area VTI: 1.85 cm2
AV Area mean vel: 1.82 cm2
AV Mean grad: 2 mmHg
AV Peak grad: 3.9 mmHg
Ao pk vel: 0.98 m/s
Area-P 1/2: 3.63 cm2
S' Lateral: 3.5 cm

## 2022-09-14 LAB — C-REACTIVE PROTEIN: CRP: <0.5 mg/dL (ref <1.0)

## 2022-09-14 MED ORDER — ASPIRIN EC 81 MG PO TBEC: 81.0000 mg | DELAYED_RELEASE_TABLET | Freq: Every day | ORAL | Status: AC

## 2022-09-14 NOTE — Progress Notes (Signed)
*  PRELIMINARY RESULTS* Echocardiogram 2D Echocardiogram has been performed.  Nicholas Fleming 09/14/2022, 11:10 AM

## 2022-09-14 NOTE — Progress Notes (Signed)
SLP Cancellation Note  Patient Details Name: Jehu Mccauslin MRN: [id number] DOB: Jun 12, 1946   Cancelled treatment:       Reason Eval/Treat Not Completed:  (pt discharged prior to ST screen. Per chart notes, pt's s/s resolved; no c/o difficulty w/ his speech per Neurology/MD/NSG notes.)     Orinda Kenner, [place], CCC-SLP Speech Language Pathologist Rehab Services; [place] [phone] (ascom) Lynnett Langlinais 09/14/2022, 2:54 PM

## 2022-09-14 NOTE — Evaluation (Signed)
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 Pt son present at end of evaluation. Pt is alert and oriented x4, good safety awareness. PTA pt is indep in ADL/IADL. On this pate pt performs ADL/functional mobility at supervision-CGA level. Pt and son endorse pt is close to baseline. Pt has good support at home. No further OT need identified. OT will sign off, please re-consult if there is a change in functional status.      Recommendations for follow up therapy are one component of a multi-disciplinary discharge planning process, led by the attending physician.  Recommendations may be updated based on patient status, additional functional criteria and insurance authorization.   Follow Up Recommendations  No OT follow up    Assistance Recommended at Discharge Intermittent Supervision/Assistance  Patient can return home with the following Assistance with cooking/housework;Help with stairs or ramp for entrance    Functional Status Assessment  Patient has had a recent decline in their functional status and demonstrates the ability to make significant improvements in function in a reasonable and predictable amount of time.  Equipment Recommendations  None recommended by OT    Recommendations for Other Services       Precautions / Restrictions Precautions Precautions: Fall Restrictions Weight Bearing Restrictions: No      Mobility Bed Mobility Overal bed mobility: Modified Independent                  Transfers Overall transfer level: Needs  assistance Equipment used: None Transfers: Sit to/from Stand Sit to Stand: Supervision                  Balance Overall balance assessment: Needs assistance Sitting-balance support: Feet supported Sitting balance-Leahy Scale: Good     Standing balance support: During functional activity, No upper extremity supported Standing balance-Leahy Scale: Fair                             ADL either performed or assessed with clinical judgement   ADL Overall ADL's : Needs assistance/impaired Eating/Feeding: Set up;Sitting   Grooming: Wash/dry hands;Wash/dry face;Oral care;Standing;Supervision/safety               Lower Body Dressing: Supervision/safety   Toilet Transfer: Supervision/safety;Min guard   Writer and Hygiene: Supervision/safety Toileting - Clothing Manipulation Details (indicate cue type and reason): simulated     Functional mobility during ADLs: Supervision/safety;Min guard (approx 150' in hall with no AD)       Vision Patient Visual Report: No change from baseline       Perception     Praxis      Pertinent Vitals/Pain Pain Assessment Pain Assessment: No/denies pain     Hand Dominance     Extremity/Trunk Assessment Upper Extremity Assessment Upper Extremity Assessment: Overall WFL for tasks assessed   Lower Extremity Assessment Lower Extremity Assessment: Overall WFL for tasks assessed   Cervical / Trunk Assessment Cervical / Trunk Assessment: Normal   Communication Communication Communication: No difficulties   Cognition Arousal/Alertness: Awake/alert  Behavior During Therapy: WFL for tasks assessed/performed Overall Cognitive Status: Within Functional Limits for tasks assessed                                       General Comments  vital signs monitored and stable throughout    Exercises     Shoulder Instructions      Home Living Family/patient expects to be discharged  to:: Private residence Living Arrangements: Children;Other (Comment);Spouse/significant other (39 month old grandson) Available Help at Discharge: Family;Available 24 hours/day Type of Home: House Home Access: Stairs to enter [employer] of Steps: 3 Entrance Stairs-Rails: Left Home Layout: Two level Alternate Level Stairs-Number of Steps: 16 Alternate Level Stairs-Rails: Left Bathroom Shower/Tub: Teacher, early years/pre: Standard Bathroom Accessibility: Yes   Home Equipment: None          Prior Functioning/Environment Prior Level of Function : Independent/Modified Independent               ADLs Comments: indep in ADL/IADL, assists with caring for his grandson        OT Problem List:        OT Treatment/Interventions:      OT Goals(Current goals can be found in the care plan section) Acute Rehab OT Goals Patient Stated Goal: go home OT Goal Formulation: With patient Time For Goal Achievement: 09/28/22 Potential to Achieve Goals: Good  OT Frequency:      Co-evaluation              AM-PAC OT "6 Clicks" Daily Activity     Outcome Measure Help from another person eating meals?: None Help from another person taking care of personal grooming?: None Help from another person toileting, which includes using toliet, bedpan, or urinal?: None Help from another person bathing (including washing, rinsing, drying)?: A Little Help from another person to put on and taking off regular upper body clothing?: None Help from another person to put on and taking off regular lower body clothing?: None 6 Click Score: 23   End of Session Equipment Utilized During Treatment: Gait belt Nurse Communication: Mobility status  Activity Tolerance: Patient tolerated treatment well Patient left: in bed;with family/visitor present;with call bell/phone within reach  OT Visit Diagnosis: Unsteadiness on feet (R26.81)                Time: 2694-8546 OT Time  Calculation (min): 26 min Charges:  OT General Charges $OT Visit: 1 Visit OT Evaluation $OT Eval Low Complexity: 1 Low  Shanon Payor, OTD OTR/L  09/14/22, 9:34 AM

## 2022-09-14 NOTE — ED Notes (Signed)
Discharge instructions reviewed with patient. Patient questions answered and opportunity for education reviewed. Patient voices understanding of discharge instructions with no further questions. Patient ambulatory with steady gait to lobby.  

## 2022-09-14 NOTE — Progress Notes (Signed)
Neurology progress note  S: All symptoms have resolved. No vertigo. Patient is feeling well with no complaints. Gait is steady; PT cleared for discharge.  Interval data:  MRI brain wo contrast - neg for stroke or other acute process  Prior data:  CTA H&N - No emergent large vessel occlusion or proximal hemodynamically significant stenosis.  CTH NAICP  CNS personally reviewed  TTE - normal LVEF, grade I diastolic dysfunction, moderate LA dilation, no intracardiac clot, neg bubble  Stroke Labs     Component Value Date/Time   CHOL 97 09/14/2022 0605   CHOL 108 07/27/2021 1539   TRIG 54 09/14/2022 0605   HDL 39 (L) 09/14/2022 0605   HDL 45 07/27/2021 1539   CHOLHDL 2.5 09/14/2022 0605   VLDL 11 09/14/2022 0605   LDLCALC 47 09/14/2022 0605   LDLCALC 46 07/27/2021 1539   LABVLDL 17 07/27/2021 1539    Lab Results  Component Value Date/Time   HGBA1C 5.9 (H) 09/14/2022 06:05 AM    Physical Exam Gen: A&Ox4, NAD HEENT: Atraumatic, normocephalic; oropharynx clear, tongue without atrophy or fasciculations. Resp: CTAB, normal work of breathing CV: RRR, extremities appear well-perfused. Abd: soft/NT/ND Extrem: Nml bulk; no cyanosis, clubbing, or edema.  Neuro: *MS: A&O x4. Follows multi-step commands.  *Speech: no dysarthria or aphasia, able to name and repeat. *CN:    I: Deferred   II,III: PERRLA, VFF by confrontation, optic discs not visualized 2/2 pupillary constriction   III,IV,VI: EOMI w/o nystagmus, no ptosis   V: Sensation intact from V1 to V3 to LT   VII: Eyelid closure was full.  Smile symmetric.   VIII: Hearing intact to voice   IX,X: Voice normal, palate elevates symmetrically    XI: SCM/trap 5/5 bilat   XII: Tongue protrudes midline, no atrophy or fasciculations  *Motor:   Normal bulk.  No tremor, rigidity or bradykinesia. No pronator drift.   Strength: Dlt Bic Tri WE WrF FgS Gr HF KnF KnE PlF DoF    Left '5 5 5 5 5 5 5 5 5 5 5 5    '$ Right '5 5 5 5 5 5 5 5 5  5 5 5   '$ *Sensory: Intact to light touch, pinprick, temperature vibration throughout. Symmetric. Propioception intact bilat.  No double-simultaneous extinction.  *Coordination:  Finger-to-nose, heel-to-shin, rapid alternating motions were intact. *Reflexes:  2+ and symmetric throughout without clonus; toes down-going bilat *Gait: slightly stooped, steady  NIHSS = 71   A/P: 76 year old gentleman with sudden onset of dizziness, vomiting, and leading to the right.  MRI brain was negative for acute infarct.  Symptoms are favored to be posterior circulation TIA given his multiple vascular risk factors including hypertension, diabetes, and coronary artery disease.  Differential also includes peripheral vertigo.   - Given c/f TIA and e/o LA dilation on TTE recommend patient be discharged with amb cardiac monitor to eval for possible occult a fib. If monitor cannot be placed in hospital please contact cardiology to arrange for it to be placed by next week in clinic. - Otherwise TIA workup is completed - Goal normotension - Continue crestor '40mg'$  daily - ASA '81mg'$  daily + plavix '75mg'$  daily x21 days f/b plavix '75mg'$  daily monotherapy after that - Stroke education - I will arrange outpatient neurology f/u with Dr. Leonie Man  Neurology to sign off but please re-engage if new neurologic concerns arise.   Su Monks, MD [facility] [phone]  If 7pm- 7am, please page neurology on call as listed in [place].   **  Any copied and pasted documentation in this note was written by me in another application not separately billed for and pasted here by me.

## 2022-09-14 NOTE — Evaluation (Signed)
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 Pt demonstrates all bed mobility/transfers/ambulation at baseline level. RAMPs, coordination, and strength testing WNL. Demonstrates excellent balance without requiring AD for mobility. No issues with sensation. Pt does not require any further PT needs at this time. Pt will be dc in house and does not require follow up. RN aware. Will dc current orders.      Recommendations for follow up therapy are one component of a multi-disciplinary discharge planning process, led by the attending physician.  Recommendations may be updated based on patient status, additional functional criteria and insurance authorization.  Follow Up Recommendations No PT follow up      Assistance Recommended at Discharge PRN  Patient can return home with the following       Equipment Recommendations None recommended by PT  Recommendations for Other Services       Functional Status Assessment Patient has not had a recent decline in their functional status     Precautions / Restrictions Precautions Precautions: Fall Restrictions Weight Bearing Restrictions: No      Mobility  Bed Mobility               General bed mobility comments: received on EOB    Transfers Overall transfer level: Modified independent Equipment used: None Transfers: Sit to/from Stand Sit to Stand: Modified independent (Device/Increase time)           General transfer comment: takes slightly extended time    Ambulation/Gait Ambulation/Gait assistance:  Supervision Gait Distance (Feet): 150 Feet Assistive device: None Gait Pattern/deviations: WFL(Within Functional Limits)       General Gait Details: good gait and station. 12/12 on [place]. Noted decreased neck ROM during test in all directions as well as decreased B arm swing  Stairs            Wheelchair Mobility    Modified Rankin (Stroke Patients Only)       Balance Overall balance assessment: Needs assistance Sitting-balance support: Feet supported Sitting balance-Leahy Scale: Good Sitting balance - Comments: able to stand with eyes closed maintaining balance for >10sec   Standing balance support: During functional activity, No upper extremity supported Standing balance-Leahy Scale: Fair                               Pertinent Vitals/Pain Pain Assessment Pain Assessment: No/denies pain    Home Living Family/patient expects to be discharged to:: Private residence Living Arrangements: Children;Other (Comment);Spouse/significant other Available Help at Discharge: Family;Available 24 hours/day Type of Home: House Home Access: Stairs to enter Entrance Stairs-Rails: Left Entrance Stairs-Number of Steps: 3 Alternate Level Stairs-Number of Steps: 16 Home Layout: Two level Home Equipment: None      Prior Function Prior Level of Function : Independent/Modified Independent             Mobility Comments: no recent falls; not driving ADLs Comments: indep in ADL/IADL, assists with caring for his grandson     Hand Dominance  Extremity/Trunk Assessment   Upper Extremity Assessment Upper Extremity Assessment: Overall WFL for tasks assessed    Lower Extremity Assessment Lower Extremity Assessment: Overall WFL for tasks assessed    Cervical / Trunk Assessment Cervical / Trunk Assessment: Normal  Communication   Communication: No difficulties  Cognition Arousal/Alertness: Awake/alert Behavior During Therapy: WFL for tasks  assessed/performed Overall Cognitive Status: Within Functional Limits for tasks assessed                                          General Comments General comments (skin integrity, edema, etc.): vital signs monitored and stable throughout    Exercises     Assessment/Plan    PT Assessment Patient does not need any further PT services  PT Problem List         PT Treatment Interventions      PT Goals (Current goals can be found in the Care Plan section)  Acute Rehab PT Goals Patient Stated Goal: to go home PT Goal Formulation: All assessment and education complete, DC therapy Time For Goal Achievement: 09/14/22 Potential to Achieve Goals: Good    Frequency       Co-evaluation               AM-PAC PT "6 Clicks" Mobility  Outcome Measure Help needed turning from your back to your side while in a flat bed without using bedrails?: None Help needed moving from lying on your back to sitting on the side of a flat bed without using bedrails?: None Help needed moving to and from a bed to a chair (including a wheelchair)?: None Help needed standing up from a chair using your arms (e.g., wheelchair or bedside chair)?: None Help needed to walk in hospital room?: None Help needed climbing 3-5 steps with a railing? : A Little 6 Click Score: 23    End of Session   Activity Tolerance: Patient tolerated treatment well Patient left: in bed;with family/visitor present Nurse Communication: Mobility status PT Visit Diagnosis: Muscle weakness (generalized) (M62.81);Difficulty in walking, not elsewhere classified (R26.2)    Time: 7341-9379 PT Time Calculation (min) (ACUTE ONLY): 8 min   Charges:   PT Evaluation $PT Eval Low Complexity: 1 Low          Nicholas Fleming, PT, DPT, GCS [phone]   Nicholas Fleming 09/14/2022, 10:24 AM

## 2022-09-14 NOTE — Discharge Summary (Signed)
Physician Discharge Summary   Nicholas Fleming  male DOB: 01/02/1946  ZHY:[id number]  PCP: [name], DO  Admit date: 09/13/2022 Discharge date: 09/14/2022  Admitted From: home Disposition:  home Son updated at bedside prior to discharge. CODE STATUS: Full code  Discharge Instructions     Discharge instructions   Complete by: As directed    You may have had TIA, a temporary stroke.  To reduce stroke risk, neurologist recommends taking aspirin 81 and plavix 75 mg together for 21 days, and then after that, just plavix alone.   Dr. Enzo Bi [facility] Course:  For full details, please see H&P, progress notes, consult notes and ancillary notes.  Briefly,  Nicholas Fleming is a 76 y.o. 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  Pt had similar episode one year ago which resolved on its own within 30 minutes.  After presentation, all symptoms resolved.  TIA  MRI brain wo contrast - neg for stroke or other acute process.  CTA H&N - No emergent large vessel occlusion or proximal hemodynamically significant stenosis.  TTE - normal LVEF, grade I diastolic dysfunction, moderate LA dilation, no intracardiac clot, neg bubble. --Neuro consulted; Symptoms are favored to be posterior circulation TIA given his multiple vascular risk factors including hypertension, diabetes, and coronary artery disease. - ASA '81mg'$  daily + plavix '75mg'$  daily x21 days f/b plavix '75mg'$  daily monotherapy after that - LDL 47 at goal.  Continue home crestor '40mg'$  daily  --cardiac monitor set up prior to discharge with [facility] cardiology. --outpatient neurology f/u with Dr. Leonie Man  IPF -continue home Ofev/symbicort    CAD -s/p remote stents  -stable ekg   -continue asa, plavix, statin, metoprolol   HTN --resume home metop   Hypothyrodism -cont synthroid   GERD -ppi    Discharge Diagnoses:  Principal Problem:   TIA (transient ischemic attack)     Discharge Instructions:  Allergies as of 09/14/2022   No Known Allergies      Medication List     TAKE these medications    albuterol 108 (90 Base) MCG/ACT inhaler Commonly known as: Proventil HFA Inhale 1-2 puffs into the lungs every 6 (six) hours as needed for wheezing or shortness of breath.   aspirin EC 81 MG tablet Take 1 tablet (81 mg total) by mouth daily for 21 days.   budesonide-formoterol 80-4.5 MCG/ACT inhaler Commonly known as: SYMBICORT Inhale 2 puffs into the lungs 2 (two) times daily.   cholecalciferol 25 MCG (1000 UNIT) tablet Commonly known as: VITAMIN D3 Take 1,000 Units by mouth daily.   clopidogrel 75 MG tablet Commonly known as: Plavix Take 1 tablet (75 mg total) by mouth daily.   ketoconazole 2 % cream Commonly known as: NIZORAL Apply 1 application. topically daily. Qhs to feet   levothyroxine 50 MCG tablet Commonly known as: Synthroid Take 1 tablet (50 mcg total) by mouth daily.   metoprolol succinate 50 MG 24 hr tablet Commonly known as: TOPROL-XL Take 1 tablet (50 mg total) by mouth daily. Take with or immediately following a meal.   Ofev 150 MG Caps Generic drug: Nintedanib Take 1 capsule (150 mg total) by mouth 2 (two) times daily.   pantoprazole 40 MG  tablet Commonly known as: PROTONIX TAKE ONE TABLET BY MOUTH DAILY   rosuvastatin 20 MG tablet Commonly known as: CRESTOR Take 1 tablet (20 mg total) by mouth daily.   Skyrizi 150 MG/ML Sosy Generic drug: Risankizumab-rzaa Inject 150 mg into the skin as directed. Every 12 weeks for maintenance.         Follow-up Information     [name], DO Follow up in 1 week(s).   Specialty: Internal Medicine Contact information: [place]  [address] [phone]                 No Known Allergies   The results of significant diagnostics from this hospitalization (including imaging, microbiology, ancillary and laboratory) are listed below for reference.   Consultations:   Procedures/Studies: DG Chest Port 1 View  Result Date: 09/13/2022 CLINICAL DATA:  Leukocytosis EXAM: PORTABLE CHEST 1 VIEW COMPARISON:  Chest CT 02/28/2022.  Most recent plain film 10/09/2019 FINDINGS: Numerous leads and wires project over the chest. Patient rotated to the right. Normal heart size for level of inspiration. No large pleural effusion and no pneumothorax. Basilar and peripheral predominant reticular opacities are consistent with fibrosis when correlated with the prior CT. These are progressive since 10/09/2019. Given these findings and diminished lung volumes, no superimposed lobar consolidation identified. IMPRESSION: Progressive interstitial lung disease since 10/09/2019. No superimposed acute process. Electronically Signed   By: Abigail Miyamoto M.D.   On: 09/13/2022 20:12   MR BRAIN WO CONTRAST  Result Date: 09/13/2022 CLINICAL DATA:  Sudden onset vomiting, dizziness, and imbalance EXAM: MRI HEAD WITHOUT CONTRAST TECHNIQUE: Multiplanar, multiecho pulse sequences of the brain and surrounding structures were obtained without intravenous contrast. COMPARISON:  No prior MRI, correlation is made with CT head 09/13/2022 FINDINGS: Brain: No restricted diffusion to suggest acute or subacute infarct. No acute hemorrhage, mass, mass effect, or midline shift. No hemosiderin deposition to suggest remote hemorrhage. No hydrocephalus or extra-axial collection. Scattered T2 hyperintense signal in the periventricular white matter, likely the sequela of mild chronic small vessel ischemic disease. Cerebral volume is within normal limits for age. Partial empty sella. Normal craniocervical junction. Vascular: Normal arterial flow voids. Skull and upper cervical  spine: Normal marrow signal. Sinuses/Orbits: Mucosal thickening in the right maxillary sinus and left sphenoid sinus. No acute finding in the orbits. Other: None. IMPRESSION: No acute intracranial process. No evidence of acute or subacute infarct. Electronically Signed   By: Merilyn Baba M.D.   On: 09/13/2022 19:56   CT ANGIO HEAD NECK W WO CM (CODE STROKE)  Result Date: 09/13/2022 CLINICAL DATA:  Stroke, follow up EXAM: CT ANGIOGRAPHY HEAD AND NECK TECHNIQUE: Multidetector CT imaging of the head and neck was performed using the standard protocol during bolus administration of intravenous contrast. Multiplanar CT image reconstructions and MIPs were obtained to evaluate the vascular anatomy. Carotid stenosis measurements (when applicable) are obtained utilizing NASCET criteria, using the distal internal carotid diameter as the denominator. RADIATION DOSE REDUCTION: This exam was performed according to the departmental dose-optimization program which includes automated exposure control, adjustment of the mA and/or kV according to patient size and/or use of iterative reconstruction technique. CONTRAST:  60m OMNIPAQUE IOHEXOL 350 MG/ML SOLN COMPARISON:  None Available. FINDINGS: CTA NECK FINDINGS Aortic arch: Great vessel origins are patent without significant stenosis. Right carotid system: No evidence of dissection, stenosis (50% or greater), or occlusion. Tortuous ICA at the skull base. Left carotid system: No evidence of dissection, stenosis (50% or greater),  or occlusion. Tortuous ICA at the skull base. Vertebral arteries: Severe right vertebral origin stenosis due to atherosclerosis. Otherwise, vertebral arteries are patent without significant stenosis stroke. Right dominant. Skeleton: Severe multilevel degenerative change in the cervical spine. Other neck: No acute findings. Upper chest: Fibrotic changes in the visualized lung apices. Please see prior high-resolution CT of the chest for further  characterization. Review of the MIP images confirms the above findings CTA HEAD FINDINGS Anterior circulation: Bilateral intracranial ICAs MCAs, and ACAs are patent without proximal hemodynamically significant stenosis. Posterior circulation: Bilateral intradural vertebral arteries basilar artery and bilateral posterior cerebral arteries are patent without proximal hemodynamically significant stenosis. Left fetal type PCA, anatomic variant. Venous sinuses: As permitted by contrast timing, patent. Anatomic variants: Detailed above. Review of the MIP images confirms the above findings IMPRESSION: No emergent large vessel occlusion or proximal hemodynamically significant stenosis. Electronically Signed   By: Margaretha Sheffield M.D.   On: 09/13/2022 16:58   CT HEAD CODE STROKE WO CONTRAST  Result Date: 09/13/2022 CLINICAL DATA:  Code stroke.  Neuro deficit, acute, stroke suspected EXAM: CT HEAD WITHOUT CONTRAST TECHNIQUE: Contiguous axial images were obtained from the base of the skull through the vertex without intravenous contrast. RADIATION DOSE REDUCTION: This exam was performed according to the departmental dose-optimization program which includes automated exposure control, adjustment of the mA and/or kV according to patient size and/or use of iterative reconstruction technique. COMPARISON:  None Available. FINDINGS: Brain: No evidence of acute large vascular territory infarction, hemorrhage, hydrocephalus, extra-axial collection or mass lesion/mass effect. Partially empty sella. Vascular: No hyperdense vessel identified. Skull: No acute fracture. Sinuses/Orbits: No acute findings. ASPECTS [facility] Stroke Program Early CT Score) total score (0-10 with 10 being normal): 10. IMPRESSION: 1. No evidence of acute intracranial abnormality. 2. ASPECTS is 10. Code stroke imaging results were communicated on 09/13/2022 at 4:45 pm to provider Dr. Quentin Cornwall via telephone, who verbally acknowledged these results.  Electronically Signed   By: Margaretha Sheffield M.D.   On: 09/13/2022 16:46      Labs: BNP (last 3 results) Recent Labs    05/04/22 1136 09/13/22 1627  BNP 29.7 93.7   Basic Metabolic Panel: Recent Labs  Lab 09/13/22 1627  NA 137  K 4.0  CL 103  CO2 26  GLUCOSE 124*  BUN 11  CREATININE 0.96  CALCIUM 8.6*   Liver Function Tests: Recent Labs  Lab 09/13/22 1627  AST 32  ALT 20  ALKPHOS 44  BILITOT 0.9  PROT 7.5  ALBUMIN 3.9   No results for input(s): "LIPASE", "AMYLASE" in the last 168 hours. No results for input(s): "AMMONIA" in the last 168 hours. CBC: Recent Labs  Lab 09/13/22 1627  WBC 14.1*  NEUTROABS 10.1*  HGB 15.9  HCT 49.4  MCV 98.8  PLT 214   Cardiac Enzymes: No results for input(s): "CKTOTAL", "CKMB", "CKMBINDEX", "TROPONINI" in the last 168 hours. BNP: Invalid input(s): "POCBNP" CBG: Recent Labs  Lab 09/13/22 1624  GLUCAP 132*   D-Dimer No results for input(s): "DDIMER" in the last 72 hours. Hgb A1c Recent Labs    09/14/22 0605  HGBA1C 5.9*   Lipid Profile Recent Labs    09/14/22 0605  CHOL 97  HDL 39*  LDLCALC 47  TRIG 54  CHOLHDL 2.5   Thyroid function studies No results for input(s): "TSH", "T4TOTAL", "T3FREE", "THYROIDAB" in the last 72 hours.  Invalid input(s): "FREET3" Anemia work up No results for input(s): "VITAMINB12", "FOLATE", "FERRITIN", "TIBC", "IRON", "RETICCTPCT" in the last  72 hours. Urinalysis    Component Value Date/Time   COLORURINE STRAW (A) 09/13/2022 2213   APPEARANCEUR CLEAR (A) 09/13/2022 2213   APPEARANCEUR Clear 10/09/2019 1220   LABSPEC 1.011 09/13/2022 2213   PHURINE 7.0 09/13/2022 2213   GLUCOSEU NEGATIVE 09/13/2022 2213   HGBUR NEGATIVE 09/13/2022 2213   BILIRUBINUR NEGATIVE 09/13/2022 2213   BILIRUBINUR Negative 10/09/2019 1220   KETONESUR NEGATIVE 09/13/2022 2213   PROTEINUR NEGATIVE 09/13/2022 2213   NITRITE NEGATIVE 09/13/2022 2213   LEUKOCYTESUR NEGATIVE 09/13/2022 2213    Sepsis Labs Recent Labs  Lab 09/13/22 1627  WBC 14.1*   Microbiology Recent Results (from the past 240 hour(s))  Respiratory (~20 pathogens) panel by PCR     Status: None   Collection Time: 09/13/22  9:40 PM   Specimen: Nasopharyngeal Swab; Respiratory  Result Value Ref Range Status   Adenovirus NOT DETECTED NOT DETECTED Final   Coronavirus 229E NOT DETECTED NOT DETECTED Final    Comment: (NOTE) The Coronavirus on the Respiratory Panel, DOES NOT test for the novel  Coronavirus (2019 nCoV)    Coronavirus HKU1 NOT DETECTED NOT DETECTED Final   Coronavirus NL63 NOT DETECTED NOT DETECTED Final   Coronavirus OC43 NOT DETECTED NOT DETECTED Final   Metapneumovirus NOT DETECTED NOT DETECTED Final   Rhinovirus / Enterovirus NOT DETECTED NOT DETECTED Final   Influenza A NOT DETECTED NOT DETECTED Final   Influenza B NOT DETECTED NOT DETECTED Final   Parainfluenza Virus 1 NOT DETECTED NOT DETECTED Final   Parainfluenza Virus 2 NOT DETECTED NOT DETECTED Final   Parainfluenza Virus 3 NOT DETECTED NOT DETECTED Final   Parainfluenza Virus 4 NOT DETECTED NOT DETECTED Final   Respiratory Syncytial Virus NOT DETECTED NOT DETECTED Final   Bordetella pertussis NOT DETECTED NOT DETECTED Final   Bordetella Parapertussis NOT DETECTED NOT DETECTED Final   Chlamydophila pneumoniae NOT DETECTED NOT DETECTED Final   Mycoplasma pneumoniae NOT DETECTED NOT DETECTED Final    Comment: Performed at [facility], [place]. [address], [place], [place] [address]     Total time spend on discharging this patient, including the last patient exam, discussing the hospital stay, instructions for ongoing care as it relates to all pertinent caregivers, as well as preparing the medical discharge records, prescriptions, and/or referrals as applicable, is 45 minutes.    Enzo Bi, MD  [facility] 09/14/2022, 9:38 AM

## 2022-09-25 ENCOUNTER — Ambulatory Visit: Payer: Medicare HMO | Admitting: Dermatology

## 2022-09-25 ENCOUNTER — Other Ambulatory Visit: Payer: Self-pay

## 2022-09-25 ENCOUNTER — Encounter: Payer: Self-pay | Admitting: Dermatology

## 2022-09-25 DIAGNOSIS — L400 Psoriasis vulgaris: Secondary | ICD-10-CM | POA: Diagnosis not present

## 2022-09-25 DIAGNOSIS — L409 Psoriasis, unspecified: Secondary | ICD-10-CM

## 2022-09-25 DIAGNOSIS — K580 Irritable bowel syndrome with diarrhea: Secondary | ICD-10-CM

## 2022-09-25 DIAGNOSIS — Z79899 Other long term (current) drug therapy: Secondary | ICD-10-CM

## 2022-09-25 MED ORDER — RIFAXIMIN 550 MG PO TABS: 550.0000 mg | ORAL_TABLET | Freq: Three times a day (TID) | ORAL | 0 refills | Status: AC

## 2022-09-25 MED ORDER — SKYRIZI 150 MG/ML ~~LOC~~ SOSY: 150.0000 mg | PREFILLED_SYRINGE | SUBCUTANEOUS | 1 refills | Status: DC

## 2022-09-25 NOTE — Progress Notes (Signed)
   Follow-Up Visit   Subjective  Nicholas Fleming is a 76 y.o. male who presents for the following: Psoriasis (6 month recheck. On Skyrizi since 05/30/2021, uses Enstilar as needed. Psoriasis has improved since starting [name]. Tolerating well, denies adverse reactions or injection site reactions).  Patient accompanied by daughter in law, who contributes to history.  The following portions of the chart were reviewed this encounter and updated as appropriate:  Tobacco  Allergies  Meds  Problems  Med Hx  Surg Hx  Fam Hx     Review of Systems: No other skin or systemic complaints except as noted in HPI or Assessment and Plan.  Objective  Well appearing patient in no apparent distress; mood and affect are within normal limits.  A focused examination was performed including knees, hands, elbows. Relevant physical exam findings are noted in the Assessment and Plan.  knees, elbows, hands Post inflammatory hyperpigmentation   Assessment & Plan  Psoriasis knees, elbows, hands  Psoriasis - severe on systemic "biologic" treatment injections.  Psoriasis is a chronic non-curable, but treatable genetic/hereditary disease that may have other systemic features affecting other organ systems such as joints (Psoriatic Arthritis).  It is linked with heart disease, inflammatory bowel disease, non-alcoholic fatty liver disease, and depression. Significant skin psoriasis and/or psoriatic arthritis may have significant symptoms and affects activities of daily activity and often benefits from systemic "biologic" injection treatments.  These "biologic" treatments have some potential side effects including immunosuppression and require pre-treatment laboratory screening and periodic laboratory monitoring and periodic in person evaluation and monitoring by the attending dermatologist physician (long term medication management).    TB lab ordered today <1% BSA Has visit with PCP 09/28/2022. Will have TB  drawn there.   Cont Skyrizi '150mg'$ /ml sq injection every 3 months. Cont Enstilar Foam qd prn flares     Reviewed risks of biologics including immunosuppression, infections, injection site reaction, and failure to improve condition. Goal is control of skin condition, not cure.  Some older biologics such as Humira and Enbrel may slightly increase risk of malignancy and may worsen congestive heart failure. The use of biologics requires long term medication management, including periodic office visits and monitoring of blood work.    Topical steroids (such as triamcinolone, fluocinolone, fluocinonide, mometasone, clobetasol, halobetasol, betamethasone, hydrocortisone) can cause thinning and lightening of the skin if they are used for too long in the same area. Your physician has selected the right strength medicine for your problem and area affected on the body. Please use your medication only as directed by your physician to prevent side effects.    Patient [place] out of [place] a time and trying to travel with the injectable medication would be hard.  Discussed with patient and daughter in law that if pt had to push back injection 1-2 months due to traveling out of the country that would not be a problem.  Related Procedures QuantiFERON-TB Gold Plus  Drug therapy  Related Procedures QuantiFERON-TB Gold Plus  Psoriasis vulgaris  Related Medications Risankizumab-rzaa (SKYRIZI) 150 MG/ML SOSY Inject 150 mg into the skin as directed. Every 12 weeks for maintenance.   Return in about 6 months (around 03/27/2023) for Psoriasis Follow Up, Skyrizi follow up.  I, JEmelia Salisbury CMA, am acting as scribe for DSarina Ser MD. Documentation: I have reviewed the above documentation for accuracy and completeness, and I agree with the above.  DSarina Ser MD

## 2022-09-25 NOTE — Patient Instructions (Signed)
Cont Skyrizi '150mg'$ /ml sq injection every 3 months. Cont Enstilar Foam qd prn flares     Reviewed risks of biologics including immunosuppression, infections, injection site reaction, and failure to improve condition. Goal is control of skin condition, not cure.  Some older biologics such as Humira and Enbrel may slightly increase risk of malignancy and may worsen congestive heart failure. The use of biologics requires long term medication management, including periodic office visits and monitoring of blood work.    Topical steroids (such as triamcinolone, fluocinolone, fluocinonide, mometasone, clobetasol, halobetasol, betamethasone, hydrocortisone) can cause thinning and lightening of the skin if they are used for too long in the same area. Your physician has selected the right strength medicine for your problem and area affected on the body. Please use your medication only as directed by your physician to prevent side effects.     Due to recent changes in healthcare laws, you may see results of your pathology and/or laboratory studies on MyChart before the doctors have had a chance to review them. We understand that in some cases there may be results that are confusing or concerning to you. Please understand that not all results are received at the same time and often the doctors may need to interpret multiple results in order to provide you with the best plan of care or course of treatment. Therefore, we ask that you please give [place] 2 business days to thoroughly review all your results before contacting the office for clarification. Should we see a critical lab result, you will be contacted sooner.   If You Need Anything After Your Visit  If you have any questions or concerns for your doctor, please call our main line at [phone] and press option 4 to reach your doctor's medical assistant. If no one answers, please leave a voicemail as directed and we will return your call as soon as possible.  Messages left after 4 pm will be answered the following business day.   You may also send [place] a message via [place]. We typically respond to MyChart messages within 1-2 business days.  For prescription refills, please ask your pharmacy to contact our office. Our fax number is [phone].  If you have an urgent issue when the clinic is closed that cannot wait until the next business day, you can page your doctor at the number below.    Please note that while we do our best to be available for urgent issues outside of office hours, we are not available 24/7.   If you have an urgent issue and are unable to reach [place], you may choose to seek medical care at your doctor's office, retail clinic, urgent care center, or emergency room.  If you have a medical emergency, please immediately call 911 or go to the emergency department.  Pager Numbers  - Dr. Nehemiah Massed: [phone]  - Dr. Laurence Ferrari: [phone]  - Dr. Nicole Kindred: [phone]  In the event of inclement weather, please call our main line at [phone] for an update on the status of any delays or closures.  Dermatology Medication Tips: Please keep the boxes that topical medications come in in order to help keep track of the instructions about where and how to use these. Pharmacies typically print the medication instructions only on the boxes and not directly on the medication tubes.   If your medication is too expensive, please contact our office at [phone] option 4 or send [place] a message through [place].   We are unable to tell what  your co-pay for medications will be in advance as this is different depending on your insurance coverage. However, we may be able to find a substitute medication at lower cost or fill out paperwork to get insurance to cover a needed medication.   If a prior authorization is required to get your medication covered by your insurance company, please allow [place] 1-2 business days to complete this process.  Drug  prices often vary depending on where the prescription is filled and some pharmacies may offer cheaper prices.  The website www.goodrx.com contains coupons for medications through different pharmacies. The prices here do not account for what the cost may be with help from insurance (it may be cheaper with your insurance), but the website can give you the price if you did not use any insurance.  - You can print the associated coupon and take it with your prescription to the pharmacy.  - You may also stop by our office during regular business hours and pick up a GoodRx coupon card.  - If you need your prescription sent electronically to a different pharmacy, notify our office through [facility] or by phone at [phone] option 4.     Si Usted Necesita Algo Despus de Su Visita  Tambin puede enviarnos un mensaje a travs de Pharmacist, community. Por lo general respondemos a los mensajes de MyChart en el transcurso de 1 a 2 das hbiles.  Para renovar recetas, por favor pida a su farmacia que se ponga en contacto con nuestra oficina. Harland Dingwall de fax es [place] [phone].  Si tiene un asunto urgente cuando la clnica est cerrada y que no puede esperar hasta el siguiente da hbil, puede llamar/localizar a su doctor(a) al nmero que aparece a continuacin.   Por favor, tenga en cuenta que aunque hacemos todo lo posible para estar disponibles para asuntos urgentes fuera del horario de [place], no estamos disponibles las 24 horas del da, los 7 das de la [place].   Si tiene un problema urgente y no puede comunicarse con nosotros, puede optar por buscar atencin mdica  en el consultorio de su doctor(a), en una clnica privada, en un centro de atencin urgente o en una sala de emergencias.  Si tiene Engineering geologist, por favor llame inmediatamente al 911 o vaya a la sala de emergencias.  Nmeros de bper  - Dr. Nehemiah Massed: [phone]  - Dra. Moye: [phone]  - Dra. Nicole Kindred:  [phone]  En caso de inclemencias del [place], por favor llame a Johnsie Kindred principal al [phone] para una actualizacin sobre el [place] de cualquier retraso o cierre.  Consejos para la medicacin en dermatologa: Por favor, guarde las cajas en las que vienen los medicamentos de uso tpico para ayudarle a seguir las instrucciones sobre dnde y cmo usarlos. Las farmacias generalmente imprimen las instrucciones del medicamento slo en las cajas y no directamente en los tubos del [place].   Si su medicamento es muy caro, por favor, pngase en contacto con Zigmund Daniel llamando al [phone] y presione la opcin 4 o envenos un mensaje a travs de Pharmacist, community.   No podemos decirle cul ser su copago por los medicamentos por adelantado ya que esto es diferente dependiendo de la cobertura de su seguro. Sin embargo, es posible que podamos encontrar un medicamento sustituto a Electrical engineer un formulario para que el seguro cubra el medicamento que se considera necesario.   Si se requiere una autorizacin previa para que su compaa de seguros [place] su medicamento,  por favor permtanos de 1 a 2 das hbiles para completar este proceso.  Los precios de los medicamentos varan con frecuencia dependiendo del Environmental consultant de dnde se surte la receta y alguna farmacias pueden ofrecer precios ms baratos.  El sitio web www.goodrx.com tiene cupones para medicamentos de Airline pilot. Los precios aqu no tienen en cuenta lo que podra costar con la ayuda del seguro (puede ser ms barato con su seguro), pero el sitio web puede darle el precio si no utiliz Research scientist (physical sciences).  - Puede imprimir el cupn correspondiente y llevarlo con su receta a la farmacia.  - Tambin puede pasar por nuestra oficina durante el horario de atencin regular y Charity fundraiser una tarjeta de cupones de GoodRx.  - Si necesita que su receta se enve electrnicamente a una farmacia diferente, informe a nuestra oficina a travs de  MyChart de [place] o por telfono llamando al [phone] y presione la opcin 4.

## 2022-09-28 ENCOUNTER — Ambulatory Visit (INDEPENDENT_AMBULATORY_CARE_PROVIDER_SITE_OTHER): Payer: Medicare HMO | Admitting: Internal Medicine

## 2022-09-28 ENCOUNTER — Encounter: Payer: Self-pay | Admitting: Internal Medicine

## 2022-09-28 ENCOUNTER — Other Ambulatory Visit: Payer: Medicare HMO

## 2022-09-28 ENCOUNTER — Encounter: Payer: Self-pay | Admitting: Dermatology

## 2022-09-28 VITALS — BP 119/75 | HR 94 | Temp 98.0°F | Ht 66.0 in | Wt 145.4 lb

## 2022-09-28 DIAGNOSIS — Z79899 Other long term (current) drug therapy: Secondary | ICD-10-CM

## 2022-09-28 DIAGNOSIS — L308 Other specified dermatitis: Secondary | ICD-10-CM | POA: Diagnosis not present

## 2022-09-28 DIAGNOSIS — Z0001 Encounter for general adult medical examination with abnormal findings: Secondary | ICD-10-CM | POA: Diagnosis not present

## 2022-09-28 DIAGNOSIS — R140 Abdominal distension (gaseous): Secondary | ICD-10-CM | POA: Diagnosis not present

## 2022-09-28 DIAGNOSIS — I2510 Atherosclerotic heart disease of native coronary artery without angina pectoris: Secondary | ICD-10-CM

## 2022-09-28 DIAGNOSIS — Z87891 Personal history of nicotine dependence: Secondary | ICD-10-CM

## 2022-09-28 DIAGNOSIS — J84112 Idiopathic pulmonary fibrosis: Secondary | ICD-10-CM | POA: Diagnosis not present

## 2022-09-28 DIAGNOSIS — I10 Essential (primary) hypertension: Secondary | ICD-10-CM

## 2022-09-28 DIAGNOSIS — Z9861 Coronary angioplasty status: Secondary | ICD-10-CM

## 2022-09-28 DIAGNOSIS — Z8673 Personal history of transient ischemic attack (TIA), and cerebral infarction without residual deficits: Secondary | ICD-10-CM | POA: Diagnosis not present

## 2022-09-28 DIAGNOSIS — D84821 Immunodeficiency due to drugs: Secondary | ICD-10-CM

## 2022-09-28 DIAGNOSIS — E119 Type 2 diabetes mellitus without complications: Secondary | ICD-10-CM | POA: Diagnosis not present

## 2022-09-28 NOTE — Assessment & Plan Note (Signed)
Recent A1c Controlled without medications.

## 2022-09-28 NOTE — Progress Notes (Signed)
Annual Wellness Visit     Patient: Nicholas Fleming, Male    DOB: 01/25/1946, 76 y.o.   MRN: [id number]  Subjective  Chief Complaint  Patient presents with   routine checkup   Diabetes    Earnest Grenda is a 76 y.o. male who presents today for his Annual Wellness Visit. He reports consuming a general diet. The patient does not participate in regular exercise at present. He generally feels well. He reports sleeping well. He does not have additional problems to discuss today.   He is planning to travel to [place] on November 7 and stay until March.   He is overall feeling well today he has seen multiple specialist recently and we have reconciled his medications and history.  He is following with Dr. Vaughan Browner of pulmonology for his pulmonary fibrosis He is seeing Dr. Nehemiah Massed of dermatology for psoriasis needs to have a chronic urine gold checked. He is seeing Dr. 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 He is following with Dr. Einar Gip of cardiology and has an upcoming appointment next week.   Vision:Not within last year  and Dental: No current dental problems and Receives regular dental care   Past Medical History:  Diagnosis Date   Allergy    Asthma    Bronchitis    Controlled type 2 diabetes mellitus ([place]) 10/09/2019   Essential hypertension 10/09/2019   Hypothyroidism 10/09/2019   Psoriasis       Medications: Outpatient Medications Prior to Visit  Medication Sig   albuterol (PROVENTIL HFA) 108 (90 Base) MCG/ACT inhaler Inhale 1-2 puffs into the lungs every 6 (six) hours as needed for wheezing or shortness of breath.   aspirin EC 81 MG tablet Take 1 tablet (81 mg total) by mouth daily for 21 days.   budesonide-formoterol (SYMBICORT) 80-4.5 MCG/ACT inhaler Inhale 2 puffs into the lungs 2 (two) times daily.   cholecalciferol (VITAMIN D3) 25 MCG (1000 UNIT) tablet Take 1,000 Units by mouth daily.    clopidogrel (PLAVIX) 75 MG tablet Take 1 tablet (75 mg total) by mouth daily.   ketoconazole (NIZORAL) 2 % cream Apply 1 application. topically daily. Qhs to feet   levothyroxine (SYNTHROID) 50 MCG tablet Take 1 tablet (50 mcg total) by mouth daily.   metoprolol succinate (TOPROL-XL) 50 MG 24 hr tablet Take 1 tablet (50 mg total) by mouth daily. Take with or immediately following a meal.   Nintedanib (OFEV) 150 MG CAPS Take 1 capsule (150 mg total) by mouth 2 (two) times daily.   pantoprazole (PROTONIX) 40 MG tablet TAKE ONE TABLET BY MOUTH DAILY   rifaximin (XIFAXAN) 550 MG TABS tablet Take 1 tablet (550 mg total) by mouth 3 (three) times daily for 14 days.   Risankizumab-rzaa (SKYRIZI) 150 MG/ML SOSY Inject 150 mg into the skin as directed. Every 12 weeks for maintenance.   rosuvastatin (CRESTOR) 20 MG tablet Take 1 tablet (20 mg total) by mouth daily.   No facility-administered medications prior to visit.    No Known Allergies  Patient Care Team: [name], DO as PCP - General (Internal Medicine)  ROS      Objective  BP 119/75 (BP Location: Right Arm, Patient Position: Sitting, Cuff Size: Normal)   Pulse 94   Temp 98 F (36.7 C) (Oral)   Ht _0  (1.676 m)   Wt 145 lb 6.4 oz (66 kg)   SpO2 98%   BMI 23.47 kg/m  BP Readings  from Last 3 Encounters:  09/28/22 119/75  09/14/22 118/75  05/04/22 116/68   Wt Readings from Last 3 Encounters:  09/28/22 145 lb 6.4 oz (66 kg)  05/04/22 141 lb 12.8 oz (64.3 kg)  03/06/22 145 lb 3.2 oz (65.9 kg)      Physical Exam Constitutional:      Appearance: Normal appearance.  Cardiovascular:     Rate and Rhythm: Normal rate and regular rhythm.     Pulses:          Dorsalis pedis pulses are 2+ on the right side and 2+ on the left side.       Posterior tibial pulses are 2+ on the right side and 2+ on the left side.  Pulmonary:     Comments: Fine bibasilar crackles Musculoskeletal:     Right lower leg: No edema.     Left lower  leg: No edema.  Neurological:     Mental Status: He is alert.  Psychiatric:        Mood and Affect: Mood normal.        Behavior: Behavior normal.       Most recent functional status assessment:    09/28/2022   11:16 AM  In your present state of health, do you have any difficulty performing the following activities:  Hearing? 0  Vision? 0  Difficulty concentrating or making decisions? 0  Walking or climbing stairs? 0  Dressing or bathing? 0  Doing errands, shopping? 0   Most recent fall risk assessment:    09/28/2022   11:17 AM  Fall Risk   Falls in the past year? 0  Number falls in past yr: 0  Injury with Fall? 0  Risk for fall due to : No Fall Risks  Follow up Falls evaluation completed    Most recent depression screenings:    09/28/2022   11:17 AM 05/04/2022   10:38 AM  PHQ 2/9 Scores  PHQ - 2 Score 0 0   Most recent cognitive screening:     No data to display            09/28/2022   12:06 PM  Montreal Cognitive Assessment   Visuospatial/ Executive (0/5) 3  Naming (0/3) 3  Attention: Read list of digits (0/2) 2  Attention: Read list of letters (0/1) 1  Attention: Serial 7 subtraction starting at 100 (0/3) 3  Language: Repeat phrase (0/2) 1  Language : Fluency (0/1) 0  Abstraction (0/2) 2  Delayed Recall (0/5) 4  Orientation (0/6) 6  Total 25  Adjusted Score (based on education) 25    Most recent Audit-C alcohol use screening     No data to display         A score of 3 or more in women, and 4 or more in men indicates increased risk for alcohol abuse, EXCEPT if all of the points are from question 1   Vision/Hearing Screen: No results found.  Last metabolic panel Lab Results  Component Value Date   GLUCOSE 124 (H) 09/13/2022   NA 137 09/13/2022   K 4.0 09/13/2022   CL 103 09/13/2022   CO2 26 09/13/2022   BUN 11 09/13/2022   CREATININE 0.96 09/13/2022   GFRNONAA >60 09/13/2022   CALCIUM 8.6 (L) 09/13/2022   PROT 7.5 09/13/2022    ALBUMIN 3.9 09/13/2022   LABGLOB 2.4 05/04/2022   AGRATIO 1.6 05/04/2022   BILITOT 0.9 09/13/2022   ALKPHOS 44 09/13/2022   AST 32 09/13/2022  ALT 20 09/13/2022   ANIONGAP 8 09/13/2022   Last lipids Lab Results  Component Value Date   CHOL 97 09/14/2022   HDL 39 (L) 09/14/2022   LDLCALC 47 09/14/2022   TRIG 54 09/14/2022   CHOLHDL 2.5 09/14/2022   Last hemoglobin A1c Lab Results  Component Value Date   HGBA1C 5.9 (H) 09/14/2022      No results found for any visits on 09/28/22.    Assessment & Plan   Annual wellness visit done today including the all of the following: Reviewed patient's Family Medical History Reviewed and updated list of patient's medical providers Assessment of cognitive impairment was done Assessed patient's functional ability Established a written schedule for health screening [place] Completed and Reviewed  Exercise Activities and Dietary recommendations  Goals       Care Coordination Activiites - Initial visit (pt-stated)      Patient needs are being met.  No additional concerns or needs. No barriers or unmet needs.  No interventions required or requested.  Patient is doing well and SW services are no needed a this time.         Immunization History  Administered Date(s) Administered   Influenza, Quadrivalent, Recombinant, Inj, Pf 10/04/2019   Influenza,inj,Quad PF,6+ Mos 10/02/2019   Influenza-Unspecified 08/18/2021   PFIZER(Purple Top)SARS-COV-2 Vaccination 03/17/2020, 04/06/2020   PNEUMOCOCCAL CONJUGATE-20 05/04/2022   Zoster Recombinat (Shingrix) 05/18/2021, 08/18/2021    Health Maintenance  Topic Date Due   OPHTHALMOLOGY EXAM  Never done   Diabetic kidney evaluation - Urine ACR  Never done   TETANUS/TDAP  Never done   COVID-19 Vaccine (3 - Pfizer risk series) 05/04/2020   INFLUENZA VACCINE  07/18/2022   HEMOGLOBIN A1C  03/15/2023   Diabetic kidney evaluation - GFR measurement  09/14/2023    FOOT EXAM  09/29/2023   Pneumonia Vaccine 69+ Years old  Completed   Hepatitis C Screening  Completed   Zoster Vaccines- Shingrix  Completed   HPV VACCINES  Aged Out   COLONOSCOPY (Pts 45-6yr Insurance coverage will need to be confirmed)  Discontinued     Discussed health benefits of physical activity, and encouraged him to engage in regular exercise appropriate for his age and condition.    Problem List Items Addressed This Visit       Cardiovascular and Mediastinum   Essential hypertension (Chronic)    Well controlled today      CAD S/P percutaneous coronary angioplasty    Following with Dr GEinar Gip has appt next week.        Respiratory   IPF (idiopathic pulmonary fibrosis) (HCC) (Chronic)    Following with Dr. MVaughan Brownerlast seen in May LFTs have been stable.  On Ofev        Endocrine   Controlled type 2 diabetes mellitus (HCC) (Chronic)    Recent A1c Controlled without medications.        Musculoskeletal and Integument   Psoriasiform dermatitis - Primary (Chronic)    Follow Dr. KAdolph Pollackon SOrson Apeneeds QuantiFERON we will check today.      Relevant Orders   QuantiFERON-TB Gold Plus     Other   History of TIA (transient ischemic attack)    [facility] Has follow up in 3 weeks with Dr SLeonie Man On DAPT, statin, A1c and BP at goal.      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 AVicente Males  Other Visit Diagnoses     Immunosuppression due to drug therapy [facility])       Relevant Orders   QuantiFERON-TB Gold Plus       Return in about 6 months (around 03/30/2023).     [name], DO

## 2022-09-28 NOTE — Patient Instructions (Signed)
  Mr. Som[place] , Thank you for taking time to come for your Medicare Wellness Visit. I appreciate your ongoing commitment to your health goals. Please review the following plan we discussed and let me know if I can assist you in the future.   These are the goals we discussed:  Goals       Care Coordination Activiites - Initial visit (pt-stated)      Patient needs are being met.  No additional concerns or needs. No barriers or unmet needs.  No interventions required or requested.  Patient is doing well and SW services are no needed a this time.         This is a list of the screening recommended for you and due dates:  Health Maintenance  Topic Date Due   Eye exam for diabetics  Never done   Yearly kidney health urinalysis for diabetes  Never done   Tetanus Vaccine  Never done   COVID-19 Vaccine (3 - Pfizer risk series) 05/04/2020   Flu Shot  07/18/2022   Hemoglobin A1C  03/15/2023   Yearly kidney function blood test for diabetes  09/14/2023   Complete foot exam   09/29/2023   Pneumonia Vaccine  Completed   Hepatitis C Screening: USPSTF Recommendation to screen - Ages 21-79 yo.  Completed   Zoster (Shingles) Vaccine  Completed   HPV Vaccine  Aged Out   Colon Cancer Screening  Discontinued

## 2022-09-28 NOTE — Assessment & Plan Note (Signed)
Following with Dr. Vaughan Browner last seen in May LFTs have been stable.  On Ofev

## 2022-09-28 NOTE — Assessment & Plan Note (Signed)
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 Vicente Males

## 2022-09-28 NOTE — Assessment & Plan Note (Signed)
Follow Dr. Adolph Pollack on [name] needs QuantiFERON we will check today.

## 2022-09-28 NOTE — Assessment & Plan Note (Signed)
Following with Dr Einar Gip, has appt next week.

## 2022-09-28 NOTE — Assessment & Plan Note (Signed)
Well controlled today.

## 2022-09-28 NOTE — Assessment & Plan Note (Signed)
Recently seen at [facility], dx with TIA. Has follow up in 3 weeks with Dr Leonie Man. On DAPT, statin, A1c and BP at goal.

## 2022-10-01 LAB — QUANTIFERON-TB GOLD PLUS
QuantiFERON Mitogen Value: >10 IU/mL
QuantiFERON Nil Value: 0.18 IU/mL
QuantiFERON TB1 Ag Value: 0.19 IU/mL
QuantiFERON TB2 Ag Value: 0.23 IU/mL
QuantiFERON-TB Gold Plus: NEGATIVE

## 2022-10-05 ENCOUNTER — Ambulatory Visit: Payer: Medicare HMO | Admitting: Cardiology

## 2022-10-05 ENCOUNTER — Telehealth: Payer: Self-pay

## 2022-10-05 ENCOUNTER — Encounter: Payer: Self-pay | Admitting: Cardiology

## 2022-10-05 VITALS — BP 125/78 | HR 92 | Temp 97.7°F | Resp 16 | Ht 66.0 in | Wt 142.0 lb

## 2022-10-05 DIAGNOSIS — I10 Essential (primary) hypertension: Secondary | ICD-10-CM | POA: Diagnosis not present

## 2022-10-05 DIAGNOSIS — I25118 Atherosclerotic heart disease of native coronary artery with other forms of angina pectoris: Secondary | ICD-10-CM | POA: Diagnosis not present

## 2022-10-05 DIAGNOSIS — Z8673 Personal history of transient ischemic attack (TIA), and cerebral infarction without residual deficits: Secondary | ICD-10-CM

## 2022-10-05 DIAGNOSIS — J84112 Idiopathic pulmonary fibrosis: Secondary | ICD-10-CM | POA: Diagnosis not present

## 2022-10-05 DIAGNOSIS — E7800 Pure hypercholesterolemia, unspecified: Secondary | ICD-10-CM | POA: Diagnosis not present

## 2022-10-05 DIAGNOSIS — L400 Psoriasis vulgaris: Secondary | ICD-10-CM

## 2022-10-05 MED ORDER — SKYRIZI 150 MG/ML ~~LOC~~ SOSY: 150.0000 mg | PREFILLED_SYRINGE | SUBCUTANEOUS | 1 refills | Status: DC

## 2022-10-05 MED ORDER — CLOPIDOGREL BISULFATE 75 MG PO TABS: 75.0000 mg | ORAL_TABLET | Freq: Every day | ORAL | 3 refills | Status: AC

## 2022-10-05 NOTE — Telephone Encounter (Signed)
Patient's son Nischal informed of lab results. Refill sent to pharmacy.

## 2022-10-05 NOTE — Telephone Encounter (Signed)
-----   Message from Ralene Bathe, MD sent at 10/05/2022 12:52 PM EDT ----- Quantiferon gold / TB test from 09/28/2022 was  Negative / normal  Continue Skyrizi for Psoriasis Keep fu appt

## 2022-10-05 NOTE — Progress Notes (Signed)
Primary Physician/Referring:  [name], DO  Patient ID: Nicholas Fleming, male    DOB: 1946/05/21, 76 y.o.   MRN: [id number]  Chief Complaint  Patient presents with   Coronary Artery Disease   Shortness of Breath   Hypertension   Follow-up    6 month   HPI:    Nicholas Fleming  is a 76 y.o. 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 High-resolution CT revealing mild pulmonary artery dilatation, aortic atherosclerosis in addition to left main and three-vessel coronary artery calcification.   Patient presented to the emergency room on 09/13/2022 with right-sided weakness, was evaluated and ruled out for stroke, MRI did not reveal any significant changes of acute stroke.  Symptoms resolved with no further residual deficits, diagnosed with TIA and discharged home on continued aspirin and Plavix was added.  He now presents for follow-up.  His dyspnea has remained stable.  No chest pain, palpitations, PND or orthopnea.  Denies leg edema.   He has not had any recurrence of TIA-like symptoms.  Past Medical History:  Diagnosis Date   Allergy    Asthma    Bronchitis    Controlled type 2 diabetes mellitus ([place]) 10/09/2019   Essential hypertension 10/09/2019   Hypothyroidism 10/09/2019   Psoriasis    Social History   Tobacco Use   Smoking status: Former    Packs/day: 0.25    Years: 1.00    Total pack years: 0.25    Types: Cigarettes    Quit date: 1964    Years since quitting: 59.8   Smokeless tobacco: Never  Substance Use Topics   Alcohol use: Yes    Comment: 1 to2 times per week   Marital Status: Married   ROS  Review of Systems  Cardiovascular:  Positive for dyspnea on exertion. Negative for chest pain and leg swelling.     Objective  Blood pressure 125/78, pulse 92, temperature 97.7 F (36.5 C), temperature source Temporal, resp. rate 16, height 5' 6"  (1.676 m), weight 142 lb (64.4 kg), SpO2 95 %.     10/05/2022   11:14 AM 09/28/2022   11:15 AM 09/14/2022    1:34 PM  Vitals with BMI  Height 5' 6"  5' 6"    Weight 142 lbs 145 lbs 6 oz   BMI 89.37 34.28   Systolic 768 115 726  Diastolic 78 75 75  Pulse 92 94      Physical Exam Neck:     Vascular: No carotid bruit or JVD.  Cardiovascular:     Rate and Rhythm: Normal rate and regular rhythm.     Pulses: Normal pulses and intact distal pulses.     Heart sounds: S1 normal and S2 normal. No murmur heard.    No gallop.  Pulmonary:     Effort: Pulmonary effort is normal.     Breath sounds: Examination of the right-lower field reveals rales. Examination of the left-lower field reveals rales. Rales (Bilateral basal coarse) present.  Abdominal:     General: Bowel sounds are normal.     Palpations: Abdomen is soft.  Musculoskeletal:     Right lower leg: No edema.     Left lower leg: No edema.    Laboratory examination:   Lab Results  Component Value Date  NA 137 09/13/2022   K 4.0 09/13/2022   CO2 26 09/13/2022   GLUCOSE 124 (H) 09/13/2022   BUN 11 09/13/2022   CREATININE 0.96 09/13/2022   CALCIUM 8.6 (L) 09/13/2022   EGFR 90 05/04/2022   GFRNONAA >60 09/13/2022       Latest Ref Rng & Units 09/13/2022    4:27 PM 05/04/2022   11:36 AM 08/25/2021   12:04 PM  CMP  Glucose 70 - 99 mg/dL 124  123  117   BUN 8 - 23 mg/dL 11  7  11    Creatinine 0.61 - 1.24 mg/dL 0.96  0.85  0.77   Sodium 135 - 145 mmol/L 137  137  136   Potassium 3.5 - 5.1 mmol/L 4.0  4.7  4.2   Chloride 98 - 111 mmol/L 103  97  102   CO2 22 - 32 mmol/L 26  23  21    Calcium 8.9 - 10.3 mg/dL 8.6  9.3  8.9   Total Protein 6.5 - 8.1 g/dL 7.5  6.2  6.0   Total Bilirubin 0.3 - 1.2 mg/dL 0.9  0.4  0.4   Alkaline Phos 38 - 126 U/L 44  53  46   AST 15 - 41 U/L 32  22  21   ALT 0  - 44 U/L 20  13  14        Latest Ref Rng & Units 09/13/2022    4:27 PM 05/04/2022   11:36 AM 04/07/2021   11:14 AM  CBC  WBC 4.0 - 10.5 K/uL 14.1  8.9  12.3   Hemoglobin 13.0 - 17.0 g/dL 15.9  16.1  16.5   Hematocrit 39.0 - 52.0 % 49.4  47.0  48.8   Platelets 150 - 400 K/uL 214  230  232    Lab Results  Component Value Date   CHOL 97 09/14/2022   HDL 39 (L) 09/14/2022   LDLCALC 47 09/14/2022   TRIG 54 09/14/2022   CHOLHDL 2.5 09/14/2022     HEMOGLOBIN A1C Lab Results  Component Value Date   HGBA1C 5.9 (H) 09/14/2022   MPG 122.63 09/14/2022   TSH Recent Labs    05/04/22 1136  TSH 4.110   BNP    Component Value Date/Time   BNP 82.7 09/13/2022 1627   Allergies  No Known Allergies  Medications    Current Outpatient Medications:    albuterol (PROVENTIL HFA) 108 (90 Base) MCG/ACT inhaler, Inhale 1-2 puffs into the lungs every 6 (six) hours as needed for wheezing or shortness of breath., Disp: 18 g, Rfl: 3   budesonide-formoterol (SYMBICORT) 80-4.5 MCG/ACT inhaler, Inhale 2 puffs into the lungs 2 (two) times daily., Disp: 30.6 g, Rfl: 3   cholecalciferol (VITAMIN D3) 25 MCG (1000 UNIT) tablet, Take 1,000 Units by mouth daily., Disp: , Rfl:    ketoconazole (NIZORAL) 2 % cream, Apply 1 application. topically daily. Qhs to feet, Disp: 60 g, Rfl: 3   levothyroxine (SYNTHROID) 50 MCG tablet, Take 1 tablet (50 mcg total) by mouth daily., Disp: 90 tablet, Rfl: 3   metoprolol succinate (TOPROL-XL) 50 MG 24 hr tablet, Take 1 tablet (50 mg total) by mouth daily. Take with or immediately following a meal., Disp: 90 tablet, Rfl: 3   Nintedanib (OFEV) 150 MG CAPS, Take 1 capsule (150 mg total) by mouth 2 (two) times daily., Disp: 180 capsule, Rfl: 1   pantoprazole (PROTONIX) 40 MG tablet, TAKE ONE TABLET BY MOUTH DAILY, Disp: 90 tablet, Rfl: 3  rifaximin (XIFAXAN) 550 MG TABS tablet, Take 1 tablet (550 mg total) by mouth 3 (three) times daily for 14 days., Disp: 42 tablet, Rfl: 0    rosuvastatin (CRESTOR) 20 MG tablet, Take 1 tablet (20 mg total) by mouth daily., Disp: 90 tablet, Rfl: 3   clopidogrel (PLAVIX) 75 MG tablet, Take 1 tablet (75 mg total) by mouth daily., Disp: 90 tablet, Rfl: 3   Risankizumab-rzaa (SKYRIZI) 150 MG/ML SOSY, Inject 150 mg into the skin as directed. Every 12 weeks for maintenance., Disp: 1 mL, Rfl: 1   Radiology:   High-resolution CT chest 03/01/2022: 1. Lower lung and subpleural predominant fibrotic changes with no evidence of progression when compared with prior exam 03/29/2021. Findings are categorized as probable UIP per consensus guidelines: Diagnosis of Idiopathic Pulmonary Fibrosis: An Official ATS/ERS/JRS/ALAT Clinical Practice Guideline. [place], Iss 5, [phone], Aug 18 2017. 2. Cholelithiasis 3. Cardiovascular: Cardiomegaly. No pericardial effusion. Atherosclerotic disease of the thoracic aorta. Dilated main pulmonary artery, measuring up to 3.6 cm, unchanged grade to prior exam. Left main and three-vessel coronary artery calcifications.  Cardiac Studies:   Exercise nuclear stress test 07/25/2021: Normal ECG stress. The patient exercised for 2 minutes and 21 seconds of a Bruce protocol, achieving approximately 4.64 METs. Terhe were occasional PVC and V-couplets during peak exercise. No chest pain. Symptoms included dyspnea. Normal BP response.  Myocardial perfusion is abnormal. There is a very small, mild reversible mild defect in the inferior region. TID is abnormal at 1.21.   Overall LV systolic function is abnormal without regional wall motion abnormalities. Stress LV EF: 46%.  No previous exam available for comparison. Intermediate risk study.   Echocardiogram 09/13/2022:   1. Left ventricular ejection fraction, by estimation, is 55 to 60%. The left ventricle has normal function. The left ventricle has no regional wall motion abnormalities. Left ventricular diastolic parameters are consistent with Grade I  diastolic  dysfunction (impaired relaxation).  2. Right ventricular systolic function is normal. The right ventricular size is normal. Tricuspid regurgitation signal is inadequate for assessing PA pressure.  3. Left atrial size was moderately dilated.  4. The mitral valve is normal in structure. Mild to moderate mitral valve regurgitation. No evidence of mitral stenosis.  5. The aortic valve is normal in structure. Aortic valve regurgitation is not visualized. Aortic valve sclerosis/calcification is present, without any evidence of aortic stenosis.  6. The inferior vena cava is normal in size with greater than 50% respiratory variability, suggesting right atrial pressure of 3 mmHg.  7. Agitated saline contrast bubble study was negative, with no evidence of any interatrial shunt  EKG:   EKG 10/05/2022: Normal sinus rhythm with rate of 88 bpm, normal axis, poor R progression, cannot exclude anteroseptal infarct old.  Frequent PVCs, (3).  No evidence of ischemia.  Compared to 03/06/2022, PVCs  Assessment     ICD-10-CM   1. Coronary artery disease involving native coronary artery of native heart with other form of angina pectoris ([place])  I25.118 EKG 12-Lead    2. History of TIA (transient ischemic attack)  Z86.73 clopidogrel (PLAVIX) 75 MG tablet    3. Essential hypertension  I10     4. Hypercholesterolemia  E78.00     5. IPF (idiopathic pulmonary fibrosis) (HCC)  J84.112        Medications Discontinued During This Encounter  Medication Reason   clopidogrel (PLAVIX) 75 MG tablet      Meds ordered this encounter  Medications  clopidogrel (PLAVIX) 75 MG tablet    Sig: Take 1 tablet (75 mg total) by mouth daily.    Dispense:  90 tablet    Refill:  3    Please consider filling the prescription for a total of 6 months, patient is traveling abroad soon   Recommendations:   Nicholas Fleming is a 76 y.o. 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 High-resolution CT revealing mild pulmonary artery dilatation, aortic atherosclerosis in addition to left main and three-vessel coronary artery calcification.   Patient presented to the emergency room on 09/13/2022 with right-sided weakness, was evaluated and ruled out for stroke, MRI did not reveal any significant changes of acute stroke.  Symptoms resolved with no further residual deficits, diagnosed with TIA and discharged home on continued aspirin and Plavix was added.  He now presents for follow-up.  I reviewed his imaging studies, his EKG, echocardiogram.  Suspect this was a TIA.  Could consider this as aspirin failure and I will continue Plavix indefinitely and discontinue aspirin.  Otherwise he is presently on guideline directed medical therapy for CAD including telmisartan, rosuvastatin, metoprolol succinate. His dyspnea has remained stable, he continues to do as much physical activity as possible.  Overall although stress test was positive for ischemia, overall considered to be low risk.  Unless he has worsening dyspnea, develops chest pain or heart failure symptoms, then could proceed with cardiac catheterization.  He has been evaluated by pulmonary medicine, ILD has remained stable as well.  Patient also prefers medical therapy.  Although he has pulmonary artery dilatation, echocardiogram does not reveal any RV strain, IVC is normal in size with normal respiratory variation.  And suspicion for significant pulmonary hypertension is low.  Office visit in 6 months.      Adrian Prows, MD, [facility] 10/07/2022, 10:25 AM Office: [phone] Fax: [phone] Pager: [phone]

## 2022-10-18 ENCOUNTER — Ambulatory Visit: Payer: Medicare HMO | Admitting: Neurology

## 2022-10-18 ENCOUNTER — Encounter: Payer: Self-pay | Admitting: Neurology

## 2022-10-18 VITALS — BP 133/86 | HR 82 | Ht 66.0 in | Wt 142.0 lb

## 2022-10-18 DIAGNOSIS — R2689 Other abnormalities of gait and mobility: Secondary | ICD-10-CM | POA: Diagnosis not present

## 2022-10-18 DIAGNOSIS — I700 Atherosclerosis of aorta: Secondary | ICD-10-CM | POA: Diagnosis not present

## 2022-10-18 DIAGNOSIS — G459 Transient cerebral ischemic attack, unspecified: Secondary | ICD-10-CM | POA: Diagnosis not present

## 2022-10-18 DIAGNOSIS — R42 Dizziness and giddiness: Secondary | ICD-10-CM | POA: Diagnosis not present

## 2022-10-18 NOTE — Patient Instructions (Signed)
I had a long d/w patient and his daughter inlaw about his recent TIA, risk for recurrent stroke/TIAs, personally independently reviewed imaging studies and stroke evaluation results and answered questions.Continue Plavix 75 mg daily  for secondary stroke prevention and maintain strict control of hypertension with blood pressure goal below 130/90, diabetes with hemoglobin A1c goal below 6.5% and lipids with LDL cholesterol goal below 70 mg/dL. I also advised the patient to eat a healthy diet with plenty of whole grains, cereals, fruits and vegetables, exercise regularly and maintain ideal body weight . 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  Stroke Prevention Some medical conditions and behaviors can lead to a higher chance of having a stroke. You can help prevent a stroke by eating healthy, exercising, not smoking, and managing any medical conditions you have. Stroke is a leading cause of functional impairment. Primary prevention is particularly important because a majority of strokes are first-time events. Stroke changes the lives of not only those who experience a stroke but also their family and other caregivers. How can this condition affect me? A stroke is a medical emergency and should be treated right away. A stroke can lead to brain damage and can sometimes be life-threatening. If a person gets medical treatment right away, there is a better chance of surviving and recovering from a stroke. What can increase my risk? The following medical conditions may increase your risk of a stroke: Cardiovascular disease. High blood pressure (hypertension). Diabetes. High cholesterol. Sickle cell disease. Blood clotting disorders (hypercoagulable state). Obesity. Sleep disorders (obstructive sleep apnea). Other risk factors include: Being older  than age 46. Having a history of blood clots, stroke, or mini-stroke (transient ischemic attack, TIA). Genetic factors, such as race, ethnicity, or a family history of stroke. Smoking cigarettes or using other tobacco products. Taking birth control pills, especially if you also use tobacco. Heavy use of alcohol or drugs, especially cocaine and methamphetamine. Physical inactivity. What actions can I take to prevent this? Manage your health conditions High cholesterol levels. Eating a healthy diet is important for preventing high cholesterol. If cholesterol cannot be managed through diet alone, you may need to take medicines. Take any prescribed medicines to control your cholesterol as told by your health care provider. Hypertension. To reduce your risk of stroke, try to keep your blood pressure below 130/80. Eating a healthy diet and exercising regularly are important for controlling blood pressure. If these steps are not enough to manage your blood pressure, you may need to take medicines. Take any prescribed medicines to control hypertension as told by your health care provider. Ask your health care provider if you should monitor your blood pressure at home. Have your blood pressure checked every year, even if your blood pressure is normal. Blood pressure increases with age and some medical conditions. Diabetes. Eating a healthy diet and exercising regularly are important parts of managing your blood sugar (glucose). If your blood sugar cannot be managed through diet and exercise, you may need to take medicines. Take any prescribed medicines to control your diabetes as told by your health care provider. Get evaluated for obstructive sleep apnea. Talk to your health care provider about getting a sleep evaluation if you snore a lot or have excessive sleepiness. Make sure that any other medical conditions you have, such as atrial fibrillation or atherosclerosis, are managed. Nutrition  Follow  instructions from your health care provider about what to eat or drink to help manage your health condition. These instructions may include: Reducing your daily calorie intake. Limiting how much salt (sodium) you use to 1,500 milligrams (mg) each day. Using only healthy fats for cooking, such as olive oil, canola oil, or sunflower oil. Eating healthy foods. You can do this by: Choosing foods that are high in fiber, such as whole grains, and fresh fruits and vegetables. Eating at least 5 servings of fruits and vegetables a day. Try to fill one-half of your plate with fruits and vegetables at each meal. Choosing lean protein foods, such as lean cuts of meat, poultry without skin, fish, tofu, beans, and nuts. Eating low-fat dairy products. Avoiding foods that are high in sodium. This can help lower blood pressure. Avoiding foods that have saturated fat, trans fat, and cholesterol. This can help prevent high cholesterol. Avoiding processed and prepared foods. Counting your daily carbohydrate intake.  Lifestyle If you drink alcohol: Limit how much you have to: 0-1 drink a day for women who are not pregnant. 0-2 drinks a day for men. Know how much alcohol is in your drink. In the [place] one drink equals one 12 oz bottle of beer (338m), one 5 oz glass of wine (1479m, or one 1 oz glass of hard liquor (4446m Do not use any products that contain nicotine or tobacco. These products include cigarettes, chewing tobacco, and vaping devices, such as e-cigarettes. If you need help quitting, ask your health care provider. Avoid secondhand smoke. Do not use drugs. Activity  Try to stay at a healthy weight. Get at least 30 minutes of exercise on most days, such as: Fast walking. Biking. Swimming. Medicines Take over-the-counter and prescription medicines only as told by your health care provider. Aspirin or blood thinners (antiplatelets or anticoagulants) may be recommended to reduce your risk of  forming blood clots that can lead to stroke. Avoid taking birth control pills. Talk to your health care provider about the risks of taking birth control pills if: You are over 35 62ars old. You smoke. You get very bad headaches. You have had a blood clot. Where to find more information American Stroke Association: www.strokeassociation.org Get help right away if: You or a loved one has any symptoms of a stroke. "BE FAST" is an easy way to remember the main warning signs of a stroke: B - Balance. Signs are dizziness, sudden trouble walking, or loss of balance. E - Eyes. Signs are trouble seeing or a sudden change in vision. F - Face. Signs are sudden weakness or numbness of the face, or the face or eyelid drooping on one side. A - Arms. Signs are weakness or numbness in an arm. This happens suddenly and usually on one side of the body. S - Speech. Signs are sudden trouble speaking, slurred speech, or trouble understanding what people say. T - Time. Time to call emergency services. Write down what time symptoms started. You or a loved one has other signs of a stroke, such as: A sudden, severe headache with no known cause. Nausea or vomiting. Seizure. These symptoms may represent a serious problem that is an emergency. Do not wait to see if the symptoms will go away. Get medical help right away. Call your local emergency services (911 in the [place] Do not drive yourself to the hospital. Summary You can help to prevent a stroke by eating healthy, exercising, not smoking, limiting alcohol intake, and managing any  medical conditions you may have. Do not use any products that contain nicotine or tobacco. These include cigarettes, chewing tobacco, and vaping devices, such as e-cigarettes. If you need help quitting, ask your health care provider. Remember "BE FAST" for warning signs of a stroke. Get help right away if you or a loved one has any of these signs. This information is not intended to  replace advice given to you by your health care provider. Make sure you discuss any questions you have with your health care provider. Document Revised: 07/05/2020 Document Reviewed: 07/05/2020 Elsevier Patient Education  [place].

## 2022-10-18 NOTE — Progress Notes (Signed)
[facility] [address] [place]. [place] [address] [phone]       OFFICE CONSULT NOTE  [name] Date of Birth:  [birth year] Medical Record Number:  [id number]   Referring MD: Dr. Dareen Piano  Reason for Referral: TIA  HPI: Nicholas Fleming is a pleasant 76 year old [place] male seen for initial office consultation visit for TIA.  He is accompanied by his daughter-in-law.  History is obtained from them and review of electronic medical records.  I personally reviewed pertinent available imaging films in PACS.  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 ER on 09/13/2022 with sudden onset of ataxia dizziness x2 and leaning to the right.  He had somewhat similar episode a year ago which lasted 30 minutes and resolved and at that time he has not sought medical help.  Patient was seen by me on telemetry neurology consultation for symptoms have resolved.  CT head was unremarkable CT angiogram showed no large vessel stenosis or occlusion.  MRI scan of the brain was obtained the next day which showed no infarct or abnormality.  Transthoracic echo showed ejection fraction of 55 to 60% without significant wall motion abnormalities.  Left atrium size was moderately dilated.  Agitated saline contrast bubble study was negative.  LDL cholesterol 47 mg percent.  Hemoglobin A1c 9.  Patient was started on dual antiplatelet therapy aspirin and Plavix for 3 weeks.  Completed continue on Plavix alone.  He states is done well.  He has had no recurrent episodes of dizziness or any other TIA or strokelike symptoms.  He is tolerating Plavix well without significant bruising or bleeding or other side effects.  He is also on tolerating well without he has no complaints today.  He plans  to travel to [place] soon to spend his winter there. ROS:   14 system review of systems is  positive for dizziness, imbalance, leaning to 1 side and nausea and all other systems negative  PMH:  Past Medical History:  Diagnosis Date   Allergy    Asthma    Bronchitis    Controlled type 2 diabetes mellitus ([place]) 10/09/2019   Essential hypertension 10/09/2019   Hypothyroidism 10/09/2019   Psoriasis     Social History:  Social History   Socioeconomic History   Marital status: Married    Spouse name: Not on file   Number of children: 2   Years of education: Not on file   Highest education level: Not on file  Occupational History   Not on file  Tobacco Use   Smoking status: Former    Packs/day: 0.25    Years: 1.00    Total pack years: 0.25    Types: Cigarettes    Quit date: 1964    Years since quitting: 59.8   Smokeless tobacco: Never  Vaping Use   Vaping Use: Never used  Substance and Sexual Activity   Alcohol use: Yes    Comment: 1 to2 times per week   Drug use: Never   Sexual activity: Not on file  Other Topics Concern   Not on file  Social History Narrative   Not on file   Social Determinants of Health   Financial Resource Strain: Not on file  Food Insecurity: No Food Insecurity (09/13/2022)   Hunger Vital Sign    Worried About Running Out of Food in the Last Year: Never  true    Ran Out of Food in the Last Year: Never true  Transportation Needs: No Transportation Needs (09/13/2022)   PRAPARE - Hydrologist (Medical): No    Lack of Transportation (Non-Medical): No  Physical Activity: Not on file  Stress: Not on file  Social Connections: Not on file  Intimate Partner Violence: Not on file    Medications:   Current Outpatient Medications on File Prior to Visit  Medication Sig Dispense Refill   albuterol (PROVENTIL HFA) 108 (90 Base) MCG/ACT inhaler Inhale 1-2 puffs into the lungs every 6 (six) hours as needed for wheezing or shortness of breath. 18 g 3   budesonide-formoterol (SYMBICORT) 80-4.5 MCG/ACT inhaler Inhale 2  puffs into the lungs 2 (two) times daily. 30.6 g 3   cholecalciferol (VITAMIN D3) 25 MCG (1000 UNIT) tablet Take 1,000 Units by mouth daily.     clopidogrel (PLAVIX) 75 MG tablet Take 1 tablet (75 mg total) by mouth daily. 90 tablet 3   ketoconazole (NIZORAL) 2 % cream Apply 1 application. topically daily. Qhs to feet 60 g 3   levothyroxine (SYNTHROID) 50 MCG tablet Take 1 tablet (50 mcg total) by mouth daily. 90 tablet 3   metoprolol succinate (TOPROL-XL) 50 MG 24 hr tablet Take 1 tablet (50 mg total) by mouth daily. Take with or immediately following a meal. 90 tablet 3   Nintedanib (OFEV) 150 MG CAPS Take 1 capsule (150 mg total) by mouth 2 (two) times daily. 180 capsule 1   pantoprazole (PROTONIX) 40 MG tablet TAKE ONE TABLET BY MOUTH DAILY 90 tablet 3   Risankizumab-rzaa (SKYRIZI) 150 MG/ML SOSY Inject 150 mg into the skin as directed. Every 12 weeks for maintenance. 1 mL 1   rosuvastatin (CRESTOR) 20 MG tablet Take 1 tablet (20 mg total) by mouth daily. 90 tablet 3   No current facility-administered medications on file prior to visit.    Allergies:  No Known Allergies  Physical Exam General: well developed, well nourished pleasant elderly [place] male, seated, in no evident distress Head: head normocephalic and atraumatic.   Neck: supple with no carotid or supraclavicular bruits Cardiovascular: regular rate and rhythm, no murmurs Musculoskeletal: no deformity Skin:  no rash/petichiae Vascular:  Normal pulses all extremities  Neurologic Exam Mental Status: Awake and fully alert. Oriented to place and time. Recent and remote memory intact. Attention span, concentration and fund of knowledge appropriate. Mood and affect appropriate.  Cranial Nerves: Fundoscopic exam reveals sharp disc margins. Pupils equal, briskly reactive to light. Extraocular movements full without nystagmus. Visual fields full to confrontation. Hearing intact. Facial sensation intact. Face, tongue, palate  moves normally and symmetrically.  Motor: Normal bulk and tone. Normal strength in all tested extremity muscles. Sensory.: intact to touch , pinprick , position and vibratory sensation.  Coordination: Rapid alternating movements normal in all extremities. Finger-to-nose and heel-to-shin performed accurately bilaterally. Gait and Station: Arises from chair without difficulty. Stance is normal. Gait demonstrates normal stride length and balance . Able to heel, toe and tandem walk without difficulty.  Reflexes: 1+ and symmetric. Toes downgoing.   NIHSS  0 Modified Rankin  0   ASSESSMENT:76 year old [place] origin male with sudden onset of dizziness, vomiting and leaning to the right on 09/13/2022 likely due to posterior circulation TIA versus small infarct not visualized on MRI.  Vascular risk factors of hyperlipidemia and hypertension only     PLAN: I had a long d/w patient and  his daughter inlaw about his recent TIA, risk for recurrent stroke/TIAs, personally independently reviewed imaging studies and stroke evaluation results and answered questions.Continue Plavix 75 mg daily  for secondary stroke prevention and maintain strict control of hypertension with blood pressure goal below 130/90, diabetes with hemoglobin A1c goal below 6.5% and lipids with LDL cholesterol goal below 70 mg/dL. I also advised the patient to eat a healthy diet with plenty of whole grains, cereals, fruits and vegetables, exercise regularly and maintain ideal body weight . 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  Greater than 50% time during this 45-minute consultation visit was spent on counseling and coordination of care about his TIA and discussion about TIA and stroke prevention and treatment and answering questions.  Antony Contras, MD  Note: This document  was prepared with digital dictation and possible smart phrase technology. Any transcriptional errors that result from this process are unintentional.

## 2022-11-30 ENCOUNTER — Telehealth: Payer: Self-pay | Admitting: Pharmacist

## 2022-11-30 NOTE — Telephone Encounter (Signed)
Submitted a Prior Authorization RENEWAL request to [facility] for OFEV via CoverMyMeds. Will update once we receive a response.  Key: [id number]  Per automated response, The patient currently has access to the requested medication and a Prior Authorization is not needed for the patient/medication.  OF note, patient has not been seen since May 2023. 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

## 2022-12-05 NOTE — Telephone Encounter (Signed)
Pt's son called back. Patient is out of the country and will return end of March/ early April- will adjust dates on reclla to follow up in a few weeks.

## 2022-12-05 NOTE — Telephone Encounter (Signed)
LMTCB

## 2022-12-20 ENCOUNTER — Encounter: Payer: Self-pay | Admitting: Neurology

## 2023-01-01 ENCOUNTER — Other Ambulatory Visit: Payer: Self-pay | Admitting: Neurology

## 2023-01-01 DIAGNOSIS — I6501 Occlusion and stenosis of right vertebral artery: Secondary | ICD-10-CM

## 2023-01-03 ENCOUNTER — Telehealth: Payer: Self-pay | Admitting: Pulmonary Disease

## 2023-01-03 DIAGNOSIS — J84112 Idiopathic pulmonary fibrosis: Secondary | ICD-10-CM

## 2023-01-03 MED ORDER — OFEV 150 MG PO CAPS: 1.0000 | ORAL_CAPSULE | Freq: Two times a day (BID) | ORAL | 0 refills | Status: DC

## 2023-01-03 NOTE — Telephone Encounter (Signed)
Refill sent for OFEV to [place] (pulmonary fibrosis team). [phone]  Dose: 150 mg twice daily  Last OV: 05/10/2022 Provider: Dr. Vaughan Browner  LFTs on 09/13/2022 wnl  Next OV: not scheduled. Routing to scheduling team  Knox Saliva, PharmD, MPH, BCPS Clinical Pharmacist (Rheumatology and Pulmonology)

## 2023-01-03 NOTE — Telephone Encounter (Signed)
Pharmacy called to request an electronic refill for the patient's Ofev '150mg'$ .  Please advise.  CB# [phone]

## 2023-01-04 NOTE — Telephone Encounter (Signed)
Recall was place by nurse in 04/2022 stating that "Pt is out of the country and will return end of March/ early April- will adjust dates to follow up in a few weeks ".

## 2023-01-17 ENCOUNTER — Other Ambulatory Visit: Payer: Self-pay | Admitting: Radiology

## 2023-01-17 DIAGNOSIS — G459 Transient cerebral ischemic attack, unspecified: Secondary | ICD-10-CM

## 2023-01-18 ENCOUNTER — Ambulatory Visit (HOSPITAL_COMMUNITY): Payer: Medicare HMO

## 2023-01-18 ENCOUNTER — Encounter (HOSPITAL_COMMUNITY): Payer: Self-pay

## 2023-01-18 ENCOUNTER — Ambulatory Visit (HOSPITAL_COMMUNITY)
Admission: RE | Admit: 2023-01-18 | Discharge: 2023-01-18 | Disposition: A | Discharge status: Home / Self Care | Payer: Medicare HMO | Source: Ambulatory Visit | Attending: Neurology | Admitting: Neurology

## 2023-01-18 ENCOUNTER — Other Ambulatory Visit: Payer: Self-pay | Admitting: Neurology

## 2023-01-18 ENCOUNTER — Other Ambulatory Visit: Payer: Self-pay

## 2023-01-18 DIAGNOSIS — E119 Type 2 diabetes mellitus without complications: Secondary | ICD-10-CM | POA: Diagnosis not present

## 2023-01-18 DIAGNOSIS — I6501 Occlusion and stenosis of right vertebral artery: Secondary | ICD-10-CM

## 2023-01-18 DIAGNOSIS — I10 Essential (primary) hypertension: Secondary | ICD-10-CM | POA: Insufficient documentation

## 2023-01-18 DIAGNOSIS — E785 Hyperlipidemia, unspecified: Secondary | ICD-10-CM | POA: Diagnosis not present

## 2023-01-18 DIAGNOSIS — Z955 Presence of coronary angioplasty implant and graft: Secondary | ICD-10-CM | POA: Insufficient documentation

## 2023-01-18 DIAGNOSIS — I2510 Atherosclerotic heart disease of native coronary artery without angina pectoris: Secondary | ICD-10-CM | POA: Insufficient documentation

## 2023-01-18 DIAGNOSIS — Z87891 Personal history of nicotine dependence: Secondary | ICD-10-CM | POA: Diagnosis not present

## 2023-01-18 DIAGNOSIS — Z7902 Long term (current) use of antithrombotics/antiplatelets: Secondary | ICD-10-CM | POA: Diagnosis not present

## 2023-01-18 DIAGNOSIS — I672 Cerebral atherosclerosis: Secondary | ICD-10-CM | POA: Diagnosis not present

## 2023-01-18 DIAGNOSIS — G459 Transient cerebral ischemic attack, unspecified: Secondary | ICD-10-CM

## 2023-01-18 HISTORY — PX: IR ANGIO INTRA EXTRACRAN SEL COM CAROTID INNOMINATE BILAT MOD SED: IMG5360

## 2023-01-18 HISTORY — PX: IR US GUIDE VASC ACCESS RIGHT: IMG2390

## 2023-01-18 HISTORY — PX: IR ANGIO VERTEBRAL SEL VERTEBRAL BILAT MOD SED: IMG5369

## 2023-01-18 LAB — BASIC METABOLIC PANEL
Anion gap: 6 (ref 5–15)
BUN: 10 mg/dL (ref 8–23)
CO2: 27 mmol/L (ref 22–32)
Calcium: 8.9 mg/dL (ref 8.9–10.3)
Chloride: 101 mmol/L (ref 98–111)
Creatinine, Ser: 0.84 mg/dL (ref 0.61–1.24)
GFR, Estimated: >60 mL/min (ref >60)
Glucose, Bld: 116 mg/dL — ABNORMAL HIGH (ref 70–99)
Potassium: 4.1 mmol/L (ref 3.5–5.1)
Sodium: 134 mmol/L — ABNORMAL LOW (ref 135–145)

## 2023-01-18 LAB — CBC
HCT: 47 % (ref 39.0–52.0)
Hemoglobin: 15.8 g/dL (ref 13.0–17.0)
MCH: 33.1 pg (ref 26.0–34.0)
MCHC: 33.6 g/dL (ref 30.0–36.0)
MCV: 98.3 fL (ref 80.0–100.0)
Platelets: 212 10*3/uL (ref 150–400)
RBC: 4.78 MIL/uL (ref 4.22–5.81)
RDW: 13.8 % (ref 11.5–15.5)
WBC: 9.4 10*3/uL (ref 4.0–10.5)
nRBC: 0 % (ref 0.0–0.2)

## 2023-01-18 LAB — PROTIME-INR
INR: 1.1 (ref 0.8–1.2)
Prothrombin Time: 13.9 seconds (ref 11.4–15.2)

## 2023-01-18 MED ORDER — HEPARIN SODIUM (PORCINE) 1000 UNIT/ML IJ SOLN
INTRAMUSCULAR | Status: AC
  Filled 2023-01-18: qty 10

## 2023-01-18 MED ORDER — FENTANYL CITRATE (PF) 100 MCG/2ML IJ SOLN
INTRAMUSCULAR | Status: AC
  Filled 2023-01-18: qty 2

## 2023-01-18 MED ORDER — FENTANYL CITRATE (PF) 100 MCG/2ML IJ SOLN
INTRAMUSCULAR | Status: AC | PRN
  Administered 2023-01-18: 25 ug via INTRAVENOUS

## 2023-01-18 MED ORDER — MIDAZOLAM HCL 2 MG/2ML IJ SOLN
INTRAMUSCULAR | Status: AC | PRN
  Administered 2023-01-18: 1 mg via INTRAVENOUS

## 2023-01-18 MED ORDER — LIDOCAINE HCL 1 % IJ SOLN
INTRAMUSCULAR | Status: AC
  Filled 2023-01-18: qty 20

## 2023-01-18 MED ORDER — HEPARIN SODIUM (PORCINE) 1000 UNIT/ML IJ SOLN
INTRAMUSCULAR | Status: AC | PRN
  Administered 2023-01-18: 2000 [IU] via INTRA_ARTERIAL

## 2023-01-18 MED ORDER — NITROGLYCERIN 1 MG/10 ML FOR IR/CATH LAB
INTRA_ARTERIAL | Status: AC | PRN
  Administered 2023-01-18 (×2): 200 ug via INTRA_ARTERIAL

## 2023-01-18 MED ORDER — NITROGLYCERIN 1 MG/10 ML FOR IR/CATH LAB
INTRA_ARTERIAL | Status: AC
  Filled 2023-01-18: qty 10

## 2023-01-18 MED ORDER — SODIUM CHLORIDE 0.9 % IV SOLN: Freq: Once | INTRAVENOUS | Status: AC

## 2023-01-18 MED ORDER — VERAPAMIL HCL 2.5 MG/ML IV SOLN
INTRAVENOUS | Status: AC | PRN
  Administered 2023-01-18: 2.5 mg via INTRAVENOUS

## 2023-01-18 MED ORDER — MIDAZOLAM HCL 2 MG/2ML IJ SOLN
INTRAMUSCULAR | Status: AC
  Filled 2023-01-18: qty 2

## 2023-01-18 MED ORDER — VERAPAMIL HCL 2.5 MG/ML IV SOLN
INTRAVENOUS | Status: AC
  Filled 2023-01-18: qty 2

## 2023-01-18 MED ORDER — SODIUM CHLORIDE 0.9 % IV SOLN: INTRAVENOUS | Status: AC

## 2023-01-18 MED ORDER — IOHEXOL 300 MG/ML  SOLN
150.0000 mL | Freq: Once | INTRAMUSCULAR | Status: AC | PRN
  Administered 2023-01-18: 75 mL via INTRA_ARTERIAL

## 2023-01-18 NOTE — Procedures (Signed)
INR.  Status post four-vessel cerebral arteriogram.  Right radial approach.  Findings.  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  Patient tolerated the procedure well.  There were no acute complications.  Arlean Hopping MD.

## 2023-01-18 NOTE — Sedation Documentation (Signed)
Patient transported to short stay. Ambulatory to restroom. Right wrist assessed TR Band in place. Site is clean, dry and intact. No drainage noted. +2 palpable radial pulse intact.

## 2023-01-18 NOTE — H&P (Signed)
Chief Complaint: Patient was seen in consultation today for Transient ischemic attack--- Cerebral arteriogram at the request of Sethi,Pramod S  Referring Physician(s): [place] Physician: Luanne Bras  Patient Status: [facility] - Out-pt  History of Present Illness: Nicholas Fleming is a 77 y.o. male   HTN; DM; HLD; CAD/stent; IPF (pulm fibrosis) Ataxia; dizziness and leaning to Rt --- to [facility] ED 08/2022  CTA 09/13/22:  IMPRESSION: No emergent large vessel occlusion or proximal hemodynamically significant stenosis.  DAPT x 3 weeks--- did well Remained asymptomatic til early Jan 2024 Another episode similar symptoms Rx DAPT Dr Leonie Man has referred for Cerebral arteriogram at this time  Denies any Neuro sxs for few weeks Scheduled today for Cerebral areteriogram  Past Medical History:  Diagnosis Date   Allergy    Asthma    Bronchitis    Controlled type 2 diabetes mellitus ([place]) 10/09/2019   Essential hypertension 10/09/2019   Hypothyroidism 10/09/2019   Psoriasis     Past Surgical History:  Procedure Laterality Date   COLONOSCOPY WITH PROPOFOL N/A 03/09/2020   Procedure: COLONOSCOPY WITH PROPOFOL;  Surgeon: [name], MD;  Location: [facility] ENDOSCOPY;  Service: Gastroenterology;  Laterality: N/A;   CORONARY STENT PLACEMENT Right    ESOPHAGOGASTRODUODENOSCOPY (EGD) WITH PROPOFOL N/A 03/09/2020   Procedure: ESOPHAGOGASTRODUODENOSCOPY (EGD) WITH PROPOFOL;  Surgeon: [name], MD;  Location: [facility] ENDOSCOPY;  Service: Gastroenterology;  Laterality: N/A;  Per Dr. Vicente Males, schedule at 7:30 am   TRANSURETHRAL RESECTION OF PROSTATE  ~2010    Allergies: Patient has no known allergies.  Medications: Prior to Admission medications   Medication Sig Start Date End Date Taking? Authorizing Provider  albuterol (PROVENTIL HFA) 108 (90 Base) MCG/ACT inhaler Inhale 1-2 puffs into the lungs every 6 (six) hours as needed for wheezing or shortness of breath.  05/16/22   [name], DO  budesonide-formoterol (SYMBICORT) 80-4.5 MCG/ACT inhaler Inhale 2 puffs into the lungs 2 (two) times daily. 06/22/22   [name], DO  cholecalciferol (VITAMIN D3) 25 MCG (1000 UNIT) tablet Take 1,000 Units by mouth daily.    [provider]  clopidogrel (PLAVIX) 75 MG tablet Take 1 tablet (75 mg total) by mouth daily. 10/05/22 10/05/23  Adrian Prows, MD  ketoconazole (NIZORAL) 2 % cream Apply 1 application. topically daily. Qhs to feet 03/23/22   Ralene Bathe, MD  levothyroxine (SYNTHROID) 50 MCG tablet Take 1 tablet (50 mcg total) by mouth daily. 05/18/22 05/18/23  [name], DO  metoprolol succinate (TOPROL-XL) 50 MG 24 hr tablet Take 1 tablet (50 mg total) by mouth daily. Take with or immediately following a meal. 05/16/22   Hoffman, Rachel Moulds, DO  Nintedanib (OFEV) 150 MG CAPS Take 1 capsule (150 mg total) by mouth 2 (two) times daily. 01/03/23   Mannam, Hart Robinsons, MD  pantoprazole (PROTONIX) 40 MG tablet TAKE ONE TABLET BY MOUTH DAILY 06/08/22   Mannam, Praveen, MD  Risankizumab-rzaa (SKYRIZI) 150 MG/ML SOSY Inject 150 mg into the skin as directed. Every 12 weeks for maintenance. 10/05/22   Ralene Bathe, MD  rosuvastatin (CRESTOR) 20 MG tablet Take 1 tablet (20 mg total) by mouth daily. 04/24/22   [name], DO     Family History  Problem Relation Age of Onset   Hypertension Mother    CAD Brother    Psoriasis Brother     Social History   Socioeconomic History   Marital status: Married    Spouse name: Not on file  Number of children: 2   Years of education: Not on file   Highest education level: Not on file  Occupational History   Not on file  Tobacco Use   Smoking status: Former    Packs/day: 0.25    Years: 1.00    Total pack years: 0.25    Types: Cigarettes    Quit date: 1964    Years since quitting: 60.1   Smokeless tobacco: Never  Vaping Use   Vaping Use: Never used  Substance and Sexual Activity   Alcohol use: Yes     Comment: 1 to2 times per week   Drug use: Never   Sexual activity: Not on file  Other Topics Concern   Not on file  Social History Narrative   Not on file   Social Determinants of Health   Financial Resource Strain: Not on file  Food Insecurity: No Food Insecurity (09/13/2022)   Hunger Vital Sign    Worried About Running Out of Food in the Last Year: Never true    Ran Out of Food in the Last Year: Never true  Transportation Needs: No Transportation Needs (09/13/2022)   PRAPARE - Hydrologist (Medical): No    Lack of Transportation (Non-Medical): No  Physical Activity: Not on file  Stress: Not on file  Social Connections: Not on file    Review of Systems: A 12 point ROS discussed and pertinent positives are indicated in the HPI above.  All other systems are negative.  Review of Systems  Constitutional:  Negative for activity change, fatigue and fever.  HENT:  Negative for tinnitus.   Eyes:  Negative for visual disturbance.  Respiratory:  Negative for cough and shortness of breath.   Cardiovascular:  Negative for chest pain.  Gastrointestinal:  Negative for abdominal pain.  Musculoskeletal:  Negative for back pain.  Neurological:  Negative for dizziness, tremors, seizures, syncope, facial asymmetry, speech difficulty, weakness, light-headedness, numbness and headaches.  Psychiatric/Behavioral:  Negative for behavioral problems and confusion.     Vital Signs: BP 137/89   Pulse 92   Temp (!) 97.3 F (36.3 C) (Temporal)   Resp 16   Ht '5\' 6"'$  (1.676 m)   Wt 135 lb (61.2 kg)   SpO2 98%   BMI 21.79 kg/m    Physical Exam Vitals reviewed.  HENT:     Mouth/Throat:     Mouth: Mucous membranes are moist.  Eyes:     Extraocular Movements: Extraocular movements intact.  Cardiovascular:     Rate and Rhythm: Normal rate and regular rhythm.     Heart sounds: Normal heart sounds.  Pulmonary:     Effort: Pulmonary effort is normal.     Breath  sounds: Normal breath sounds.  Abdominal:     Palpations: Abdomen is soft.     Tenderness: There is no abdominal tenderness.  Musculoskeletal:        General: No deformity. Normal range of motion.     Right lower leg: No edema.     Left lower leg: No edema.  Skin:    General: Skin is warm.  Neurological:     Mental Status: He is oriented to person, place, and time.  Psychiatric:        Behavior: Behavior normal.     Imaging: No results found.  Labs:  CBC: Recent Labs    05/04/22 1136 09/13/22 1627  WBC 8.9 14.1*  HGB 16.1 15.9  HCT 47.0 49.4  PLT 230  214    COAGS: Recent Labs    09/13/22 1627  INR 1.1  APTT 32    BMP: Recent Labs    05/04/22 1136 09/13/22 1627  NA 137 137  K 4.7 4.0  CL 97 103  CO2 23 26  GLUCOSE 123* 124*  BUN 7* 11  CALCIUM 9.3 8.6*  CREATININE 0.85 0.96  GFRNONAA  --  >60    LIVER FUNCTION TESTS: Recent Labs    05/04/22 1136 09/13/22 1627  BILITOT 0.4 0.9  AST 22 32  ALT 13 20  ALKPHOS 53 44  PROT 6.2 7.5  ALBUMIN 3.8 3.9    TUMOR MARKERS: No results for input(s): "AFPTM", "CEA", "CA199", "CHROMGRNA" in the last 8760 hours.  Assessment and Plan:  TIA symptoms 08/2022 and recently early Jan 2024 Scheduled for cerebral arteriogram per Dr Leonie Man Pt on DAPT now Risks and benefits of cerebral angiogram with intervention were discussed with the patient including, but not limited to bleeding, infection, vascular injury, contrast induced renal failure, stroke or even death.  This interventional procedure involves the use of X-rays and because of the nature of the planned procedure, it is possible that we will have prolonged use of X-ray fluoroscopy.  Potential radiation risks to you include (but are not limited to) the following: - A slightly elevated risk for cancer  several years later in life. This risk is typically less than 0.5% percent. This risk is low in comparison to the normal incidence of human cancer, which is  33% for women and 50% for men according to the [place]. - Radiation induced injury can include skin redness, resembling a rash, tissue breakdown / ulcers and hair loss (which can be temporary or permanent).   The likelihood of either of these occurring depends on the difficulty of the procedure and whether you are sensitive to radiation due to previous procedures, disease, or genetic conditions.   IF your procedure requires a prolonged use of radiation, you will be notified and given written instructions for further action.  It is your responsibility to monitor the irradiated area for the 2 weeks following the procedure and to notify your physician if you are concerned that you have suffered a radiation induced injury.    All of the patient's questions were answered, patient is agreeable to proceed.  Consent signed and in chart.  Thank you for this interesting consult.  I greatly enjoyed meeting Nicholas Fleming and look forward to participating in their care.  A copy of this report was sent to the requesting provider on this date.  Electronically Signed: Lavonia Drafts, PA-C 01/18/2023, 7:40 AM   I spent a total of  30 Minutes   in face to face in clinical consultation, greater than 50% of which was counseling/coordinating care for cerebral arteriogram

## 2023-01-22 ENCOUNTER — Ambulatory Visit: Payer: Medicare HMO | Admitting: Pulmonary Disease

## 2023-01-28 NOTE — Progress Notes (Signed)
I have already communicated the results of the cerebral catheter angiogram to patient's son Dr. Dareen Piano and recommend continue ongoing medical management for now

## 2023-02-05 ENCOUNTER — Ambulatory Visit: Payer: Medicare HMO | Admitting: Pulmonary Disease

## 2023-02-05 ENCOUNTER — Encounter: Payer: Self-pay | Admitting: Pulmonary Disease

## 2023-02-05 VITALS — BP 132/76 | HR 83 | Temp 97.6°F | Ht 66.0 in | Wt 142.0 lb

## 2023-02-05 DIAGNOSIS — Z5181 Encounter for therapeutic drug level monitoring: Secondary | ICD-10-CM | POA: Diagnosis not present

## 2023-02-05 DIAGNOSIS — R0602 Shortness of breath: Secondary | ICD-10-CM

## 2023-02-05 DIAGNOSIS — J84112 Idiopathic pulmonary fibrosis: Secondary | ICD-10-CM

## 2023-02-05 LAB — COMPREHENSIVE METABOLIC PANEL
ALT: 19 U/L (ref 0–53)
AST: 27 U/L (ref 0–37)
Albumin: 3.5 g/dL (ref 3.5–5.2)
Alkaline Phosphatase: 45 U/L (ref 39–117)
BUN: 14 mg/dL (ref 6–23)
CO2: 29 mEq/L (ref 19–32)
Calcium: 9 mg/dL (ref 8.4–10.5)
Chloride: 101 mEq/L (ref 96–112)
Creatinine, Ser: 0.85 mg/dL (ref 0.40–1.50)
GFR: 83.94 mL/min (ref >60.00)
Glucose, Bld: 128 mg/dL — ABNORMAL HIGH (ref 70–99)
Potassium: 3.8 mEq/L (ref 3.5–5.1)
Sodium: 137 mEq/L (ref 135–145)
Total Bilirubin: 0.5 mg/dL (ref 0.2–1.2)
Total Protein: 6.5 g/dL (ref 6.0–8.3)

## 2023-02-05 NOTE — Patient Instructions (Signed)
I am glad you are doing well with your breathing Continue the Ofev as prescribed Will check some labs today for monitoring Will order follow-up high-res CT and PFTs in 6 months and return to clinic after these tests.

## 2023-02-05 NOTE — Progress Notes (Unsigned)
Nicholas Fleming    MK:[id number]    14-Jan-1946  Primary Care Physician:Hoffman, Rachel Moulds, DO  Referring Physician: [name], DO [address]  [place],  [place] [address]   Chief complaint: Follow-up for IPF  HPI: 77 y.o. with history of angina, hypertension, diabetes, hyperlipidemia, psoriasis.  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  Cough is nonproductive in nature associated with mild dyspnea on exertion.  He has history of angina, coronary artery disease with a stent placement many years ago.  He has longstanding psoriasis presenting as rash.  He was on methotrexate for 2 years around 2013 and self discontinued due to concern for side effects.  Recently has been started on [place] with improvement in rash.  He developed COVID-19 infection at the end of December 2020 and treated with monoclonal antibody on 12/29 with improvement in symptoms. Started on Ofev at the end of January 2021.  He is tolerating it well with no GI issues. Evaluated for lung transplant in November 2022 at [facility] and deemed to early for transplant  Evaluated by GI for an unexplained 10 to 20 pound weight loss.  Underwent EGD and colonoscopy in 03/09/20 with findings of benign colon polyps.   Got treated with Paxlovid in January 2023 after exposure to COVID-19 at home Followed up with Dr. 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  No mold, hot tub, Jacuzzi, no down pillows or comforter ILD questionnaire 01/12/2020-negative Smoking history: Used to smoke socially.  Quit many  years ago Travel history: Originally from [place].  He had lived in [place] for over 30 years and now spends his time between [place] and [place] Relevant family history: No significant family history of lung disease  Interval history: Continues on [place] without any issue.   Outpatient Encounter Medications as of 02/05/2023  Medication Sig   albuterol (PROVENTIL HFA) 108 (90 Base) MCG/ACT inhaler Inhale 1-2 puffs into the lungs every 6 (six) hours as needed for wheezing or shortness of breath.   aspirin EC 81 MG tablet Take 81 mg by mouth daily. Swallow whole.   budesonide-formoterol (SYMBICORT) 80-4.5 MCG/ACT inhaler Inhale 2 puffs into the lungs 2 (two) times daily.   cholecalciferol (VITAMIN D3) 25 MCG (1000 UNIT) tablet Take 1,000 Units by mouth daily.   clopidogrel (PLAVIX) 75 MG tablet Take 1 tablet (75 mg total) by mouth daily.   ketoconazole (NIZORAL) 2 % cream Apply 1 application. topically daily. Qhs to feet   levothyroxine (SYNTHROID) 50 MCG tablet Take 1 tablet (50 mcg total) by mouth daily.   metoprolol succinate (TOPROL-XL) 50 MG 24 hr tablet Take 1 tablet (50 mg total) by mouth daily. Take with or immediately following a meal.   Nintedanib (OFEV) 150 MG CAPS Take 1 capsule (150 mg total) by mouth 2 (two) times daily.   pantoprazole (PROTONIX) 40 MG tablet TAKE ONE TABLET BY MOUTH DAILY   Risankizumab-rzaa (SKYRIZI) 150 MG/ML SOSY Inject 150 mg into the skin as directed. Every 12 weeks for maintenance.   rosuvastatin (  CRESTOR) 20 MG tablet Take 1 tablet (20 mg total) by mouth daily.   ticagrelor (BRILINTA) 90 MG TABS tablet Take 90 mg by mouth 2 (two) times daily.   No facility-administered encounter medications on file as of 02/05/2023.   Physical Exam: There were no vitals taken for this visit. Gen:      No acute distress HEENT:  EOMI, sclera anicteric Neck:     No masses; no thyromegaly Lungs:    Bibasal crackles CV:         Regular rate and rhythm; no murmurs Abd:      + bowel  sounds; soft, non-tender; no palpable masses, no distension Ext:    No edema; adequate peripheral perfusion Skin:      Warm and dry; no rash Neuro: alert and oriented x 3 Psych: normal mood and affect   Data Reviewed: Imaging: High-res CT 11/19/2019-peripheral and basal subpleural reticulation with groundglass, traction bronchiectasis.  Probable honeycombing.  5 mm lung nodule.  Probable UIP pattern  CT abdomen 03/17/2020-stable pulmonary fibrosis at the base.  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 I have reviewed the images personally  PFTs 03/18/2020 FVC 2.20 [60%], FEV1 1.65 [63%],/75, TLC 3.66 [58%], DLCO 12.0 [53%] Severe restriction, moderate-severe diffusion defect.  07/26/2021 FVC 1.48 [41%], FEV1 1.31 [50%], F/F 88, TLC 2.78 [44%], DLCO 10.37 [46%] Severe restriction, severe diffusion defect.  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  No known exposures or signs and symptoms of connective tissue disease Serologies for CTD are negative Reviewed at multidisciplinary conference on 12/30/19.  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  Continues on Ofev.  He is tolerating it well Recent hepatic panel this month is normal He would like to put off  pulmonary rehab as he remains active at home with daily exercise and walking  Cough Continue Protonix for treatment of GERD.  Use Delsym, Tessalon  Plan/Recommendations: - Continue Ofev - Protonix, Tessalon, Delsym  I discussed the assessment and treatment plan with the patient. The patient was provided an opportunity to ask questions and all were answered. The patient agreed with the plan and demonstrated an understanding of the instructions.   The patient was advised to call back or seek an in-person evaluation if the symptoms worsen or if the condition fails to improve as anticipated.  I provided 35 minutes of non-face-to-face time during this encounter.  Marshell Garfinkel MD [facility] 02/05/2023, 8:39 AM  CC: Nicholas Groves, DO

## 2023-02-06 LAB — PRO B NATRIURETIC PEPTIDE: NT-Pro BNP: 111 pg/mL (ref 0–486)

## 2023-02-07 NOTE — Addendum Note (Signed)
Addended by: Elton Sin on: 02/07/2023 05:00 PM   Modules accepted: Orders

## 2023-02-22 ENCOUNTER — Other Ambulatory Visit: Payer: Self-pay | Admitting: Pharmacist

## 2023-02-22 DIAGNOSIS — J84112 Idiopathic pulmonary fibrosis: Secondary | ICD-10-CM

## 2023-02-22 MED ORDER — OFEV 150 MG PO CAPS: 1.0000 | ORAL_CAPSULE | Freq: Two times a day (BID) | ORAL | 1 refills | Status: DC

## 2023-02-22 NOTE — Telephone Encounter (Signed)
Refill sent for OFEV to [facility] (pulmonary fibrosis team). [phone]  Dose: '150mg'$  twice daily  Last OV: 02/05/23 Provider: Dr. Vaughan Browner  Next OV: not yet scheduled due in 6 monts  LFTs on 02/05/23 wnl   Knox Saliva, PharmD, MPH, BCPS Clinical Pharmacist (Rheumatology and Pulmonology)

## 2023-03-15 ENCOUNTER — Telehealth: Payer: Self-pay | Admitting: Pulmonary Disease

## 2023-03-15 DIAGNOSIS — H903 Sensorineural hearing loss, bilateral: Secondary | ICD-10-CM | POA: Diagnosis not present

## 2023-03-15 NOTE — Telephone Encounter (Signed)
Message received from Dr. Vaughan Browner about patient HRCT not being scheduled.  HRCT was ordered 02/07/23 for next available, but keep follow up 07/2023.  Message routed to [facility] to assist in scheduling HRCT

## 2023-03-15 NOTE — Telephone Encounter (Signed)
I have got this scheduled sorry I miss read the order I will call patient

## 2023-03-16 DIAGNOSIS — H903 Sensorineural hearing loss, bilateral: Secondary | ICD-10-CM | POA: Diagnosis not present

## 2023-03-23 DIAGNOSIS — H903 Sensorineural hearing loss, bilateral: Secondary | ICD-10-CM | POA: Diagnosis not present

## 2023-03-27 ENCOUNTER — Ambulatory Visit
Admission: RE | Admit: 2023-03-27 | Discharge: 2023-03-27 | Disposition: A | Discharge status: Home / Self Care | Payer: Medicare HMO | Source: Ambulatory Visit | Attending: Pulmonary Disease | Admitting: Pulmonary Disease

## 2023-03-27 DIAGNOSIS — J84112 Idiopathic pulmonary fibrosis: Secondary | ICD-10-CM

## 2023-03-27 DIAGNOSIS — J479 Bronchiectasis, uncomplicated: Secondary | ICD-10-CM | POA: Diagnosis not present

## 2023-03-29 ENCOUNTER — Encounter (HOSPITAL_BASED_OUTPATIENT_CLINIC_OR_DEPARTMENT_OTHER): Payer: Medicare HMO

## 2023-03-29 ENCOUNTER — Ambulatory Visit: Payer: Medicare HMO | Admitting: Dermatology

## 2023-03-29 VITALS — BP 135/83

## 2023-03-29 DIAGNOSIS — L409 Psoriasis, unspecified: Secondary | ICD-10-CM | POA: Diagnosis not present

## 2023-03-29 DIAGNOSIS — Z79899 Other long term (current) drug therapy: Secondary | ICD-10-CM

## 2023-03-29 DIAGNOSIS — Z7189 Other specified counseling: Secondary | ICD-10-CM | POA: Diagnosis not present

## 2023-03-29 DIAGNOSIS — L810 Postinflammatory hyperpigmentation: Secondary | ICD-10-CM | POA: Diagnosis not present

## 2023-03-29 NOTE — Progress Notes (Signed)
   Follow-Up Visit   Subjective  Nicholas Fleming is a 77 y.o. male who presents for the following: Psoriasis 6 month follow up - Skyrizi injections every 3 months  Accompanied by daughter in law  The following portions of the chart were reviewed this encounter and updated as appropriate: medications, allergies, medical history  Review of Systems:  No other skin or systemic complaints except as noted in HPI or Assessment and Plan.  Objective  Well appearing patient in no apparent distress; mood and affect are within normal limits.  Areas Examined: arms and legs  Relevant exam findings are noted in the Assessment and Plan.  Assessment & Plan    PSORIASIS with secondary PIPA Hyperpigmented macules in areas of previous psoriasis. 0% BSA while on systemic Skyrizi treatment.  Wellcontrolled on Skyrizi injections  Patient denies joint pain  Treatment Plan: Continue Skyrizi injections every 3 months.  Will plan labs on follow up.  Counseling on psoriasis and coordination of care  psoriasis is a chronic non-curable, but treatable genetic/hereditary disease that may have other systemic features affecting other organ systems such as joints (Psoriatic Arthritis). It is associated with an increased risk of inflammatory bowel disease, heart disease, non-alcoholic fatty liver disease, and depression.  Treatments include light and laser treatments; topical medications; and systemic medications including oral and injectables.  Long term medication management.  Patient is using long term (months to years) prescription medication  to control their dermatologic condition.  These medications require periodic monitoring to evaluate for efficacy and side effects and may require periodic laboratory monitoring.  Return in about 6 months (around 09/28/2023) for Psoriasis.  I, Joanie Coddington, CMA, am acting as scribe for Armida Sans, MD .  Documentation: I have reviewed the above documentation for  accuracy and completeness, and I agree with the above.  Armida Sans, MD\

## 2023-03-29 NOTE — Patient Instructions (Signed)
Due to recent changes in healthcare laws, you may see results of your pathology and/or laboratory studies on MyChart before the doctors have had a chance to review them. We understand that in some cases there may be results that are confusing or concerning to you. Please understand that not all results are received at the same time and often the doctors may need to interpret multiple results in order to provide you with the best plan of care or course of treatment. Therefore, we ask that you please give us 2 business days to thoroughly review all your results before contacting the office for clarification. Should we see a critical lab result, you will be contacted sooner.   If You Need Anything After Your Visit  If you have any questions or concerns for your doctor, please call our main line at [phone] and press option 4 to reach your doctor's medical assistant. If no one answers, please leave a voicemail as directed and we will return your call as soon as possible. Messages left after 4 pm will be answered the following business day.   You may also send us a message via MyChart. We typically respond to MyChart messages within 1-2 business days.  For prescription refills, please ask your pharmacy to contact our office. Our fax number is [phone].  If you have an urgent issue when the clinic is closed that cannot wait until the next business day, you can page your doctor at the number below.    Please note that while we do our best to be available for urgent issues outside of office hours, we are not available 24/7.   If you have an urgent issue and are unable to reach us, you may choose to seek medical care at your doctor's office, retail clinic, urgent care center, or emergency room.  If you have a medical emergency, please immediately call 911 or go to the emergency department.  Pager Numbers  - Dr. Kowalski: [phone]  - Dr. Moye: [phone]  - Dr. Stewart:  [phone]  In the event of inclement weather, please call our main line at [phone] for an update on the status of any delays or closures.  Dermatology Medication Tips: Please keep the boxes that topical medications come in in order to help keep track of the instructions about where and how to use these. Pharmacies typically print the medication instructions only on the boxes and not directly on the medication tubes.   If your medication is too expensive, please contact our office at [phone] option 4 or send us a message through MyChart.   We are unable to tell what your co-pay for medications will be in advance as this is different depending on your insurance coverage. However, we may be able to find a substitute medication at lower cost or fill out paperwork to get insurance to cover a needed medication.   If a prior authorization is required to get your medication covered by your insurance company, please allow us 1-2 business days to complete this process.  Drug prices often vary depending on where the prescription is filled and some pharmacies may offer cheaper prices.  The website www.goodrx.com contains coupons for medications through different pharmacies. The prices here do not account for what the cost may be with help from insurance (it may be cheaper with your insurance), but the website can give you the price if you did not use any insurance.  - You can print the associated coupon and take it with   your prescription to the pharmacy.  - You may also stop by our office during regular business hours and pick up a GoodRx coupon card.  - If you need your prescription sent electronically to a different pharmacy, notify our office through [place] MyChart or by phone at [phone] option 4.     Si Usted Necesita Algo Despus de Su Visita  Tambin puede enviarnos un mensaje a travs de MyChart. Por lo general respondemos a los mensajes de MyChart en el transcurso de 1 a 2  das hbiles.  Para renovar recetas, por favor pida a su farmacia que se ponga en contacto con nuestra oficina. Nuestro nmero de fax es el [phone].  Si tiene un asunto urgente cuando la clnica est cerrada y que no puede esperar hasta el siguiente da hbil, puede llamar/localizar a su doctor(a) al nmero que aparece a continuacin.   Por favor, tenga en cuenta que aunque hacemos todo lo posible para estar disponibles para asuntos urgentes fuera del horario de oficina, no estamos disponibles las 24 horas del da, los 7 das de la semana.   Si tiene un problema urgente y no puede comunicarse con nosotros, puede optar por buscar atencin mdica  en el consultorio de su doctor(a), en una clnica privada, en un centro de atencin urgente o en una sala de emergencias.  Si tiene una emergencia mdica, por favor llame inmediatamente al 911 o vaya a la sala de emergencias.  Nmeros de bper  - Dr. Kowalski: [phone]  - Dra. Moye: [phone]  - Dra. Stewart: [phone]  En caso de inclemencias del tiempo, por favor llame a nuestra lnea principal al [phone] para una actualizacin sobre el estado de cualquier retraso o cierre.  Consejos para la medicacin en dermatologa: Por favor, guarde las cajas en las que vienen los medicamentos de uso tpico para ayudarle a seguir las instrucciones sobre dnde y cmo usarlos. Las farmacias generalmente imprimen las instrucciones del medicamento slo en las cajas y no directamente en los tubos del medicamento.   Si su medicamento es muy caro, por favor, pngase en contacto con nuestra oficina llamando al [phone] y presione la opcin 4 o envenos un mensaje a travs de MyChart.   No podemos decirle cul ser su copago por los medicamentos por adelantado ya que esto es diferente dependiendo de la cobertura de su seguro. Sin embargo, es posible que podamos encontrar un medicamento sustituto a menor costo o llenar un formulario para que el  seguro cubra el medicamento que se considera necesario.   Si se requiere una autorizacin previa para que su compaa de seguros cubra su medicamento, por favor permtanos de 1 a 2 das hbiles para completar este proceso.  Los precios de los medicamentos varan con frecuencia dependiendo del lugar de dnde se surte la receta y alguna farmacias pueden ofrecer precios ms baratos.  El sitio web www.goodrx.com tiene cupones para medicamentos de diferentes farmacias. Los precios aqu no tienen en cuenta lo que podra costar con la ayuda del seguro (puede ser ms barato con su seguro), pero el sitio web puede darle el precio si no utiliz ningn seguro.  - Puede imprimir el cupn correspondiente y llevarlo con su receta a la farmacia.  - Tambin puede pasar por nuestra oficina durante el horario de atencin regular y recoger una tarjeta de cupones de GoodRx.  - Si necesita que su receta se enve electrnicamente a una farmacia diferente, informe a nuestra oficina a travs de MyChart de [place]   o por telfono llamando al [phone] y presione la opcin 4.  

## 2023-03-30 ENCOUNTER — Ambulatory Visit (INDEPENDENT_AMBULATORY_CARE_PROVIDER_SITE_OTHER): Payer: Medicare HMO | Admitting: Pulmonary Disease

## 2023-03-30 DIAGNOSIS — J84112 Idiopathic pulmonary fibrosis: Secondary | ICD-10-CM | POA: Diagnosis not present

## 2023-03-30 LAB — PULMONARY FUNCTION TEST
DL/VA % pred: 39 %
DL/VA: 1.59 ml/min/mmHg/L
DLCO cor % pred: 29 %
DLCO cor: 6.56 ml/min/mmHg
DLCO unc % pred: 29 %
DLCO unc: 6.56 ml/min/mmHg
FEF 25-75 Pre: 1.95 L/sec
FEF2575-%Pred-Pre: 110 %
FEV1-%Pred-Pre: 65 %
FEV1-Pre: 1.63 L
FEV1FVC-%Pred-Pre: 122 %
FEV6-%Pred-Pre: 56 %
FEV6-Pre: 1.85 L
FEV6FVC-%Pred-Pre: 107 %
FVC-%Pred-Pre: 52 %
FVC-Pre: 1.85 L
Pre FEV1/FVC ratio: 88 %
Pre FEV6/FVC Ratio: 100 %
RV % pred: 47 %
RV: 1.12 L
TLC % pred: 46 %
TLC: 2.93 L

## 2023-03-30 NOTE — Progress Notes (Signed)
Full PFT performed today. Patient had a difficult  time with all of the tests.

## 2023-03-30 NOTE — Patient Instructions (Signed)
Full PFT performed today. Patient had a difficult  time with all of the tests. 

## 2023-04-15 ENCOUNTER — Encounter: Payer: Self-pay | Admitting: Dermatology

## 2023-04-16 ENCOUNTER — Other Ambulatory Visit: Payer: Self-pay

## 2023-04-16 MED ORDER — ROSUVASTATIN CALCIUM 20 MG PO TABS: 20.0000 mg | ORAL_TABLET | Freq: Every day | ORAL | 3 refills | Status: DC

## 2023-04-26 ENCOUNTER — Other Ambulatory Visit: Payer: Medicare HMO

## 2023-05-02 ENCOUNTER — Telehealth: Payer: Self-pay | Admitting: Internal Medicine

## 2023-05-02 NOTE — Telephone Encounter (Signed)
Contacted Nicholas Fleming to schedule their annual wellness visit. Patient declined to schedule AWV at this time. Son states that he is a physician at [facility] and will schedule it when he want to.   Nicholas Fleming [facility] Care Guide [facility] 2 AWV TEAM Direct Dial: [phone]

## 2023-05-04 ENCOUNTER — Ambulatory Visit: Payer: Medicare HMO

## 2023-05-04 DIAGNOSIS — R0609 Other forms of dyspnea: Secondary | ICD-10-CM

## 2023-05-04 DIAGNOSIS — J84112 Idiopathic pulmonary fibrosis: Secondary | ICD-10-CM | POA: Diagnosis not present

## 2023-05-07 ENCOUNTER — Ambulatory Visit: Payer: Medicare HMO | Admitting: Cardiology

## 2023-05-11 ENCOUNTER — Telehealth: Payer: Self-pay | Admitting: Internal Medicine

## 2023-05-11 MED ORDER — GUAIFENESIN-CODEINE 100-10 MG/5ML PO SYRP: 5.0000 mL | ORAL_SOLUTION | Freq: Three times a day (TID) | ORAL | 0 refills | Status: DC | PRN

## 2023-05-11 MED ORDER — BENZONATATE 200 MG PO CAPS: 200.0000 mg | ORAL_CAPSULE | Freq: Three times a day (TID) | ORAL | 0 refills | Status: DC | PRN

## 2023-05-11 NOTE — Telephone Encounter (Signed)
Spoke with son, having a flare of his cough.  Robitussin AC worked well last year without being too sedating.   -Robitussin AC -Tessalon Pearls  Will let me know if he gets worse/fails to improve.

## 2023-05-15 ENCOUNTER — Other Ambulatory Visit: Payer: Self-pay

## 2023-05-15 MED ORDER — METOPROLOL SUCCINATE ER 50 MG PO TB24: 50.0000 mg | ORAL_TABLET | Freq: Every day | ORAL | 3 refills | Status: DC

## 2023-05-16 ENCOUNTER — Telehealth: Payer: Self-pay | Admitting: *Deleted

## 2023-05-16 NOTE — Progress Notes (Signed)
  Care Coordination  Outreach Note  05/16/2023 Name: Nicholas Fleming MRN: [id number] DOB: 1946/06/20   Care Coordination Outreach Attempts: An unsuccessful telephone outreach was attempted today to offer the patient information about available care coordination services.  Follow Up Plan:  Additional outreach attempts will be made to offer the patient care coordination information and services.   Encounter Outcome:  No Answer  Burman Nieves, CCMA Care Coordination Care Guide Direct Dial: [phone]

## 2023-05-21 ENCOUNTER — Encounter: Payer: Self-pay | Admitting: Cardiology

## 2023-05-21 ENCOUNTER — Ambulatory Visit: Payer: Medicare HMO | Admitting: Cardiology

## 2023-05-21 VITALS — BP 120/72 | HR 76 | Resp 16 | Ht 66.0 in | Wt 138.2 lb

## 2023-05-21 DIAGNOSIS — J84112 Idiopathic pulmonary fibrosis: Secondary | ICD-10-CM | POA: Diagnosis not present

## 2023-05-21 DIAGNOSIS — E7800 Pure hypercholesterolemia, unspecified: Secondary | ICD-10-CM | POA: Diagnosis not present

## 2023-05-21 DIAGNOSIS — I10 Essential (primary) hypertension: Secondary | ICD-10-CM

## 2023-05-21 DIAGNOSIS — I25118 Atherosclerotic heart disease of native coronary artery with other forms of angina pectoris: Secondary | ICD-10-CM | POA: Diagnosis not present

## 2023-05-21 DIAGNOSIS — Z8673 Personal history of transient ischemic attack (TIA), and cerebral infarction without residual deficits: Secondary | ICD-10-CM | POA: Diagnosis not present

## 2023-05-21 DIAGNOSIS — I2510 Atherosclerotic heart disease of native coronary artery without angina pectoris: Secondary | ICD-10-CM | POA: Diagnosis not present

## 2023-05-21 MED ORDER — LOSARTAN POTASSIUM 25 MG PO TABS: 25.0000 mg | ORAL_TABLET | Freq: Every evening | ORAL | 3 refills | Status: DC

## 2023-05-21 NOTE — Progress Notes (Signed)
Primary Physician/Referring:  Gust Rung, DO  Patient ID: Nicholas Fleming, male    DOB: 07/04/1946, 77 y.o.   MRN: [id number]  Chief Complaint  Patient presents with   Coronary Artery Disease   Hypertension   Hyperlipidemia   Follow-up    7 months   HPI:    Nicholas Fleming  is a 77 y.o. 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 High-resolution CT revealing mild pulmonary artery dilatation, aortic atherosclerosis in addition to left main and three-vessel coronary artery calcification.   Patient has had documented TIA on 09/13/2022, he has had 1 several years ago.  He was in [place] in December 2023 had right-sided weakness and TIA-like symptoms lasting 20 minutes.  He has been on Plavix since then after he was treated with Brilinta for a month along with aspirin.  He has not had any recurrence.  States that his dyspnea is gradually progressing but overall no PND or orthopnea or leg edema.  Denies chest pain.   Past Medical History:  Diagnosis Date   Allergy    Asthma    Bronchitis    Controlled type 2 diabetes mellitus (HCC) 10/09/2019   Essential hypertension 10/09/2019   Hypothyroidism 10/09/2019   Psoriasis    Social History   Tobacco Use   Smoking status: Former    Packs/day: 0.25    Years: 1.00    Additional pack years: 0.00    Total pack years: 0.25    Types: Cigarettes    Quit date: 1964    Years since quitting: 60.4   Smokeless tobacco: Never  Substance Use Topics   Alcohol use: Yes    Comment: 1 to2 times per week   Marital Status: Married   ROS  Review of Systems  Cardiovascular:  Positive for dyspnea on exertion. Negative for chest pain and leg swelling.   Objective  Blood pressure 120/72, pulse 76, resp.  rate 16, height 5\' 6"  (1.676 m), weight 138 lb 3.2 oz (62.7 kg).     05/21/2023   12:30 PM 05/21/2023   11:47 AM 03/29/2023   11:13 AM  Vitals with BMI  Height  5\' 6"    Weight  138 lbs 3 oz   BMI  22.32   Systolic 120 139 308  Diastolic 72 69 83  Pulse  76      Physical Exam Neck:     Vascular: No carotid bruit or JVD.  Cardiovascular:     Rate and Rhythm: Normal rate and regular rhythm.     Pulses: Normal pulses and intact distal pulses.     Heart sounds: S1 normal and S2 normal. No murmur heard.    No gallop.  Pulmonary:     Effort: Pulmonary effort is normal.     Breath sounds: Examination of the right-lower field reveals rales. Examination of the left-lower field reveals rales. Rales (Bilateral basal coarse) present.  Abdominal:     General: Bowel sounds are normal.     Palpations: Abdomen is soft.  Musculoskeletal:     Right lower leg: No edema.     Left lower leg: No edema.    Laboratory examination:   Lab Results  Component Value Date   NA 137 02/05/2023   K 3.8 02/05/2023   CO2 29  02/05/2023   GLUCOSE 128 (H) 02/05/2023   BUN 14 02/05/2023   CREATININE 0.85 02/05/2023   CALCIUM 9.0 02/05/2023   EGFR 90 05/04/2022   GFRNONAA >60 01/18/2023       Latest Ref Rng & Units 02/05/2023    8:56 AM 01/18/2023    8:11 AM 09/13/2022    4:27 PM  CMP  Glucose 70 - 99 mg/dL 161  096  045   BUN 6 - 23 mg/dL 14  10  11    Creatinine 0.40 - 1.50 mg/dL 4.09  8.11  9.14   Sodium 135 - 145 mEq/L 137  134  137   Potassium 3.5 - 5.1 mEq/L 3.8  4.1  4.0   Chloride 96 - 112 mEq/L 101  101  103   CO2 19 - 32 mEq/L 29  27  26    Calcium 8.4 - 10.5 mg/dL 9.0  8.9  8.6   Total Protein 6.0 - 8.3 g/dL 6.5   7.5   Total Bilirubin 0.2 - 1.2 mg/dL 0.5   0.9   Alkaline Phos 39 - 117 U/L 45   44   AST 0 - 37 U/L 27   32   ALT 0 - 53 U/L 19   20       Latest Ref Rng & Units 01/18/2023    8:11 AM 09/13/2022    4:27 PM 05/04/2022   11:36 AM  CBC  WBC 4.0 - 10.5 K/uL 9.4  14.1  8.9    Hemoglobin 13.0 - 17.0 g/dL 78.2  95.6  21.3   Hematocrit 39.0 - 52.0 % 47.0  49.4  47.0   Platelets 150 - 400 K/uL 212  214  230    Lab Results  Component Value Date   CHOL 97 09/14/2022   HDL 39 (L) 09/14/2022   LDLCALC 47 09/14/2022   TRIG 54 09/14/2022   CHOLHDL 2.5 09/14/2022    HEMOGLOBIN A1C Lab Results  Component Value Date   HGBA1C 5.9 (H) 09/14/2022   MPG 122.63 09/14/2022   Lab Results  Component Value Date   TSH 4.110 05/04/2022    BNP    Component Value Date/Time   BNP 82.7 09/13/2022 1627   Radiology:   High-resolution CT chest 03/27/2023, comparison 02/28/2022: Cardiovascular: Atherosclerotic calcification of the aorta and coronary arteries. Heart is enlarged. No pericardial effusion. 2.   Lungs/Pleura: Peripheral and basilar predominant subpleural reticulation, coarsened ground-glass and traction bronchiectasis/bronchiolectasis, minimally progressive from 02/28/2022 and clearly progressive from 03/29/2021. Single layer honeycombing in the anterolateral lung bases. Minimal pleural thickening in the posterior right hemithorax. Dependent debris in the airway.   Cardiac Studies:   Exercise nuclear stress test 07/25/2021: Normal ECG stress. The patient exercised for 2 minutes and 21 seconds of a Bruce protocol, achieving approximately 4.64 METs. Terhe were occasional PVC and V-couplets during peak exercise. No chest pain. Symptoms included dyspnea. Normal BP response.  Myocardial perfusion is abnormal. There is a very small, mild reversible mild defect in the inferior region. TID is abnormal at 1.21.   Overall LV systolic function is abnormal without regional wall motion abnormalities. Stress LV EF: 46%.  No previous exam available for comparison. Intermediate risk study.  PCV ECHOCARDIOGRAM COMPLETE 05/04/2023  Narrative Echocardiogram 05/04/2023: Normal LV systolic function with EF 54%. Normal left ventricular wall thickness. Normal global wall motion.  Left ventricle cavity is normal in size. Doppler evidence of grade I (impaired) diastolic dysfunction, normal LAP. Left atrial cavity is mildly dilated. Mild (  Grade I) mitral regurgitation. Mild tricuspid regurgitation. Mild pulmonic regurgitation. No evidence of pulmonary hypertension. Previous study on 07/25/2021 reported EF 72%, negative double contrast study for PFO, no significant valvular abnormality.   EKG:   EKG 05/21/2023: Normal sinus rhythm at rate of 68 bpm, left atrial enlargement, poor R wave progression, probably normal.  Nonspecific T abnormality.  Compared to 10/05/2022, frequent PVCs not present.   Allergies   No Known Allergies  Current Outpatient Medications:    albuterol (PROVENTIL HFA) 108 (90 Base) MCG/ACT inhaler, Inhale 1-2 puffs into the lungs every 6 (six) hours as needed for wheezing or shortness of breath., Disp: 18 g, Rfl: 3   benzonatate (TESSALON) 200 MG capsule, Take 1 capsule (200 mg total) by mouth 3 (three) times daily as needed for cough., Disp: 30 capsule, Rfl: 0   budesonide-formoterol (SYMBICORT) 80-4.5 MCG/ACT inhaler, Inhale 2 puffs into the lungs 2 (two) times daily., Disp: 30.6 g, Rfl: 3   cholecalciferol (VITAMIN D3) 25 MCG (1000 UNIT) tablet, Take 1,000 Units by mouth daily., Disp: , Rfl:    clopidogrel (PLAVIX) 75 MG tablet, Take 1 tablet (75 mg total) by mouth daily., Disp: 90 tablet, Rfl: 3   guaiFENesin-codeine (ROBITUSSIN AC) 100-10 MG/5ML syrup, Take 5 mLs by mouth 3 (three) times daily as needed for cough., Disp: 120 mL, Rfl: 0   ketoconazole (NIZORAL) 2 % cream, Apply 1 application. topically daily. Qhs to feet, Disp: 60 g, Rfl: 3   levothyroxine (SYNTHROID) 50 MCG tablet, Take 1 tablet (50 mcg total) by mouth daily., Disp: 90 tablet, Rfl: 3   losartan (COZAAR) 25 MG tablet, Take 1 tablet (25 mg total) by mouth every evening., Disp: 90 tablet, Rfl: 3   metoprolol succinate (TOPROL-XL) 50 MG 24 hr tablet, Take 1 tablet (50 mg total) by mouth  daily. Take with or immediately following a meal., Disp: 90 tablet, Rfl: 3   Nintedanib (OFEV) 150 MG CAPS, Take 1 capsule (150 mg total) by mouth 2 (two) times daily., Disp: 180 capsule, Rfl: 1   pantoprazole (PROTONIX) 40 MG tablet, TAKE ONE TABLET BY MOUTH DAILY, Disp: 90 tablet, Rfl: 3   Risankizumab-rzaa (SKYRIZI) 150 MG/ML SOSY, Inject 150 mg into the skin as directed. Every 12 weeks for maintenance., Disp: 1 mL, Rfl: 1   rosuvastatin (CRESTOR) 20 MG tablet, Take 1 tablet (20 mg total) by mouth daily., Disp: 90 tablet, Rfl: 3   Assessment     ICD-10-CM   1. Coronary artery disease involving native coronary artery of native heart without angina pectoris  I25.10 EKG 12-Lead    losartan (COZAAR) 25 MG tablet    2. History of TIA (transient ischemic attack)  Z86.73 losartan (COZAAR) 25 MG tablet    3. Essential hypertension  I10     4. Hypercholesterolemia  E78.00     5. IPF (idiopathic pulmonary fibrosis) (HCC)  J84.112       Medications Discontinued During This Encounter  Medication Reason   aspirin EC 81 MG tablet    ticagrelor (BRILINTA) 90 MG TABS tablet      Meds ordered this encounter  Medications   losartan (COZAAR) 25 MG tablet    Sig: Take 1 tablet (25 mg total) by mouth every evening.    Dispense:  90 tablet    Refill:  3   Recommendations:   Porfirio Zuloaga is a 77 y.o. 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 High-resolution CT revealing mild pulmonary artery dilatation, aortic atherosclerosis in addition to left main and three-vessel coronary artery calcification.   1. Coronary artery disease involving native coronary artery of native heart without angina pectoris [facility]) Patient presents for 48-month office visit, he has  remained stable without anginal symptoms.  - EKG 12-Lead - losartan (COZAAR) 25 MG tablet; Take 1 tablet (25 mg total) by mouth every evening.  Dispense: 90 tablet; Refill: 3  2. History of TIA (transient ischemic attack) Patient presented to the emergency room on 09/13/2022 with right-sided weakness, was evaluated and ruled out for stroke, MRI did not reveal any significant changes of acute stroke.  Patient has had similar episode while he was in [place] in December 2023, he is now back on Plavix and aspirin was discontinued.  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  - losartan (COZAAR) 25 MG tablet; Take 1 tablet (25 mg total) by mouth every evening.  Dispense: 90 tablet; Refill: 3  3. Essential hypertension Blood pressure is well-controlled however in view of CAD and also TIA symptoms, I have restarted him back on lower dose of losartan 25 mg in the evening, previously he was on telmisartan 20 mg daily.  4. Hypercholesterolemia External labs reviewed, lipids in excellent control.  5. IPF (idiopathic pulmonary fibrosis) (HCC) I reviewed his high-resolution CT scan, slight progression of IPF.  Advised him to follow-up with Dr. Isaiah Serge with regard to both IPF and also evaluate for need for oxygen if necessary.  Otherwise stable from cardiac standpoint, I will see him back in a year.   Yates Decamp, MD, [facility] 05/21/2023, 12:41 PM Office: [phone] Fax: [phone] Pager: [phone]

## 2023-05-21 NOTE — Patient Instructions (Signed)
It was great to see you today.  Your blood pressure is well-controlled however it was elevated only upon arrival.  In view of prior TIA, coronary artery disease, I would like you to be on ARB all the time, previously you are on Micardis 20 mg daily, I switched you to losartan 25 mg in the evening.  If you feel dizziness please let us know.  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 Isaiah Serge, you may need a 6-minute walk test as well to evaluate for oxygen needs.  Otherwise from cardiac standpoint was stable.

## 2023-06-08 NOTE — Progress Notes (Signed)
  Care Coordination   Note   06/08/2023 Name: Nicholas Fleming MRN: [id number] DOB: July 10, 1946  Nicholas Fleming is a 77 y.o. year old male who sees Gust Rung, DO for primary care. I reached out to [name] by phone today to offer care coordination services.  Mr. Dollard was given information about Care Coordination services today including:   The Care Coordination services include support from the care team which includes your Nurse Coordinator, Clinical Social Worker, or Pharmacist.  The Care Coordination team is here to help remove barriers to the health concerns and goals most important to you. Care Coordination services are voluntary, and the patient may decline or stop services at any time by request to their care team member.   Care Coordination Consent Status: Patient did not agree to participate in care coordination services at this time.  Follow up plan:  none indicated   Encounter Outcome:  Pt. Refused  Burman Nieves, CCMA Care Coordination Care Guide Direct Dial: [phone]

## 2023-06-13 ENCOUNTER — Other Ambulatory Visit: Payer: Self-pay

## 2023-06-13 DIAGNOSIS — L400 Psoriasis vulgaris: Secondary | ICD-10-CM

## 2023-06-13 MED ORDER — SKYRIZI 150 MG/ML ~~LOC~~ SOSY: 150.0000 mg | PREFILLED_SYRINGE | SUBCUTANEOUS | 1 refills | Status: DC

## 2023-06-13 NOTE — Progress Notes (Signed)
Refill request faxed to [place] from [facility]. Escripted

## 2023-06-15 ENCOUNTER — Other Ambulatory Visit: Payer: Self-pay | Admitting: Pulmonary Disease

## 2023-06-19 ENCOUNTER — Other Ambulatory Visit: Payer: Self-pay

## 2023-06-20 DIAGNOSIS — H6121 Impacted cerumen, right ear: Secondary | ICD-10-CM | POA: Diagnosis not present

## 2023-06-20 MED ORDER — BUDESONIDE-FORMOTEROL FUMARATE 80-4.5 MCG/ACT IN AERO: 2.0000 | INHALATION_SPRAY | Freq: Two times a day (BID) | RESPIRATORY_TRACT | 3 refills | Status: DC

## 2023-06-26 ENCOUNTER — Other Ambulatory Visit: Payer: Self-pay | Admitting: *Deleted

## 2023-06-26 DIAGNOSIS — J84112 Idiopathic pulmonary fibrosis: Secondary | ICD-10-CM

## 2023-07-02 ENCOUNTER — Telehealth: Payer: Self-pay | Admitting: Internal Medicine

## 2023-07-02 MED ORDER — AMOXICILLIN-POT CLAVULANATE 875-125 MG PO TABS: 1.0000 | ORAL_TABLET | Freq: Two times a day (BID) | ORAL | 0 refills | Status: AC

## 2023-07-02 NOTE — Telephone Encounter (Signed)
Received call from Ryer's son, having increased right ear pain, when to urgent care on 7/3 with impacted cerumen removal.  Pain has been worsening.  Worried about infection.  Will call in augmentin and have him come see me on Thursday if not better.

## 2023-07-06 NOTE — Addendum Note (Signed)
Addended by: Lajoyce Lauber A on: 07/06/2023 04:28 PM   Modules accepted: Orders

## 2023-07-10 ENCOUNTER — Other Ambulatory Visit: Payer: Self-pay

## 2023-07-10 NOTE — Telephone Encounter (Signed)
Also requesting a rx refill for  amoxicillin-clavulanate medication has expired off med list.

## 2023-07-11 MED ORDER — LEVOTHYROXINE SODIUM 50 MCG PO TABS: 50.0000 ug | ORAL_TABLET | Freq: Every day | ORAL | 3 refills | Status: DC

## 2023-07-11 MED ORDER — GUAIFENESIN-CODEINE 100-10 MG/5ML PO SYRP: 5.0000 mL | ORAL_SOLUTION | Freq: Three times a day (TID) | ORAL | 0 refills | Status: DC | PRN

## 2023-07-11 NOTE — Telephone Encounter (Signed)
Spoke with son, only needs robitussin and synthroid, ear infection has resolved.

## 2023-07-25 ENCOUNTER — Other Ambulatory Visit (HOSPITAL_COMMUNITY): Payer: Self-pay | Admitting: Interventional Radiology

## 2023-07-25 DIAGNOSIS — I771 Stricture of artery: Secondary | ICD-10-CM

## 2023-07-26 ENCOUNTER — Ambulatory Visit: Payer: Medicare HMO | Attending: Pulmonary Disease

## 2023-07-26 DIAGNOSIS — J849 Interstitial pulmonary disease, unspecified: Secondary | ICD-10-CM | POA: Diagnosis not present

## 2023-07-26 DIAGNOSIS — J84112 Idiopathic pulmonary fibrosis: Secondary | ICD-10-CM

## 2023-07-26 MED ORDER — ALBUTEROL SULFATE (2.5 MG/3ML) 0.083% IN NEBU
2.5000 mg | INHALATION_SOLUTION | Freq: Once | RESPIRATORY_TRACT | Status: AC
  Administered 2023-07-26: 2.5 mg via RESPIRATORY_TRACT
  Filled 2023-07-26: qty 3

## 2023-07-27 ENCOUNTER — Other Ambulatory Visit: Payer: Self-pay | Admitting: Internal Medicine

## 2023-07-27 MED ORDER — BENZONATATE 200 MG PO CAPS: 200.0000 mg | ORAL_CAPSULE | Freq: Three times a day (TID) | ORAL | 1 refills | Status: DC | PRN

## 2023-07-27 NOTE — Progress Notes (Signed)
Tessalon refill sent

## 2023-08-07 ENCOUNTER — Encounter: Payer: Self-pay | Admitting: Neurology

## 2023-08-07 ENCOUNTER — Ambulatory Visit (INDEPENDENT_AMBULATORY_CARE_PROVIDER_SITE_OTHER): Payer: Medicare HMO | Admitting: Neurology

## 2023-08-07 ENCOUNTER — Telehealth: Payer: Self-pay | Admitting: Neurology

## 2023-08-07 VITALS — BP 115/70 | HR 78 | Ht 66.0 in | Wt 143.0 lb

## 2023-08-07 DIAGNOSIS — H8191 Unspecified disorder of vestibular function, right ear: Secondary | ICD-10-CM | POA: Diagnosis not present

## 2023-08-07 DIAGNOSIS — G459 Transient cerebral ischemic attack, unspecified: Secondary | ICD-10-CM | POA: Diagnosis not present

## 2023-08-07 DIAGNOSIS — R42 Dizziness and giddiness: Secondary | ICD-10-CM

## 2023-08-07 NOTE — Telephone Encounter (Signed)
Dr Pearlean Brownie wanted me to add the patient on his schedule for 8/20 at 4 pm. Called spoke with the son. He will reach out to see if there is someone that can bring him at 4 pm today.

## 2023-08-07 NOTE — Patient Instructions (Addendum)
I had a long d/w patient and his daughter inlaw about his recent recurrent dizzy spell in Dec 2023 possible posterior circulation TIA, risk for recurrent stroke/TIAs, personally independently reviewed imaging studies and stroke evaluation results and answered questions.Continue Plavix 75 mg daily  for secondary stroke prevention and maintain strict control of hypertension with blood pressure goal below 130/90, diabetes with hemoglobin A1c goal below 6.5% and lipids with LDL cholesterol goal below 70 mg/dL. I also advised the patient to eat a healthy diet with plenty of whole grains, cereals, fruits and vegetables, exercise regularly and maintain ideal body weight . 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  Check repeat CT angiogram and if there is progressive right vertebral artery origin stenosis may consider angioplasty stenting and referred to Dr. Corliss Skains for the same..Followup in the future with me  in 6 months and call for questions.

## 2023-08-07 NOTE — Telephone Encounter (Signed)
Referral for physical therapy fax to [facility]. Phone: [phone], Fax: [phone],.

## 2023-08-07 NOTE — Progress Notes (Signed)
[facility] [address] [place]. [place] [address] [phone]       OFFICE FOLLOW UP VISIT NOTE  [name] Date of Birth:  Jun 20, 1946 Medical Record Number:  [id number]   Referring MD: Dr. Heide Spark  Reason for Referral: TIA  HPI: Initial visit 10/18/2022: Nicholas Fleming is a pleasant 77 year old [place] male seen for initial office consultation visit for TIA.  He is accompanied by his daughter-in-law.  History is obtained from them and review of electronic medical records.  I personally reviewed pertinent available imaging films in PACS.  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  He had somewhat similar episode a year ago which lasted 30 minutes and resolved and at that time he has not sought medical help.  Patient was seen by me on telemetry neurology consultation for symptoms have resolved.  CT head was unremarkable CT angiogram showed no large vessel stenosis or occlusion.  MRI scan of the brain was obtained the next day which showed no infarct or abnormality.  Transthoracic echo showed ejection fraction of 55 to 60% without significant wall motion abnormalities.  Left atrium size was moderately dilated.  Agitated saline contrast bubble study was negative.  LDL cholesterol 47 mg percent.  Hemoglobin A1c 9.  Patient was started on dual antiplatelet therapy aspirin and Plavix for 3 weeks.  Completed continue on Plavix alone.  He states is done well.  He has had no recurrent episodes of dizziness or any other TIA or strokelike symptoms.  He is tolerating Plavix well without significant bruising or bleeding or other side effects.  He is also on tolerating well without he has no complaints today.  He plans  to travel to [place] soon to spend his winter there. Update  08/07/2023 : He returns for follow-up after last visit with me 9 months ago.  He is accompanied by his daughter-in-law.  Patient states he was doing fine until December 2023 when he was visiting [place].  Similar episode of sudden onset of dizziness and feeling off balance and leaning to the right.  He denied any accompanying headache, vertigo, blurred vision.  He did have some nausea.  He denied extremity weakness or numbness.  The episode lasted about 30 minutes.  He did not seek medical help right away.  He went and saw a cardiologist the next day and had some testing done but did not get any brain or neurovascular imaging done.  He mentioned this to his son after return and was advised to see me.  He denies any other recurrent stroke or TIA symptoms.  He denies any ringing in his ears, decreased hearing or vertigo.  He denies positional vertigo.  He remains on Plavix which is tolerating well with minor bruising and no bleeding.  He states his blood pressure is under good control.  He is tolerating Crestor well without muscle aches and pains but has not had any recent lipid profile checked.  Last lipid profile on 08/25/2022 showed LDL to be 47 mg percent and hemoglobin A1c 5.8.  He did undergo diagnostic cerebral catheter angiogram by Dr. Corliss Skains on 01/18/2023 which had shown only 50% narrowing of the right vertebral artery origin and was conservative medical therapy was recommended.  Repeat CT angiogram has been ordered and is scheduled  for next week. ROS:   14 system review of systems is positive for dizziness, imbalance, leaning to 1 side and nausea and all other systems negative  PMH:  Past Medical History:  Diagnosis Date   Allergy    Asthma    Bronchitis    Controlled type 2 diabetes mellitus (HCC) 10/09/2019   Essential hypertension 10/09/2019   Hypothyroidism 10/09/2019   Psoriasis     Social History:  Social History   Socioeconomic History   Marital status: Married    Spouse name: Not on  file   Number of children: 2   Years of education: Not on file   Highest education level: Not on file  Occupational History   Not on file  Tobacco Use   Smoking status: Former    Current packs/day: 0.00    Average packs/day: 0.3 packs/day for 1 year (0.3 ttl pk-yrs)    Types: Cigarettes    Start date: 11    Quit date: 1964    Years since quitting: 60.6   Smokeless tobacco: Never  Vaping Use   Vaping status: Never Used  Substance and Sexual Activity   Alcohol use: Yes    Comment: 1 to2 times per week   Drug use: Never   Sexual activity: Not on file  Other Topics Concern   Not on file  Social History Narrative   Not on file   Social Determinants of Health   Financial Resource Strain: Not on file  Food Insecurity: No Food Insecurity (09/13/2022)   Hunger Vital Sign    Worried About Running Out of Food in the Last Year: Never true    Ran Out of Food in the Last Year: Never true  Transportation Needs: No Transportation Needs (09/13/2022)   PRAPARE - Administrator, Civil Service (Medical): No    Lack of Transportation (Non-Medical): No  Physical Activity: Not on file  Stress: Not on file  Social Connections: Not on file  Intimate Partner Violence: Not on file    Medications:   Current Outpatient Medications on File Prior to Visit  Medication Sig Dispense Refill   albuterol (PROVENTIL HFA) 108 (90 Base) MCG/ACT inhaler Inhale 1-2 puffs into the lungs every 6 (six) hours as needed for wheezing or shortness of breath. 18 g 3   benzonatate (TESSALON) 200 MG capsule Take 1 capsule (200 mg total) by mouth 3 (three) times daily as needed for cough. 30 capsule 1   budesonide-formoterol (SYMBICORT) 80-4.5 MCG/ACT inhaler Inhale 2 puffs into the lungs 2 (two) times daily. 30.6 g 3   cholecalciferol (VITAMIN D3) 25 MCG (1000 UNIT) tablet Take 1,000 Units by mouth daily.     clopidogrel (PLAVIX) 75 MG tablet Take 1 tablet (75 mg total) by mouth daily. 90 tablet 3    guaiFENesin-codeine (ROBITUSSIN AC) 100-10 MG/5ML syrup Take 5 mLs by mouth 3 (three) times daily as needed for cough. 120 mL 0   ketoconazole (NIZORAL) 2 % cream Apply 1 application. topically daily. Qhs to feet 60 g 3   levothyroxine (SYNTHROID) 50 MCG tablet Take 1 tablet (50 mcg total) by mouth daily. 90 tablet 3   losartan (COZAAR) 25 MG tablet Take 1 tablet (25 mg total) by mouth every evening. 90 tablet 3   metoprolol succinate (TOPROL-XL) 50 MG 24 hr tablet Take 1 tablet (50 mg total) by mouth daily. Take with or immediately following a meal. 90 tablet 3   Nintedanib (OFEV) 150 MG CAPS Take 1 capsule (  150 mg total) by mouth 2 (two) times daily. 180 capsule 1   nystatin (MYCOSTATIN) 100000 UNIT/ML suspension Take 5 mLs by mouth 4 (four) times daily.     pantoprazole (PROTONIX) 40 MG tablet TAKE 1 TABLET BY MOUTH DAILY 90 tablet 3   risankizumab-rzaa (SKYRIZI) 150 MG/ML SOSY prefilled syringe Inject 1 mL (150 mg total) into the skin as directed. Every 12 weeks for maintenance. 1 mL 1   rosuvastatin (CRESTOR) 20 MG tablet Take 1 tablet (20 mg total) by mouth daily. 90 tablet 3   No current facility-administered medications on file prior to visit.    Allergies:  No Known Allergies  Physical Exam General: well developed, well nourished pleasant elderly [place] male, seated, in no evident distress Head: head normocephalic and atraumatic.   Neck: supple with no carotid or supraclavicular bruits Cardiovascular: regular rate and rhythm, no murmurs Musculoskeletal: no deformity Skin:  no rash/petichiae Vascular:  Normal pulses all extremities  Neurologic Exam Mental Status: Awake and fully alert. Oriented to place and time. Recent and remote memory intact. Attention span, concentration and fund of knowledge appropriate. Mood and affect appropriate.  Cranial Nerves: Fundoscopic exam reveals sharp disc margins. Pupils equal, briskly reactive to light. Extraocular movements full  without nystagmus. Visual fields full to confrontation. Hearing intact. Facial sensation intact. Face, tongue, palate moves normally and symmetrically.  Motor: Normal bulk and tone. Normal strength in all tested extremity muscles. Sensory.: intact to touch , pinprick , position and vibratory sensation.  Coordination: Rapid alternating movements normal in all extremities. Finger-to-nose and heel-to-shin performed accurately bilaterally.Positive Fukuda stepping test with patient moving several steps of base and rotating to the right Gait and Station: Arises from chair without difficulty. Stance is normal. Gait demonstrates normal stride length and balance . Able to heel, toe and tandem walk with  difficulty.  Reflexes: 1+ and symmetric. Toes downgoing.     ASSESSMENT:77 year old [place] origin male with sudden onset of dizziness, vomiting and leaning to the right on 09/13/2022 likely due to posterior circulation TIA versus small infarct not visualized on MRI.  Repeat similar episode in December 2023 also possibly TIA though physical exam does suggest some right-sided peripheral vestibular dysfunction.  Vascular risk factors of hyperlipidemia and hypertension only     PLAN: I had a long d/w patient and his daughter inlaw about his recent TIA, risk for recurrent stroke/TIAs, personally independently reviewed imaging studies and stroke evaluation results and answered questions.Continue Plavix 75 mg daily  for secondary stroke prevention and maintain strict control of hypertension with blood pressure goal below 130/90, diabetes with hemoglobin A1c goal below 6.5% and lipids with LDL cholesterol goal below 70 mg/dL. I also advised the patient to eat a healthy diet with plenty of whole grains, cereals, fruits and vegetables, exercise regularly and maintain ideal body weight . He also has mild peripheral vestibular dysfunction on exam hence I recommend he avoid sudden jerky movements and get up  slowing.Followup in the future with me as needed only and call for questions.  Greater than 50% time during this 35-minute   visit was spent on counseling and coordination of care about his TIA and discussion about TIA and stroke prevention and treatment and answering questions.  Delia Heady, MD  Note: This document was prepared with digital dictation and possible smart phrase technology. Any transcriptional errors that result from this process are unintentional.

## 2023-08-08 LAB — LIPID PANEL
Chol/HDL Ratio: 2.1 ratio (ref 0.0–5.0)
Cholesterol, Total: 108 mg/dL (ref 100–199)
HDL: 51 mg/dL (ref >39)
LDL Chol Calc (NIH): 42 mg/dL (ref 0–99)
Triglycerides: 69 mg/dL (ref 0–149)
VLDL Cholesterol Cal: 15 mg/dL (ref 5–40)

## 2023-08-08 LAB — HEMOGLOBIN A1C
Est. average glucose Bld gHb Est-mCnc: 134 mg/dL
Hgb A1c MFr Bld: 6.3 % — ABNORMAL HIGH (ref 4.8–5.6)

## 2023-08-08 NOTE — Telephone Encounter (Signed)
Scheduled 8/29 at 5pm with Marguarite Arbour.

## 2023-08-10 ENCOUNTER — Telehealth: Payer: Self-pay | Admitting: Cardiology

## 2023-08-10 DIAGNOSIS — I2729 Other secondary pulmonary hypertension: Secondary | ICD-10-CM

## 2023-08-10 DIAGNOSIS — J84112 Idiopathic pulmonary fibrosis: Secondary | ICD-10-CM

## 2023-08-10 NOTE — Telephone Encounter (Signed)
I received a call from Dr. Isaiah Serge, regarding stable IPF however worsening dyspnea, suspicion for worsening pulmonary hypertension.  I discussed with patient's son Dr. Heide Spark, discussed regarding right heart catheterization.  We have agreed to proceed with the same, will obtain baseline labs and schedule this as an outpatient basis and make further recommendations.    ICD-10-CM   1. IPF (idiopathic pulmonary fibrosis) (HCC)  J84.112 CBC    Basic metabolic panel    2. Other secondary pulmonary hypertension (HCC)  I27.29 CBC    Basic metabolic panel    Brain natriuretic peptide

## 2023-08-14 ENCOUNTER — Ambulatory Visit (HOSPITAL_COMMUNITY): Payer: Medicare HMO

## 2023-08-15 DIAGNOSIS — I2729 Other secondary pulmonary hypertension: Secondary | ICD-10-CM | POA: Diagnosis not present

## 2023-08-15 DIAGNOSIS — J84112 Idiopathic pulmonary fibrosis: Secondary | ICD-10-CM | POA: Diagnosis not present

## 2023-08-16 ENCOUNTER — Ambulatory Visit (HOSPITAL_COMMUNITY)
Admission: RE | Admit: 2023-08-16 | Discharge: 2023-08-16 | Disposition: A | Discharge status: Home / Self Care | Payer: Medicare HMO | Source: Ambulatory Visit | Attending: Interventional Radiology | Admitting: Interventional Radiology

## 2023-08-16 DIAGNOSIS — I672 Cerebral atherosclerosis: Secondary | ICD-10-CM | POA: Diagnosis not present

## 2023-08-16 DIAGNOSIS — I771 Stricture of artery: Secondary | ICD-10-CM | POA: Insufficient documentation

## 2023-08-16 DIAGNOSIS — I6501 Occlusion and stenosis of right vertebral artery: Secondary | ICD-10-CM | POA: Diagnosis not present

## 2023-08-16 DIAGNOSIS — I6523 Occlusion and stenosis of bilateral carotid arteries: Secondary | ICD-10-CM | POA: Diagnosis not present

## 2023-08-16 LAB — CBC
Hematocrit: 47 % (ref 37.5–51.0)
Hemoglobin: 15 g/dL (ref 13.0–17.7)
MCH: 31 pg (ref 26.6–33.0)
MCHC: 31.9 g/dL (ref 31.5–35.7)
MCV: 97 fL (ref 79–97)
Platelets: 231 10*3/uL (ref 150–450)
RBC: 4.84 x10E6/uL (ref 4.14–5.80)
RDW: 13.5 % (ref 11.6–15.4)
WBC: 9.8 10*3/uL (ref 3.4–10.8)

## 2023-08-16 LAB — BASIC METABOLIC PANEL
BUN/Creatinine Ratio: 12 (ref 10–24)
BUN: 9 mg/dL (ref 8–27)
CO2: 23 mmol/L (ref 20–29)
Calcium: 8.2 mg/dL — ABNORMAL LOW (ref 8.6–10.2)
Chloride: 104 mmol/L (ref 96–106)
Creatinine, Ser: 0.77 mg/dL (ref 0.76–1.27)
Glucose: 118 mg/dL — ABNORMAL HIGH (ref 70–99)
Potassium: 4.4 mmol/L (ref 3.5–5.2)
Sodium: 139 mmol/L (ref 134–144)
eGFR: 92 mL/min/{1.73_m2} (ref >59)

## 2023-08-16 LAB — POCT I-STAT CREATININE: Creatinine, Ser: 0.9 mg/dL (ref 0.61–1.24)

## 2023-08-16 MED ORDER — SODIUM CHLORIDE (PF) 0.9 % IJ SOLN
INTRAMUSCULAR | Status: AC
  Filled 2023-08-16: qty 50

## 2023-08-16 MED ORDER — IOHEXOL 350 MG/ML SOLN
75.0000 mL | Freq: Once | INTRAVENOUS | Status: AC | PRN
  Administered 2023-08-16: 75 mL via INTRAVENOUS

## 2023-08-16 NOTE — H&P (Signed)
Primary Physician/Referring:  Gust Rung, DO  Patient ID: Nicholas Fleming, male    DOB: [birth year], 77 y.o.   MRN: [id number]  CC: Dyspnea on exertion  HPI:    Mort Estock  is a 77 y.o. 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 High-resolution CT revealing mild pulmonary artery dilatation, aortic atherosclerosis in addition to left main and three-vessel coronary artery calcification.   Patient has had documented TIA on 09/13/2022, he has had 1 several years ago.  He was in [place] in December 2023 had right-sided weakness and TIA-like symptoms lasting 20 minutes.  He has been on Plavix since then after he was treated with Brilinta for a month along with aspirin.  He has not had any recurrence.  States that his dyspnea is gradually progressing but overall no PND or orthopnea or leg edema.  Denies chest pain.  He was evaluated by pulmonary medicine, and was recommended right heart catheterization in view of relatively stable PFTs but worsening dyspnea and suspicion for pulmonary hypertension in view of ILD.  Hence he is now scheduled for right heart catheterization.  Past Medical History:  Diagnosis Date   Allergy    Asthma    Bronchitis    Controlled type 2 diabetes mellitus (HCC) 10/09/2019   Essential hypertension 10/09/2019   Hypothyroidism 10/09/2019   Psoriasis    Social History   Tobacco Use   Smoking status: Former    Current packs/day: 0.00    Average packs/day: 0.3 packs/day for 1 year (0.3 ttl pk-yrs)    Types: Cigarettes    Start date: 1963    Quit date: 1964    Years since quitting: 60.7   Smokeless tobacco: Never  Substance Use Topics   Alcohol use: Yes    Comment: 1 to2 times per week   Marital Status: Married    ROS  Review of Systems  Cardiovascular:  Positive for dyspnea on exertion. Negative for chest pain and leg swelling.   Objective  There were no vitals taken for this visit.     08/07/2023    3:56 PM 05/21/2023   12:30 PM 05/21/2023   11:47 AM  Vitals with BMI  Height 5\' 6"   5\' 6"   Weight 143 lbs  138 lbs 3 oz  BMI 23.09  22.32  Systolic 115 120 657  Diastolic 70 72 69  Pulse 78  76     Physical Exam Neck:     Vascular: No carotid bruit or JVD.  Cardiovascular:     Rate and Rhythm: Normal rate and regular rhythm.     Pulses: Normal pulses and intact distal pulses.     Heart sounds: S1 normal and S2 normal. No murmur heard.    No gallop.  Pulmonary:     Effort: Pulmonary effort is normal.     Breath sounds: Examination of the right-lower field reveals rales. Examination of the left-lower field reveals rales. Rales (Bilateral basal coarse) present.  Abdominal:     General: Bowel sounds are normal.     Palpations: Abdomen is soft.  Musculoskeletal:     Right lower leg: No edema.     Left lower leg: No edema.    Laboratory examination:   Lab Results  Component Value Date   NA  139 08/15/2023   K 4.4 08/15/2023   CO2 23 08/15/2023   GLUCOSE 118 (H) 08/15/2023   BUN 9 08/15/2023   CREATININE 0.90 08/16/2023   CALCIUM 8.2 (L) 08/15/2023   EGFR 92 08/15/2023   GFRNONAA >60 01/18/2023       Latest Ref Rng & Units 08/16/2023    2:59 PM 08/15/2023    3:46 PM 02/05/2023    8:56 AM  CMP  Glucose 70 - 99 mg/dL  161  096   BUN 8 - 27 mg/dL  9  14   Creatinine 0.45 - 1.24 mg/dL 4.09  8.11  9.14   Sodium 134 - 144 mmol/L  139  137   Potassium 3.5 - 5.2 mmol/L  4.4  3.8   Chloride 96 - 106 mmol/L  104  101   CO2 20 - 29 mmol/L  23  29   Calcium 8.6 - 10.2 mg/dL  8.2  9.0   Total Protein 6.0 - 8.3 g/dL   6.5   Total Bilirubin 0.2 - 1.2 mg/dL   0.5   Alkaline Phos 39 - 117 U/L   45   AST 0 - 37 U/L   27   ALT 0 - 53 U/L   19       Latest Ref Rng & Units 08/15/2023     3:46 PM 01/18/2023    8:11 AM 09/13/2022    4:27 PM  CBC  WBC 3.4 - 10.8 x10E3/uL 9.8  9.4  14.1   Hemoglobin 13.0 - 17.7 g/dL 78.2  95.6  21.3   Hematocrit 37.5 - 51.0 % 47.0  47.0  49.4   Platelets 150 - 450 x10E3/uL 231  212  214    Lab Results  Component Value Date   CHOL 108 08/07/2023   HDL 51 08/07/2023   LDLCALC 42 08/07/2023   TRIG 69 08/07/2023   CHOLHDL 2.1 08/07/2023    HEMOGLOBIN A1C Lab Results  Component Value Date   HGBA1C 6.3 (H) 08/07/2023   MPG 122.63 09/14/2022   Lab Results  Component Value Date   TSH 4.110 05/04/2022    BNP    Component Value Date/Time   BNP 82.7 09/13/2022 1627   Radiology:   High-resolution CT chest 03/27/2023, comparison 02/28/2022: Cardiovascular: Atherosclerotic calcification of the aorta and coronary arteries. Heart is enlarged. No pericardial effusion. 2.   Lungs/Pleura: Peripheral and basilar predominant subpleural reticulation, coarsened ground-glass and traction bronchiectasis/bronchiolectasis, minimally progressive from 02/28/2022 and clearly progressive from 03/29/2021. Single layer honeycombing in the anterolateral lung bases. Minimal pleural thickening in the posterior right hemithorax. Dependent debris in the airway.   Cardiac Studies:   Exercise nuclear stress test 07/25/2021: Normal ECG stress. The patient exercised for 2 minutes and 21 seconds of a Bruce protocol, achieving approximately 4.64 METs. Terhe were occasional PVC and V-couplets during peak exercise. No chest pain. Symptoms included dyspnea. Normal BP response.  Myocardial perfusion is abnormal. There is a very small, mild reversible mild defect in the inferior region. TID is abnormal at 1.21.   Overall LV systolic function is abnormal without regional wall motion abnormalities. Stress LV EF: 46%.  No previous exam available for comparison. Intermediate risk study.  PCV ECHOCARDIOGRAM COMPLETE 05/04/2023  Narrative Echocardiogram 05/04/2023: Normal LV  systolic function with EF 54%. Normal left ventricular wall thickness. Normal global wall motion. Left ventricle cavity is normal in size. Doppler evidence of grade I (impaired) diastolic dysfunction, normal LAP. Left atrial cavity is mildly dilated. Mild (  Grade I) mitral regurgitation. Mild tricuspid regurgitation. Mild pulmonic regurgitation. No evidence of pulmonary hypertension. Previous study on 07/25/2021 reported EF 72%, negative double contrast study for PFO, no significant valvular abnormality.   EKG:   EKG 05/21/2023: Normal sinus rhythm at rate of 68 bpm, left atrial enlargement, poor R wave progression, probably normal.  Nonspecific T abnormality.  Compared to 10/05/2022, frequent PVCs not present.   Allergies   No Known Allergies  Current Facility-Administered Medications:    0.9 %  sodium chloride infusion, , Intravenous, Continuous, Yates Decamp, MD   0.9 %  sodium chloride infusion, 250 mL, Intravenous, PRN, Yates Decamp, MD   sodium chloride flush (NS) 0.9 % injection 3 mL, 3 mL, Intravenous, Q12H, Yates Decamp, MD   sodium chloride flush (NS) 0.9 % injection 3 mL, 3 mL, Intravenous, PRN, Yates Decamp, MD   Assessment   1.  Dyspnea on exertion 2.  Interstitial lung disease 3.  Coronary artery disease of the native vessel without angina pectoris  Recommendations:   Jessee Koogler is a 77 y.o. 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 High-resolution CT revealing mild pulmonary artery dilatation, aortic atherosclerosis in addition to left main and three-vessel coronary artery calcification.  Due to worsening dyspnea, relatively stable ILD, was recommended right heart catheterization to evaluate for pulm hypertension by pulmonary  medicine.  I have discussed the procedure with the patient's son who is a physician, agrees to proceed with catheterization.  I will make further recommendation after the right heart catheterization.  I have discussed potential complications including bleeding, DVT, minor trauma, rare complication of pericardial tamponade and or heart block.   Yates Decamp, MD, [facility] 08/17/2023, 9:14 AM Office: [phone] Fax: [phone] Pager: [phone]

## 2023-08-17 ENCOUNTER — Other Ambulatory Visit: Payer: Self-pay

## 2023-08-17 ENCOUNTER — Encounter (HOSPITAL_COMMUNITY)
Admission: RE | Disposition: A | Discharge status: Home / Self Care | Payer: Self-pay | Source: Home / Self Care | Attending: Cardiology

## 2023-08-17 ENCOUNTER — Ambulatory Visit (HOSPITAL_COMMUNITY)
Admission: RE | Admit: 2023-08-17 | Discharge: 2023-08-17 | Disposition: A | Discharge status: Home / Self Care | Payer: Medicare HMO | Source: Home / Self Care | Attending: Cardiology | Admitting: Cardiology

## 2023-08-17 DIAGNOSIS — I10 Essential (primary) hypertension: Secondary | ICD-10-CM | POA: Diagnosis not present

## 2023-08-17 DIAGNOSIS — R0609 Other forms of dyspnea: Secondary | ICD-10-CM | POA: Insufficient documentation

## 2023-08-17 DIAGNOSIS — I2510 Atherosclerotic heart disease of native coronary artery without angina pectoris: Secondary | ICD-10-CM | POA: Diagnosis not present

## 2023-08-17 DIAGNOSIS — I272 Pulmonary hypertension, unspecified: Secondary | ICD-10-CM | POA: Insufficient documentation

## 2023-08-17 DIAGNOSIS — Z955 Presence of coronary angioplasty implant and graft: Secondary | ICD-10-CM | POA: Diagnosis not present

## 2023-08-17 DIAGNOSIS — J84112 Idiopathic pulmonary fibrosis: Secondary | ICD-10-CM | POA: Insufficient documentation

## 2023-08-17 DIAGNOSIS — I700 Atherosclerosis of aorta: Secondary | ICD-10-CM | POA: Insufficient documentation

## 2023-08-17 DIAGNOSIS — L409 Psoriasis, unspecified: Secondary | ICD-10-CM | POA: Insufficient documentation

## 2023-08-17 DIAGNOSIS — I2729 Other secondary pulmonary hypertension: Secondary | ICD-10-CM

## 2023-08-17 DIAGNOSIS — E119 Type 2 diabetes mellitus without complications: Secondary | ICD-10-CM | POA: Diagnosis not present

## 2023-08-17 DIAGNOSIS — E785 Hyperlipidemia, unspecified: Secondary | ICD-10-CM | POA: Insufficient documentation

## 2023-08-17 HISTORY — PX: RIGHT HEART CATH: CATH118263

## 2023-08-17 LAB — POCT I-STAT EG7
Acid-base deficit: 1 mmol/L (ref 0.0–2.0)
Acid-base deficit: 2 mmol/L (ref 0.0–2.0)
Bicarbonate: 23.9 mmol/L (ref 20.0–28.0)
Bicarbonate: 24.9 mmol/L (ref 20.0–28.0)
Calcium, Ion: 1.17 mmol/L (ref 1.15–1.40)
Calcium, Ion: 1.21 mmol/L (ref 1.15–1.40)
HCT: 44 % (ref 39.0–52.0)
HCT: 45 % (ref 39.0–52.0)
Hemoglobin: 15 g/dL (ref 13.0–17.0)
Hemoglobin: 15.3 g/dL (ref 13.0–17.0)
O2 Saturation: 67 %
O2 Saturation: 70 %
Potassium: 3.7 mmol/L (ref 3.5–5.1)
Potassium: 3.8 mmol/L (ref 3.5–5.1)
Sodium: 139 mmol/L (ref 135–145)
Sodium: 140 mmol/L (ref 135–145)
TCO2: 25 mmol/L (ref 22–32)
TCO2: 26 mmol/L (ref 22–32)
pCO2, Ven: 42.3 mmHg — ABNORMAL LOW (ref 44–60)
pCO2, Ven: 43.9 mmHg — ABNORMAL LOW (ref 44–60)
pH, Ven: 7.36 (ref 7.25–7.43)
pH, Ven: 7.362 (ref 7.25–7.43)
pO2, Ven: 36 mmHg (ref 32–45)
pO2, Ven: 38 mmHg (ref 32–45)

## 2023-08-17 LAB — GLUCOSE, CAPILLARY: Glucose-Capillary: 88 mg/dL (ref 70–99)

## 2023-08-17 SURGERY — RIGHT HEART CATH

## 2023-08-17 MED ORDER — SODIUM CHLORIDE 0.9 % IV SOLN: 250.0000 mL | INTRAVENOUS | Status: DC | PRN

## 2023-08-17 MED ORDER — NITROGLYCERIN 1 MG/10 ML FOR IR/CATH LAB
INTRA_ARTERIAL | Status: AC
  Filled 2023-08-17: qty 10

## 2023-08-17 MED ORDER — SODIUM CHLORIDE 0.9 % IV SOLN
INTRAVENOUS | Status: DC | PRN
  Administered 2023-08-17: 500 mL via INTRAVENOUS

## 2023-08-17 MED ORDER — SODIUM CHLORIDE 0.9% FLUSH: 3.0000 mL | INTRAVENOUS | Status: DC | PRN

## 2023-08-17 MED ORDER — SODIUM CHLORIDE 0.9% FLUSH: 3.0000 mL | Freq: Two times a day (BID) | INTRAVENOUS | Status: DC

## 2023-08-17 MED ORDER — SODIUM CHLORIDE 0.9 % IV SOLN: INTRAVENOUS | Status: DC

## 2023-08-17 MED ORDER — NITROGLYCERIN 1 MG/10 ML FOR IR/CATH LAB
INTRA_ARTERIAL | Status: DC | PRN
  Administered 2023-08-17: 400 ug via INTRA_ARTERIAL

## 2023-08-17 MED ORDER — HEPARIN (PORCINE) IN NACL 1000-0.9 UT/500ML-% IV SOLN
INTRAVENOUS | Status: DC | PRN
  Administered 2023-08-17 (×2): 500 mL

## 2023-08-17 MED ORDER — LIDOCAINE HCL (PF) 1 % IJ SOLN
INTRAMUSCULAR | Status: AC
  Filled 2023-08-17: qty 30

## 2023-08-17 SURGICAL SUPPLY — 5 items
CATH SWAN GANZ 7F STRAIGHT (CATHETERS) IMPLANT
GLIDESHEATH SLENDER 7FR .021G (SHEATH) IMPLANT
GUIDEWIRE .025 260CM (WIRE) IMPLANT
PACK CARDIAC CATHETERIZATION (CUSTOM PROCEDURE TRAY) IMPLANT
SET ATX-X65L (MISCELLANEOUS) IMPLANT

## 2023-08-17 NOTE — Discharge Instructions (Signed)

## 2023-08-17 NOTE — Interval H&P Note (Signed)
History and Physical Interval Note:  08/17/2023 9:16 AM  Nicholas Fleming  has presented today for surgery, with the diagnosis of cad.  The various methods of treatment have been discussed with the patient and family. After consideration of risks, benefits and other options for treatment, the patient has consented to  Procedure(s): RIGHT HEART CATH (N/A) as a surgical intervention.  The patient's history has been reviewed, patient examined, no change in status, stable for surgery.  I have reviewed the patient's chart and labs.  Questions were answered to the patient's satisfaction.     Yates Decamp

## 2023-08-20 NOTE — Progress Notes (Signed)
Kindly inform the patient that both cholesterol profile and screening test for diabetes was satisfactory.

## 2023-08-21 ENCOUNTER — Encounter (HOSPITAL_COMMUNITY): Payer: Self-pay | Admitting: Cardiology

## 2023-08-21 ENCOUNTER — Telehealth: Payer: Self-pay

## 2023-08-21 NOTE — Telephone Encounter (Signed)
-----   Message from Delia Heady sent at 08/20/2023  8:33 PM EDT ----- Nicholas Fleming inform the patient that both cholesterol profile and screening test for diabetes was satisfactory.

## 2023-08-21 NOTE — Telephone Encounter (Signed)
Called patient and spoke with patient son to informed him of patient labs results. Per dr. Pearlean Brownie "Kindly inform the patient that both cholesterol profile and screening test for diabetes was satisfactory." Patient son verbalized understanding. Pt had no questions at this time but was encouraged to call back if questions arise.

## 2023-08-27 ENCOUNTER — Other Ambulatory Visit (HOSPITAL_COMMUNITY): Payer: Self-pay

## 2023-08-27 ENCOUNTER — Encounter: Payer: Self-pay | Admitting: Pulmonary Disease

## 2023-08-27 ENCOUNTER — Telehealth: Payer: Self-pay | Admitting: Pharmacist

## 2023-08-27 DIAGNOSIS — M6281 Muscle weakness (generalized): Secondary | ICD-10-CM | POA: Diagnosis not present

## 2023-08-27 DIAGNOSIS — R262 Difficulty in walking, not elsewhere classified: Secondary | ICD-10-CM | POA: Diagnosis not present

## 2023-08-27 DIAGNOSIS — J84112 Idiopathic pulmonary fibrosis: Secondary | ICD-10-CM

## 2023-08-27 DIAGNOSIS — I2723 Pulmonary hypertension due to lung diseases and hypoxia: Secondary | ICD-10-CM

## 2023-08-27 NOTE — Telephone Encounter (Signed)
Signed provider forms received from Dr. Isaiah Serge. Placed in Tyvaso folder in pharmacy office pending pt signature

## 2023-08-27 NOTE — Telephone Encounter (Signed)
Submitted a Prior Authorization request to [facility] for TYVASO DPI via CoverMyMeds. Will update once we receive a response.  Key: [id number]

## 2023-08-27 NOTE — Progress Notes (Signed)
PCCM note  Right heart cath 08/17/2023 reviewed with Dr. Jacinto Halim and son Dr. Heide Spark RA pressure 0/-3, -4 mmHg.  RA saturation 67%.  RA saturation 67%. RV 35/1, EDP -1 mmHg. PA 35/12, mean 22 mmHg.  PA saturation 70%. PW 4/6, mean 3 mmHg.  Aortic saturation 95%. Pulmonary vascular resistance 4.5 Wood units. QP/previous 1.00.  He has mild pulmonary hypertension and will benefit from Tyvaso Will send message to pharmacy team to initiate application.  Nicholas Greathouse MD [facility] See Amion for pager  If no response to pager , please call [phone] until 7pm After 7:00 pm call Elink  [phone] 08/27/2023, 12:17 PM

## 2023-08-27 NOTE — Telephone Encounter (Signed)
Tyvaso form placed in Dr. Shirlee More mailbox for signature.   Left VM for patient's son, Nischal, regarding patient form that will need to be signed. Will send via Docusign to email address on file  Chesley Mires, PharmD, MPH, BCPS, CPP Clinical Pharmacist (Rheumatology and Pulmonology)

## 2023-08-27 NOTE — Progress Notes (Unsigned)
Will start Tyvaso DPI BIV  Chesley Mires, PharmD, MPH, BCPS, CPP Clinical Pharmacist (Rheumatology and Pulmonology)

## 2023-08-27 NOTE — Telephone Encounter (Signed)
Please start Tyvaso DPI BIV  Patient's son is point of contact for his healthcare  Chesley Mires, PharmD, MPH, BCPS, CPP Clinical Pharmacist (Rheumatology and Pulmonology)

## 2023-08-28 ENCOUNTER — Telehealth (HOSPITAL_COMMUNITY): Payer: Self-pay

## 2023-08-28 NOTE — Telephone Encounter (Signed)
Received notification from [facility] regarding a prior authorization for TYVASO DPI. Authorization has been APPROVED from 12/18/22 to 12/18/23. Approval letter sent to scan center. Attached PA approval to referral form. Pending pt form  Son left VM on office number requesting pt's form be sent via email (Docusign)  Chesley Mires, PharmD, MPH, BCPS, CPP Clinical Pharmacist (Rheumatology and Pulmonology)

## 2023-08-28 NOTE — Telephone Encounter (Signed)
Pt is currently asymptomatic, will do a cta head/neck in 1 year per Dr. Corliss Skains. They will call if he becomes symptomatic for a 6 month f/u instead. AB

## 2023-09-03 NOTE — Telephone Encounter (Signed)
Submitted Tyvaso DPI referral form to [facility] with insurance card copy, PA approval, and clinicals  Phone: [phone] Fax: [phone]

## 2023-09-12 NOTE — Telephone Encounter (Signed)
Per [facility] portal, BIV for Tyvaso remains pending  Chesley Mires, PharmD, MPH, BCPS, CPP Clinical Pharmacist (Rheumatology and Pulmonology)

## 2023-09-18 NOTE — Telephone Encounter (Signed)
Per [facility] portal, benefits verification for Tyvaso DPI has been completed  Next step would be scheduling to pt

## 2023-09-21 NOTE — Telephone Encounter (Signed)
Per [facility] portal, Tyvaso DPI shipment scheduled to arrive at patient's home today. No nursing visit appears to be schedled at this time

## 2023-09-25 MED ORDER — TYVASO DPI TITRATION KIT 16 & 32 & 48 MCG IN POWD
RESPIRATORY_TRACT | Status: DC
Start: 2023-09-25 — End: 2024-08-01

## 2023-09-25 NOTE — Telephone Encounter (Signed)
Spoke to patient's son Nischal about follow up appointment. States patient is going out of country to [place]. Will be back April 2025. Son will call back to schedule patient follow up appointment after April 2025.

## 2023-09-25 NOTE — Telephone Encounter (Signed)
Reviewed nurse home visit note from 09/24/2023 through provider portal. Patient's son Nischal was at visit. Reviewed all aspects of therapy (see visit summary). Reviewed possible side effects and how to mitigate.  Pre therapy BP 101/69, post 5 min 101/72, post 15 min 101/72, post 30 min 104/72, post 60 min 106/73.  Chesley Mires, PharmD, MPH, BCPS, CPP Clinical Pharmacist (Rheumatology and Pulmonology)

## 2023-10-03 ENCOUNTER — Ambulatory Visit: Payer: Medicare HMO | Admitting: Dermatology

## 2023-10-03 DIAGNOSIS — Z79899 Other long term (current) drug therapy: Secondary | ICD-10-CM | POA: Diagnosis not present

## 2023-10-03 DIAGNOSIS — Z7189 Other specified counseling: Secondary | ICD-10-CM | POA: Diagnosis not present

## 2023-10-03 DIAGNOSIS — L409 Psoriasis, unspecified: Secondary | ICD-10-CM

## 2023-10-03 DIAGNOSIS — L0100 Impetigo, unspecified: Secondary | ICD-10-CM | POA: Diagnosis not present

## 2023-10-03 DIAGNOSIS — Z5181 Encounter for therapeutic drug level monitoring: Secondary | ICD-10-CM

## 2023-10-03 MED ORDER — SOTYKTU 6 MG PO TABS: 6.0000 mg | ORAL_TABLET | Freq: Every day | ORAL | 5 refills | Status: DC

## 2023-10-03 MED ORDER — MUPIROCIN 2 % EX OINT
1.0000 | TOPICAL_OINTMENT | Freq: Two times a day (BID) | CUTANEOUS | 0 refills | Status: AC
Start: 2023-10-03 — End: ?

## 2023-10-03 MED ORDER — CEPHALEXIN 500 MG PO CAPS
500.0000 mg | ORAL_CAPSULE | Freq: Two times a day (BID) | ORAL | 0 refills | Status: AC
Start: 2023-10-03 — End: 2023-10-10

## 2023-10-03 NOTE — Patient Instructions (Addendum)

## 2023-10-03 NOTE — Progress Notes (Signed)
Follow-Up Visit   Subjective  Nicholas Fleming is a 77 y.o. male who presents for the following: Psoriasis Patient here today for follow up on psoriasis. Patient currently taking skyrizi injections every 3 months. He reports a recent flare at forehead. He would like to discuss switching to a pill biologic treatment mostly because he travels to [place] frequently and it is difficult to take skyrizi with him.  He is doing well on [employer].  Daughter in law present with him.  The following portions of the chart were reviewed this encounter and updated as appropriate: medications, allergies, medical history  Review of Systems:  No other skin or systemic complaints except as noted in HPI or Assessment and Plan.  Objective  Well appearing patient in no apparent distress; mood and affect are within normal limits.  Areas Examined: Knees, elbows, hands, forehead   Relevant exam findings are noted in the Assessment and Plan.      Assessment & Plan   Impetigo  Exam: crusted areas at forehead  Treatment Plan: Start Cephalexin 500 mg capsule - take 1 capsule bid for 7 days  Start mupirocin 2 % ointment - apply to crusted areas at forehead twice daily until healed.    PSORIASIS on systemic treatment  Exam: Clear at exam today 0% BSA.  Chronic condition with duration or expected duration over one year. Currently well-controlled.   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  Psoriasis is a chronic non-curable, but treatable genetic/hereditary disease that may have other systemic features affecting other organ systems such as joints (Psoriatic Arthritis).  It is linked with heart disease, inflammatory bowel disease, non-alcoholic fatty liver disease, and depression. Significant skin psoriasis and/or psoriatic arthritis may have significant symptoms and affects activities of daily activity and often benefits  from systemic treatments.  These systemic treatments have some potential side effects including immunosuppression and require pre-treatment laboratory screening and periodic laboratory monitoring and periodic in person evaluation and monitoring by the attending dermatologist physician (long term medication management).   Patient denies joint pain  Has Tried and failed otezla due to weight loss side effect Last shingrix vaccine 08/18/2021 Reviewed prior labs 03/2023 -01/2023 CMP, CBC, LIPID, A1C, - normal liver, kidney, lipid  Pending TB QuantiferonGold will continue treatment for Psoriasis with Skyrizi or with oral Sotyktu  Treatment Plan: Patient would like to try pill treatment Stop Skyrizi After finishing antibiotic start Sotyktu samples take 1 po qd.  NDC 1610-96045  Lot Cpcmda1 Exp 04/2025  Submit rx to [facility] Patient advised if not approved to restart Skyrizi   Follow up in 7 months  Reviewed risks of biologics including immunosuppression, infections, injection site reaction, and failure to improve condition. Goal is control of skin condition, not cure.  Some older biologics such as Humira and Enbrel may slightly increase risk of malignancy and may worsen congestive heart failure.  Taltz and Cosentyx may cause inflammatory bowel disease to flare. The use of biologics requires long term medication management, including periodic office visits and monitoring of blood work.   Long term medication management.  Patient is using long term (months to years) prescription medication  to control their dermatologic condition.  These medications require periodic monitoring to evaluate for efficacy and side effects and may require periodic laboratory monitoring.   Return for 7 month psoriasis followup.  Geralynn Rile, CMA, am acting as scribe for Armida Sans, MD.   Documentation: I have  reviewed the above documentation for accuracy and completeness, and I agree with the above.  Armida Sans, MD

## 2023-10-06 LAB — QUANTIFERON-TB GOLD PLUS
QuantiFERON Mitogen Value: >10 [IU]/mL
QuantiFERON Nil Value: 0.1 [IU]/mL
QuantiFERON TB1 Ag Value: 0.09 [IU]/mL
QuantiFERON TB2 Ag Value: 0.14 [IU]/mL
QuantiFERON-TB Gold Plus: NEGATIVE

## 2023-10-08 ENCOUNTER — Other Ambulatory Visit: Payer: Self-pay | Admitting: Internal Medicine

## 2023-10-08 DIAGNOSIS — J84112 Idiopathic pulmonary fibrosis: Secondary | ICD-10-CM

## 2023-10-08 MED ORDER — BENZONATATE 200 MG PO CAPS
200.0000 mg | ORAL_CAPSULE | Freq: Three times a day (TID) | ORAL | 1 refills | Status: DC | PRN
Start: 2023-10-08 — End: 2023-11-09

## 2023-10-08 MED ORDER — GUAIFENESIN-CODEINE 100-10 MG/5ML PO SYRP: 5.0000 mL | ORAL_SOLUTION | Freq: Three times a day (TID) | ORAL | 0 refills | Status: DC | PRN

## 2023-10-09 ENCOUNTER — Telehealth: Payer: Self-pay

## 2023-10-09 NOTE — Telephone Encounter (Signed)
-----   Message from Armida Sans sent at 10/09/2023 11:57 AM EDT ----- Labs from 10/03/2023 showed: TB test / Nicholas Fleming is Negative = normal  May switch from [place] to [employer] treatment for Psoriasis Pt has samples of Sotyktu. If Sotyktu not already sent to Pharmacy, please send.

## 2023-10-09 NOTE — Telephone Encounter (Signed)
Discussed labs results with patient.

## 2023-10-16 ENCOUNTER — Encounter: Payer: Self-pay | Admitting: Dermatology

## 2023-10-16 ENCOUNTER — Other Ambulatory Visit: Payer: Self-pay | Admitting: Internal Medicine

## 2023-10-16 MED ORDER — CLOPIDOGREL BISULFATE 75 MG PO TABS: 75.0000 mg | ORAL_TABLET | Freq: Every day | ORAL | 3 refills | Status: AC

## 2023-10-16 NOTE — Progress Notes (Signed)
Patient has h/o TIAs & CAD with stents, he recently ran out of plavix.  I refilled 90d x3 refills to his [facility] pharmacy in [place]

## 2023-10-22 ENCOUNTER — Telehealth: Payer: Self-pay | Admitting: Pulmonary Disease

## 2023-10-22 DIAGNOSIS — J84112 Idiopathic pulmonary fibrosis: Secondary | ICD-10-CM

## 2023-10-29 ENCOUNTER — Telehealth: Payer: Self-pay

## 2023-10-29 NOTE — Telephone Encounter (Signed)
New fax came in stating Sotyktu approved 12/18/22-12/17/24. aw

## 2023-10-29 NOTE — Telephone Encounter (Signed)
Patient son (number listed on file) advised of approval. aw

## 2023-10-29 NOTE — Telephone Encounter (Signed)
Sotyktu appeal has been denied. Daughter in law has called multiples regarding Sotyktu and states the patient is leaving to go to [place] towards the end of the month. Sotyktu denied because it is not on the patient's formulary.

## 2023-10-31 NOTE — Telephone Encounter (Signed)
LM on son's VM we need to sched a FU. Will try again and close encounter.

## 2023-10-31 NOTE — Telephone Encounter (Signed)
Refill sent for OFEV to [facility] (pulmonary fibrosis team). [phone]  Dose: 150mg  twice daily  Last OV: 02/05/2023 Provider: Dr. Isaiah Serge  Next OV: overdue  Routing to scheduling team for follow-up on appt scheduling  Chesley Mires, PharmD, MPH, BCPS Clinical Pharmacist (Rheumatology and Pulmonology)

## 2023-11-09 ENCOUNTER — Telehealth: Payer: Medicare HMO | Admitting: Pulmonary Disease

## 2023-11-09 DIAGNOSIS — I272 Pulmonary hypertension, unspecified: Secondary | ICD-10-CM | POA: Diagnosis not present

## 2023-11-09 DIAGNOSIS — Z5181 Encounter for therapeutic drug level monitoring: Secondary | ICD-10-CM | POA: Diagnosis not present

## 2023-11-09 DIAGNOSIS — J84112 Idiopathic pulmonary fibrosis: Secondary | ICD-10-CM

## 2023-11-09 MED ORDER — BENZONATATE 200 MG PO CAPS: 200.0000 mg | ORAL_CAPSULE | Freq: Three times a day (TID) | ORAL | 1 refills | Status: DC | PRN

## 2023-11-09 NOTE — Progress Notes (Addendum)
Nicholas Fleming    [id number]    Mar 15, 1946  Primary Care Physician:Fleming, Nicholas Rolling, DO  Referring Physician: Gust Rung, DO [address]  [place],  [place] [address]  Virtual Visit via Video Note  I connected with Nicholas Fleming on 11/21/23 at  1:30 PM EST by a video enabled telemedicine application and verified that I am speaking with the correct person using two identifiers.  Location: Patient: Home Provider: Pulmonary Office   I discussed the limitations of evaluation and management by telemedicine and the availability of in person appointments. The patient expressed understanding and agreed to proceed.  Chief complaint: Follow-up for IPF  HPI: 77 y.o. with history of angina, hypertension, diabetes, hyperlipidemia, psoriasis.  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  Cough is nonproductive in nature associated with mild dyspnea on exertion.  He has history of angina, coronary artery disease with a stent placement many years ago.  He has longstanding psoriasis presenting as rash.  He was on methotrexate for 2 years around 2013 and self discontinued due to concern for side effects.  Recently has been started on [place] with improvement in rash.  He developed COVID-19 infection at the end of December 2020 and treated with monoclonal antibody on 12/29 with improvement in symptoms. Started on Ofev at the end of January 2021.  He is tolerating it well with no GI issues. Evaluated for lung transplant in November 2022 at [facility] and deemed to early for transplant  Evaluated by GI for an unexplained 10 to 20 pound weight loss.  Underwent EGD and colonoscopy in 03/09/20 with findings of benign colon polyps.   Got treated with Paxlovid in  January 2023 after exposure to COVID-19 at home Followed up with Dr. Jacinto Halim on 2023 who noted elevated PA on CT scan however he clinically does not have signs of pulmonary hypertension and pro BNP is negative  Pets: No pets, no birds Occupation: Retired Comptroller Exposures: No known exposures.  No mold, hot tub, Jacuzzi, no down pillows or comforter ILD questionnaire 01/12/2020-negative Smoking history: Used to smoke socially.  Quit many years ago Travel history: Originally from [place].  He had lived in [place] for over 30 years and now spends his time between [place] and [place] Relevant family history: No significant family history of lung disease  Interval history: Continues on [place] without any issue.   He had PFTs earlier this year which showed a reduction in diffusion capacity.  I had a discussion and her son and Dr. Jacinto Halim, cardiology.  Given worsening diffusion capacity we decided to get a right heart cath which showed mild pulmonary hypertension.  He has been started on Tyvaso for this.  He has been taking this medication for the past 2 months and is tolerating it well with no issues such as cough.  He scheduled to go to [place] at the end of this month for 3 months.  Outpatient Encounter Medications as of 11/09/2023  Medication Sig   albuterol (PROVENTIL HFA) 108 (90 Base) MCG/ACT inhaler Inhale 1-2 puffs into the lungs every 6 (six) hours as needed for wheezing or shortness of breath.   benzonatate (TESSALON) 200 MG capsule Take 1 capsule (200 mg total)  by mouth 3 (three) times daily as needed for cough.   budesonide-formoterol (SYMBICORT) 80-4.5 MCG/ACT inhaler Inhale 2 puffs into the lungs 2 (two) times daily.   cholecalciferol (VITAMIN D3) 25 MCG (1000 UNIT) tablet Take 1,000 Units by mouth daily.   clopidogrel (PLAVIX) 75 MG tablet Take 1 tablet (75 mg total) by mouth daily.   Deucravacitinib (SOTYKTU) 6 MG TABS Take 6 mg by mouth daily.   guaiFENesin-codeine (ROBITUSSIN AC) 100-10  MG/5ML syrup Take 5 mLs by mouth 3 (three) times daily as needed for cough.   levothyroxine (SYNTHROID) 50 MCG tablet Take 1 tablet (50 mcg total) by mouth daily.   losartan (COZAAR) 25 MG tablet Take 1 tablet (25 mg total) by mouth every evening.   metoprolol succinate (TOPROL-XL) 50 MG 24 hr tablet Take 1 tablet (50 mg total) by mouth daily. Take with or immediately following a meal.   mupirocin ointment (BACTROBAN) 2 % Apply 1 Application topically 2 (two) times daily. Apply to forehead until healed   Nintedanib (OFEV) 150 MG CAPS Take 1 capsule (150 mg total) by mouth 2 (two) times daily. APPOINTMENT NEEDED FOR FURTHER REFILLS   nystatin (MYCOSTATIN) 100000 UNIT/ML suspension Take 5 mLs by mouth 4 (four) times daily.   pantoprazole (PROTONIX) 40 MG tablet TAKE 1 TABLET BY MOUTH DAILY   risankizumab-rzaa (SKYRIZI) 150 MG/ML SOSY prefilled syringe Inject 1 mL (150 mg total) into the skin as directed. Every 12 weeks for maintenance.   rosuvastatin (CRESTOR) 20 MG tablet Take 1 tablet (20 mg total) by mouth daily.   Treprostinil (TYVASO DPI TITRATION KIT) 16 & 32 & 48 MCG POWD Inhale contents of 1 cartridge four tmies daily. Increase dose every 1-2 weeks as tolerated   [DISCONTINUED] benzonatate (TESSALON) 200 MG capsule Take 1 capsule (200 mg total) by mouth 3 (three) times daily as needed for cough.   No facility-administered encounter medications on file as of 11/09/2023.   Examination: Tele  Data Reviewed: Imaging: High-res CT 11/19/2019-peripheral and basal subpleural reticulation with groundglass, traction bronchiectasis.  Probable honeycombing.  5 mm lung nodule.  Probable UIP pattern  CT abdomen 03/17/2020-stable pulmonary fibrosis at the base.  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 I have reviewed the images personally  PFTs 03/18/2020 FVC 2.20 [60%], FEV1 1.65  [63%],/75, TLC 3.66 [58%], DLCO 12.0 [53%] Severe restriction, moderate-severe diffusion defect.  07/26/2021 FVC 1.48 [41%], FEV1 1.31 [50%], F/F 88, TLC 2.78 [44%], DLCO 10.37 [46%] Severe restriction, severe diffusion defect.  Worsening compared to 2021  PFTs from [facility] 08/19/2021 FVC 1.53 [49%], FEV1 1.24 [51%], F/F 81, TLC 3.59 [56.7%], DLCO 11.45 [51.8%] Moderate obstruction with mild diffusion defect  6-minute walk 01/12/2020-408 m, nadir O2 sat of 92%  Labs: CBC 10/22 -WBC 10.4, eos 6%, absolute eosinophil count 624  CTD serologies 12/02/19-negative  Hepatic panel from [facility] 10/19/2021-within normal limits  Cardiac: Echocardiogram 07/25/2021 Normal LV systolic function with EF 72%, grade 1 diastolic dysfunction, PA pressures not estimated due to absence of TR.  Right heart catheterization 08/17/2023 RA pressure 0/-3, -4 mmHg.  RA saturation 67%.  RA saturation 67%. RV 35/1, EDP -1 mmHg. PA 35/12, mean 22 mmHg.  PA saturation 70%. PW 4/6, mean 3 mmHg.  Aortic saturation 95%. Pulmonary vascular resistance 4.5 Wood units.  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  No known exposures or signs and symptoms of connective tissue disease Serologies  for CTD are negative Reviewed at multidisciplinary conference on 12/30/19.  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  Continues on Ofev.  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  Cough Continue Protonix for treatment of GERD.  Use Delsym, Tessalon  Plan/Recommendations: - Continue Ofev, Tyvaso - Follow hepatic panel  I discussed the assessment and treatment plan with the patient. The patient was provided an opportunity to ask questions  and all were answered. The patient agreed with the plan and demonstrated an understanding of the instructions.   The patient was advised to call back or seek an in-person evaluation if the symptoms worsen or if the condition fails to improve as anticipated.  I discussed the assessment and treatment plan with the patient. The patient was provided an opportunity to ask questions and all were answered. The patient agreed with the plan and demonstrated an understanding of the instructions.   The patient was advised to call back or seek an in-person evaluation if the symptoms worsen or if the condition fails to improve as anticipated.  Chilton Greathouse MD [facility] 11/09/2023, 1:44 PM  CC: Gust Rung, DO

## 2023-12-03 ENCOUNTER — Telehealth: Payer: Self-pay

## 2023-12-03 NOTE — Telephone Encounter (Signed)
PA renewal initiated automatically by CoverMyMeds.  Both medications are approved until 12/18/2023 and cannot be processed on CMM at this time. Will push encounter out to the new year and reattempt.

## 2023-12-26 NOTE — Telephone Encounter (Signed)
 Submitted a Prior Authorization request to [facility] for OFEV  via CoverMyMeds. Will update once we receive a response.  Key: [id number]   Submitted a Prior Authorization request to [facility] for TYVASO  DPI via CoverMyMeds. Will update once we receive a response.  Key: [id number]

## 2023-12-26 NOTE — Telephone Encounter (Signed)
 Received notification from [facility] regarding a prior authorization for OFEV . Authorization has been APPROVED from 12/26/2023 to 12/17/24. Approval letter sent to scan center.  Authorization # [id number]  Received notification from [facility] regarding a prior authorization for TYVASO  DPI. Authorization has been APPROVED from 12/26/2023 to 12/17/24. Approval letter sent to scan center.  Authorization # [id number]  Sherry Pennant, PharmD, MPH, BCPS, CPP Clinical Pharmacist (Rheumatology and Pulmonology)

## 2024-01-14 ENCOUNTER — Other Ambulatory Visit: Payer: Self-pay | Admitting: Pulmonary Disease

## 2024-01-14 DIAGNOSIS — J84112 Idiopathic pulmonary fibrosis: Secondary | ICD-10-CM

## 2024-01-14 NOTE — Telephone Encounter (Signed)
Refill sent for OFEV to [facility] (pulmonary fibrosis team). [phone]  Dose: 150mg  twice daily  Last OV: 11/09/2023 Provider: Dr. Isaiah Serge  Next OV: not yet scheduled (due in April 2025)  Chesley Mires, PharmD, MPH, BCPS Clinical Pharmacist (Rheumatology and Pulmonology)

## 2024-02-12 ENCOUNTER — Other Ambulatory Visit: Payer: Self-pay

## 2024-02-12 MED ORDER — ROSUVASTATIN CALCIUM 20 MG PO TABS: 20.0000 mg | ORAL_TABLET | Freq: Every day | ORAL | 3 refills | Status: AC

## 2024-02-12 NOTE — Telephone Encounter (Signed)
 Medication sent to pharmacy

## 2024-04-03 ENCOUNTER — Other Ambulatory Visit: Payer: Self-pay

## 2024-04-03 DIAGNOSIS — L409 Psoriasis, unspecified: Secondary | ICD-10-CM

## 2024-04-03 MED ORDER — SOTYKTU 6 MG PO TABS
6.0000 mg | ORAL_TABLET | Freq: Every day | ORAL | 1 refills | Status: DC
Start: 2024-04-03 — End: 2024-05-07

## 2024-04-10 ENCOUNTER — Other Ambulatory Visit: Payer: Self-pay | Admitting: Internal Medicine

## 2024-04-10 DIAGNOSIS — J84112 Idiopathic pulmonary fibrosis: Secondary | ICD-10-CM

## 2024-04-10 MED ORDER — GUAIFENESIN-CODEINE 100-10 MG/5ML PO SOLN: 5.0000 mL | Freq: Three times a day (TID) | ORAL | 1 refills | Status: DC | PRN

## 2024-04-10 MED ORDER — BENZONATATE 200 MG PO CAPS: 200.0000 mg | ORAL_CAPSULE | Freq: Three times a day (TID) | ORAL | 1 refills | Status: DC | PRN

## 2024-04-10 NOTE — Progress Notes (Signed)
 Spoke with son, requesting refill of tessalon  and robitussin ac for cough associated with ipf.  We discussed plan for follow up visit in June.

## 2024-04-15 ENCOUNTER — Ambulatory Visit: Admitting: Neurology

## 2024-04-15 ENCOUNTER — Encounter: Payer: Self-pay | Admitting: Neurology

## 2024-04-15 VITALS — BP 133/74 | HR 96 | Ht 66.0 in | Wt 135.8 lb

## 2024-04-15 DIAGNOSIS — G459 Transient cerebral ischemic attack, unspecified: Secondary | ICD-10-CM

## 2024-04-15 NOTE — Patient Instructions (Signed)
 I had a long d/w patient and his son about his remote TIA, moderate right vertebral artery origin stenosis,risk for recurrent stroke/TIAs, personally independently reviewed imaging studies and stroke evaluation results and answered questions.Continue Plavix  75 mg daily  for secondary stroke prevention and maintain strict control of hypertension with blood pressure goal below 130/90, diabetes with hemoglobin A1c goal below 6.5% and lipids with LDL cholesterol goal below 70 mg/dL. I also advised the patient to eat a healthy diet with plenty of whole grains, cereals, fruits and vegetables, exercise regularly and maintain ideal body weight .Check screening carotid ultrasound study. 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

## 2024-04-15 NOTE — Progress Notes (Signed)
 [facility] [address] [place]. [place] [address] [phone]       OFFICE FOLLOW UP VISIT NOTE  [name] Date of Birth:  07/29/1946 Medical Record Number:  [id number]   Referring MD: Dr. Sharia Daunt  Reason for Referral: TIA  HPI: Initial visit 10/18/2022: Nicholas Fleming is a pleasant 78 year old [place] male seen for initial office consultation visit for TIA.  He is accompanied by his daughter-in-law.  History is obtained from them and review of electronic medical records.  I personally reviewed pertinent available imaging films in PACS.  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  He had somewhat similar episode a year ago which lasted 30 minutes and resolved and at that time he has not sought medical help.  Patient was seen by me on telemetry neurology consultation for symptoms have resolved.  CT head was unremarkable CT angiogram showed no large vessel stenosis or occlusion.  MRI scan of the brain was obtained the next day which showed no infarct or abnormality.  Transthoracic echo showed ejection fraction of 55 to 60% without significant wall motion abnormalities.  Left atrium size was moderately dilated.  Agitated saline contrast bubble study was negative.  LDL cholesterol 47 mg percent.  Hemoglobin A1c 9.  Patient was started on dual antiplatelet therapy aspirin  and Plavix  for 3 weeks.  Completed continue on Plavix  alone.  He states is done well.  He has had no recurrent episodes of dizziness or any other TIA or strokelike symptoms.  He is tolerating Plavix  well without significant bruising or bleeding or other side effects.  He is also on tolerating well without he has no complaints today.  He plans  to travel to [place] soon to spend his winter there. Update  08/07/2023 : He returns for follow-up after last visit with me 9 months ago.  He is accompanied by his daughter-in-law.  Patient states he was doing fine until December 2023 when he was visiting [place].  Similar episode of sudden onset of dizziness and feeling off balance and leaning to the right.  He denied any accompanying headache, vertigo, blurred vision.  He did have some nausea.  He denied extremity weakness or numbness.  The episode lasted about 30 minutes.  He did not seek medical help right away.  He went and saw a cardiologist the next day and had some testing done but did not get any brain or neurovascular imaging done.  He mentioned this to his son after return and was advised to see me.  He denies any other recurrent stroke or TIA symptoms.  He denies any ringing in his ears, decreased hearing or vertigo.  He denies positional vertigo.  He remains on Plavix  which is tolerating well with minor bruising and no bleeding.  He states his blood pressure is under good control.  He is tolerating Crestor  well without muscle aches and pains but has not had any recent lipid profile checked.  Last lipid profile on 08/25/2022 showed LDL to be 47 mg percent and hemoglobin A1c 5.8.  He did undergo diagnostic cerebral catheter angiogram by Dr. Alvira Josephs on 01/18/2023 which had shown only 50% narrowing of the right vertebral artery origin and was conservative medical therapy was recommended.  Repeat CT angiogram has been ordered and is scheduled  for next week. Update 04/15/2024 : He returns for follow-up after last visit 8 months ago.  He is accompanied by his son.  He just returned from a trip to [place] 2 weeks ago.  He states he is doing fine.  He has had no recurrent TIA or stroke symptoms.  He is learned to get up slowly and avoid sudden movements.  Does not have dizzy spells either.  He is tolerating Plavix  well without bruising or bleeding.  His blood pressure is under good control today it is 133/74.  He is tolerating  Crestor  well without muscle aches and pain.  He states he had lipid profile checked a few months ago by his primary care physician and it was satisfactory.  He is living at home and is independent in all activities of daily living.  He denies memory or cognitive issues.  He has no complaints.  CT angiogram of the head and neck on 08/16/2023 showed moderate right vertebral arteries origin and right ICA anterior genu of stenosis which was stable. ROS:   14 system review of systems is positive for dizziness, imbalance, leaning to 1 side and nausea and all other systems negative  PMH:  Past Medical History:  Diagnosis Date   Allergy    Asthma    Bronchitis    Controlled type 2 diabetes mellitus (HCC) 10/09/2019   Essential hypertension 10/09/2019   Hypothyroidism 10/09/2019   Psoriasis     Social History:  Social History   Socioeconomic History   Marital status: Married    Spouse name: Not on file   Number of children: 2   Years of education: Not on file   Highest education level: Not on file  Occupational History   Not on file  Tobacco Use   Smoking status: Former    Current packs/day: 0.00    Average packs/day: 0.3 packs/day for 1 year (0.3 ttl pk-yrs)    Types: Cigarettes    Start date: 20    Quit date: 1964    Years since quitting: 61.3   Smokeless tobacco: Never  Vaping Use   Vaping status: Never Used  Substance and Sexual Activity   Alcohol use: Yes    Comment: 1 to2 times per week   Drug use: Never   Sexual activity: Not on file  Other Topics Concern   Not on file  Social History Narrative   Pt lives with family    Retired    Chief Executive Officer Drivers of Corporate investment banker Strain: Not on file  Food Insecurity: No Food Insecurity (09/13/2022)   Hunger Vital Sign    Worried About Running Out of Food in the Last Year: Never true    Ran Out of Food in the Last Year: Never true  Transportation Needs: No Transportation Needs (09/13/2022)   PRAPARE - Therapist, art (Medical): No    Lack of Transportation (Non-Medical): No  Physical Activity: Not on file  Stress: Not on file  Social Connections: Not on file  Intimate Partner Violence: Not on file    Medications:   Current Outpatient Medications on File Prior to Visit  Medication Sig Dispense Refill   albuterol  (PROVENTIL  HFA) 108 (90 Base) MCG/ACT inhaler Inhale 1-2 puffs into the lungs every 6 (six) hours as needed for wheezing or shortness of breath. 18 g 3   benzonatate  (TESSALON ) 200 MG capsule Take 1 capsule (200 mg total) by mouth 3 (three) times daily as needed for  cough. 90 capsule 1   budesonide -formoterol  (SYMBICORT ) 80-4.5 MCG/ACT inhaler Inhale 2 puffs into the lungs 2 (two) times daily. 30.6 g 3   cholecalciferol  (VITAMIN D3) 25 MCG (1000 UNIT) tablet Take 1,000 Units by mouth daily.     clopidogrel  (PLAVIX ) 75 MG tablet Take 1 tablet (75 mg total) by mouth daily. 90 tablet 3   Deucravacitinib  (SOTYKTU ) 6 MG TABS Take 6 mg by mouth daily. 30 tablet 1   guaiFENesin -codeine  100-10 MG/5ML syrup Take 5 mLs by mouth 3 (three) times daily as needed for cough. 120 mL 1   levothyroxine  (SYNTHROID ) 50 MCG tablet Take 1 tablet (50 mcg total) by mouth daily. 90 tablet 3   metoprolol  succinate (TOPROL -XL) 50 MG 24 hr tablet Take 1 tablet (50 mg total) by mouth daily. Take with or immediately following a meal. 90 tablet 3   mupirocin  ointment (BACTROBAN ) 2 % Apply 1 Application topically 2 (two) times daily. Apply to forehead until healed 22 g 0   nystatin (MYCOSTATIN) 100000 UNIT/ML suspension Take 5 mLs by mouth 4 (four) times daily.     OFEV  150 MG CAPS TAKE 1 CAPSULE TWICE A DAY (APPOINTMENT NEEDED FOR FURTHER REFILLS) 180 capsule 1   pantoprazole  (PROTONIX ) 40 MG tablet TAKE 1 TABLET BY MOUTH DAILY 90 tablet 3   risankizumab -rzaa (SKYRIZI ) 150 MG/ML SOSY prefilled syringe Inject 1 mL (150 mg total) into the skin as directed. Every 12 weeks for maintenance. 1 mL 1    rosuvastatin  (CRESTOR ) 20 MG tablet Take 1 tablet (20 mg total) by mouth daily. 90 tablet 3   Treprostinil  (TYVASO  DPI TITRATION KIT) 16 & 32 & 48 MCG POWD Inhale contents of 1 cartridge four tmies daily. Increase dose every 1-2 weeks as tolerated     losartan  (COZAAR ) 25 MG tablet Take 1 tablet (25 mg total) by mouth every evening. 90 tablet 3   No current facility-administered medications on file prior to visit.    Allergies:  No Known Allergies  Physical Exam General: well developed, well nourished pleasant elderly [place] male, seated, in no evident distress Head: head normocephalic and atraumatic.   Neck: supple with no carotid or supraclavicular bruits Cardiovascular: regular rate and rhythm, no murmurs Musculoskeletal: no deformity Skin:  no rash/petichiae Vascular:  Normal pulses all extremities  Neurologic Exam Mental Status: Awake and fully alert. Oriented to place and time. Recent and remote memory intact. Attention span, concentration and fund of knowledge appropriate. Mood and affect appropriate.  Cranial Nerves: Fundoscopic exam not done. Pupils equal, briskly reactive to light. Extraocular movements full without nystagmus. Visual fields full to confrontation. Hearing intact. Facial sensation intact. Face, tongue, palate moves normally and symmetrically.  Motor: Normal bulk and tone. Normal strength in all tested extremity muscles. Sensory.: intact to touch , pinprick , position and vibratory sensation.  Coordination: Rapid alternating movements normal in all extremities. Finger-to-nose and heel-to-shin performed accurately bilaterally.  Gait and Station: Arises from chair without difficulty. Stance is normal. Gait demonstrates normal stride length and balance . Able to heel, toe and tandem walk with slight  difficulty.  Reflexes: 1+ and symmetric. Toes downgoing.     ASSESSMENT:78 year old [place] origin male with sudden onset of dizziness, vomiting  and leaning to the right on 09/13/2022 likely due to posterior circulation TIA versus small infarct not visualized on MRI.  Repeat similar episode in December 2023 also possibly TIA though physical exam does suggest some right-sided peripheral vestibular dysfunction.  Vascular risk factors  of hyperlipidemia moderate right vertebral origin stenosis and hypertension .     PLAN: I had a long d/w patient and his son about his remote TIA, moderate right vertebral artery origin stenosis,risk for recurrent stroke/TIAs, personally independently reviewed imaging studies and stroke evaluation results and answered questions.Continue Plavix  75 mg daily  for secondary stroke prevention and maintain strict control of hypertension with blood pressure goal below 130/90, diabetes with hemoglobin A1c goal below 6.5% and lipids with LDL cholesterol goal below 70 mg/dL. I also advised the patient to eat a healthy diet with plenty of whole grains, cereals, fruits and vegetables, exercise regularly and maintain ideal body weight .continue conservative medical management for his moderate right vertebral origin stenosis and if continues to have recurrent posterior circulation symptoms may consider endovascular treatment in the future.  Check screening carotid ultrasound study. 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  Greater than 50% time during this 35-minute   visit was spent on counseling and coordination of care about his TIA and discussion about TIA and stroke prevention and treatment and answering questions.  Ardella Beaver, MD  Note: This document was prepared with digital dictation and possible smart phrase technology. Any transcriptional errors that result from this process are unintentional.

## 2024-04-17 ENCOUNTER — Ambulatory Visit: Payer: Medicare HMO | Admitting: Neurology

## 2024-05-07 ENCOUNTER — Other Ambulatory Visit: Payer: Self-pay

## 2024-05-07 ENCOUNTER — Ambulatory Visit: Attending: Internal Medicine

## 2024-05-07 ENCOUNTER — Other Ambulatory Visit: Payer: Self-pay | Admitting: Dermatology

## 2024-05-07 DIAGNOSIS — L409 Psoriasis, unspecified: Secondary | ICD-10-CM

## 2024-05-07 DIAGNOSIS — G459 Transient cerebral ischemic attack, unspecified: Secondary | ICD-10-CM | POA: Diagnosis not present

## 2024-05-07 MED ORDER — SOTYKTU 6 MG PO TABS: 6.0000 mg | ORAL_TABLET | Freq: Every day | ORAL | 5 refills | Status: DC

## 2024-05-07 NOTE — Progress Notes (Signed)
 Pharmacy requesting refill of Sotyktu 

## 2024-05-08 ENCOUNTER — Ambulatory Visit: Payer: Self-pay | Admitting: Neurology

## 2024-05-08 NOTE — Progress Notes (Signed)
Kindly inform the patient that carotid ultrasound study shows no significant blockages of either carotid arteries in the neck.

## 2024-05-10 ENCOUNTER — Other Ambulatory Visit: Payer: Self-pay | Admitting: Cardiology

## 2024-05-10 DIAGNOSIS — Z8673 Personal history of transient ischemic attack (TIA), and cerebral infarction without residual deficits: Secondary | ICD-10-CM

## 2024-05-10 DIAGNOSIS — I2510 Atherosclerotic heart disease of native coronary artery without angina pectoris: Secondary | ICD-10-CM

## 2024-05-13 ENCOUNTER — Encounter: Payer: Self-pay | Admitting: Dermatology

## 2024-05-13 ENCOUNTER — Other Ambulatory Visit: Payer: Self-pay

## 2024-05-13 ENCOUNTER — Ambulatory Visit: Admitting: Dermatology

## 2024-05-13 DIAGNOSIS — Z7189 Other specified counseling: Secondary | ICD-10-CM

## 2024-05-13 DIAGNOSIS — L578 Other skin changes due to chronic exposure to nonionizing radiation: Secondary | ICD-10-CM

## 2024-05-13 DIAGNOSIS — W908XXA Exposure to other nonionizing radiation, initial encounter: Secondary | ICD-10-CM | POA: Diagnosis not present

## 2024-05-13 DIAGNOSIS — Z79899 Other long term (current) drug therapy: Secondary | ICD-10-CM

## 2024-05-13 DIAGNOSIS — L821 Other seborrheic keratosis: Secondary | ICD-10-CM

## 2024-05-13 DIAGNOSIS — Z1283 Encounter for screening for malignant neoplasm of skin: Secondary | ICD-10-CM

## 2024-05-13 DIAGNOSIS — L409 Psoriasis, unspecified: Secondary | ICD-10-CM

## 2024-05-13 MED ORDER — METOPROLOL SUCCINATE ER 50 MG PO TB24: 50.0000 mg | ORAL_TABLET | Freq: Every day | ORAL | 3 refills | Status: AC

## 2024-05-13 NOTE — Telephone Encounter (Signed)
 Medication sent to pharmacy

## 2024-05-13 NOTE — Patient Instructions (Addendum)
 Need these labs Cmp and Cbc with diff and lipid panel in July with your primary doctor     Seborrheic Keratosis  What causes seborrheic keratoses? Seborrheic keratoses are harmless, common skin growths that first appear during adult life.  As time goes by, more growths appear.  Some people may develop a large number of them.  Seborrheic keratoses appear on both covered and uncovered body parts.  They are not caused by sunlight.  The tendency to develop seborrheic keratoses can be inherited.  They vary in color from skin-colored to gray, brown, or even black.  They can be either smooth or have a rough, warty surface.   Seborrheic keratoses are superficial and look as if they were stuck on the skin.  Under the microscope this type of keratosis looks like layers upon layers of skin.  That is why at times the top layer may seem to fall off, but the rest of the growth remains and re-grows.    Treatment Seborrheic keratoses do not need to be treated, but can easily be removed in the office.  Seborrheic keratoses often cause symptoms when they rub on clothing or jewelry.  Lesions can be in the way of shaving.  If they become inflamed, they can cause itching, soreness, or burning.  Removal of a seborrheic keratosis can be accomplished by freezing, burning, or surgery. If any spot bleeds, scabs, or grows rapidly, please return to have it checked, as these can be an indication of a skin cancer.   Melanoma ABCDEs  Melanoma is the most dangerous type of skin cancer, and is the leading cause of death from skin disease.  You are more likely to develop melanoma if you: Have light-colored skin, light-colored eyes, or red or blond hair Spend a lot of time in the sun Tan regularly, either outdoors or in a tanning bed Have had blistering sunburns, especially during childhood Have a close family member who has had a melanoma Have atypical moles or large birthmarks  Early detection of melanoma is key since  treatment is typically straightforward and cure rates are extremely high if we catch it early.   The first sign of melanoma is often a change in a mole or a new dark spot.  The ABCDE system is a way of remembering the signs of melanoma.  A for asymmetry:  The two halves do not match. B for border:  The edges of the growth are irregular. C for color:  A mixture of colors are present instead of an even brown color. D for diameter:  Melanomas are usually (but not always) greater than 6mm - the size of a pencil eraser. E for evolution:  The spot keeps changing in size, shape, and color.  Please check your skin once per month between visits. You can use a small mirror in front and a large mirror behind you to keep an eye on the back side or your body.   If you see any new or changing lesions before your next follow-up, please call to schedule a visit.  Please continue daily skin protection including broad spectrum sunscreen SPF 30+ to sun-exposed areas, reapplying every 2 hours as needed when you're outdoors.   Staying in the shade or wearing long sleeves, sun glasses (UVA+UVB protection) and wide brim hats (4-inch brim around the entire circumference of the hat) are also recommended for sun protection.      Due to recent changes in healthcare laws, you may see results of your pathology  and/or laboratory studies on MyChart before the doctors have had a chance to review them. We understand that in some cases there may be results that are confusing or concerning to you. Please understand that not all results are received at the same time and often the doctors may need to interpret multiple results in order to provide you with the best plan of care or course of treatment. Therefore, we ask that you please give us  2 business days to thoroughly review all your results before contacting the office for clarification. Should we see a critical lab result, you will be contacted sooner.   If You Need Anything  After Your Visit  If you have any questions or concerns for your doctor, please call our main line at [phone] and press option 4 to reach your doctor's medical assistant. If no one answers, please leave a voicemail as directed and we will return your call as soon as possible. Messages left after 4 pm will be answered the following business day.   You may also send us  a message via MyChart. We typically respond to MyChart messages within 1-2 business days.  For prescription refills, please ask your pharmacy to contact our office. Our fax number is [phone].  If you have an urgent issue when the clinic is closed that cannot wait until the next business day, you can page your doctor at the number below.    Please note that while we do our best to be available for urgent issues outside of office hours, we are not available 24/7.   If you have an urgent issue and are unable to reach us , you may choose to seek medical care at your doctor's office, retail clinic, urgent care center, or emergency room.  If you have a medical emergency, please immediately call 911 or go to the emergency department.  Pager Numbers  - Dr. Bary Likes: [phone]  - Dr. Annette Barters: [phone]  - Dr. Felipe Horton: [phone]   In the event of inclement weather, please call our main line at [phone] for an update on the status of any delays or closures.  Dermatology Medication Tips: Please keep the boxes that topical medications come in in order to help keep track of the instructions about where and how to use these. Pharmacies typically print the medication instructions only on the boxes and not directly on the medication tubes.   If your medication is too expensive, please contact our office at [phone] option 4 or send us  a message through MyChart.   We are unable to tell what your co-pay for medications will be in advance as this is different depending on your insurance coverage. However, we may be  able to find a substitute medication at lower cost or fill out paperwork to get insurance to cover a needed medication.   If a prior authorization is required to get your medication covered by your insurance company, please allow us  1-2 business days to complete this process.  Drug prices often vary depending on where the prescription is filled and some pharmacies may offer cheaper prices.  The website www.goodrx.com contains coupons for medications through different pharmacies. The prices here do not account for what the cost may be with help from insurance (it may be cheaper with your insurance), but the website can give you the price if you did not use any insurance.  - You can print the associated coupon and take it with your prescription to the pharmacy.  - You may also stop by  our office during regular business hours and pick up a GoodRx coupon card.  - If you need your prescription sent electronically to a different pharmacy, notify our office through [facility] or by phone at [phone] option 4.     Si Usted Necesita Algo Despus de Su Visita  Tambin puede enviarnos un mensaje a travs de Clinical cytogeneticist. Por lo general respondemos a los mensajes de MyChart en el transcurso de 1 a 2 das hbiles.  Para renovar recetas, por favor pida a su farmacia que se ponga en contacto con nuestra oficina. Franz Jacks de fax es Seth Ward [phone].  Si tiene un asunto urgente cuando la clnica est cerrada y que no puede esperar hasta el siguiente da hbil, puede llamar/localizar a su doctor(a) al nmero que aparece a continuacin.   Por favor, tenga en cuenta que aunque hacemos todo lo posible para estar disponibles para asuntos urgentes fuera del horario de [place], no estamos disponibles las 24 horas del da, los 7 [address]  [address] de la [place].   Si tiene un problema urgente y no puede comunicarse con nosotros, puede optar por buscar atencin mdica  en el consultorio de su doctor(a), en una clnica  privada, en un centro de atencin urgente o en una sala de emergencias.  Si tiene Engineer, drilling, por favor llame inmediatamente al 911 o vaya a la sala de emergencias.  Nmeros de bper  - Dr. Bary Likes: [phone]  - Dra. Annette Barters: [phone]  - Dr. Felipe Horton: [phone]   En caso de inclemencias del tiempo, por favor llame a Lajuan Pila principal al [phone] para una actualizacin sobre el [place] de cualquier retraso o cierre.  Consejos para la medicacin en dermatologa: Por favor, guarde las cajas en las que vienen los medicamentos de uso tpico para ayudarle a seguir las instrucciones sobre dnde y cmo usarlos. Las farmacias generalmente imprimen las instrucciones del medicamento slo en las cajas y no directamente en los tubos del [place].   Si su medicamento es muy caro, por favor, pngase en contacto con Bettyjane Brunet llamando al [phone] y presione la opcin 4 o envenos un mensaje a travs de Clinical cytogeneticist.   No podemos decirle cul ser su copago por los medicamentos por adelantado ya que esto es diferente dependiendo de la cobertura de su seguro. Sin embargo, es posible que podamos encontrar un medicamento sustituto a Audiological scientist un formulario para que el seguro cubra el medicamento que se considera necesario.   Si se requiere una autorizacin previa para que su compaa de seguros [place] su medicamento, por favor permtanos de 1 a 2 das hbiles para completar este proceso.  Los precios de los medicamentos varan con frecuencia dependiendo del Environmental consultant de dnde se surte la receta y alguna farmacias pueden ofrecer precios ms baratos.  El sitio web www.goodrx.com tiene cupones para medicamentos de Health and safety inspector. Los precios aqu no tienen en cuenta lo que podra costar con la ayuda del seguro (puede ser ms barato con su seguro), pero el sitio web puede darle el precio si no utiliz Tourist information centre manager.  - Puede imprimir el cupn correspondiente y  llevarlo con su receta a la farmacia.  - Tambin puede pasar por nuestra oficina durante el horario de atencin regular y Education officer, museum una tarjeta de cupones de GoodRx.  - Si necesita que su receta se enve electrnicamente a una farmacia diferente, informe a nuestra oficina a travs de MyChart de [place] o por telfono llamando al [phone] y presione la opcin 4.

## 2024-05-13 NOTE — Progress Notes (Signed)
 Follow-Up Visit   Subjective  Nicholas Fleming is a 78 y.o. male who presents for the following: Skin Cancer Screening and Upper Body Skin Exam And 6 month psoriasis follow up patient is doing well on sotyktu   The patient presents for Upper Body Skin Exam (UBSE) for skin cancer screening and mole check. The patient has spots, moles and lesions to be evaluated, some may be new or changing and the patient may have concern these could be cancer.  The following portions of the chart were reviewed this encounter and updated as appropriate: medications, allergies, medical history  Review of Systems:  No other skin or systemic complaints except as noted in HPI or Assessment and Plan.  Objective  Well appearing patient in no apparent distress; mood and affect are within normal limits.  All skin waist up examined. Relevant physical exam findings are noted in the Assessment and Plan.   Assessment & Plan   Skin cancer screening performed today.  Actinic Damage - Chronic condition, secondary to cumulative UV/sun exposure - diffuse scaly erythematous macules with underlying dyspigmentation - Recommend daily broad spectrum sunscreen SPF 30+ to sun-exposed areas, reapply every 2 hours as needed.  - Staying in the shade or wearing long sleeves, sun glasses (UVA+UVB protection) and wide brim hats (4-inch brim around the entire circumference of the hat) are also recommended for sun protection.  - Call for new or changing lesions.   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 Currently well-controlled. 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  Psoriasis is  a chronic non-curable, but treatable genetic/hereditary disease that may have other systemic features affecting other organ systems such as joints (Psoriatic Arthritis).  It is linked with heart disease, inflammatory bowel disease, non-alcoholic fatty liver disease, and depression. Significant skin psoriasis and/or psoriatic arthritis may have significant symptoms and affects activities of daily activity and often benefits from systemic treatments.  These systemic treatments have some potential side effects including immunosuppression and require pre-treatment laboratory screening and periodic laboratory monitoring and periodic in person evaluation and monitoring by the attending dermatologist physician (long term medication management).   Patient denies joint pain  Reviewed prior labs from 09/2023 TB Quantiferon Gold - normal  Discussed need CMP, CBC with Diff, and Lipid Panel - patient will get with his next follow up in July with Primary Doctor  Will collect Tb Quantiferon Gold next 6 month follow up Last shingrix vaccine 08/18/2021 Treatment Plan: Continue Sotyktu  6 mg tab - take 1 tab by mouth daily  Reviewed risks of biologics including immunosuppression, infections, injection site reaction, and failure to improve condition. Goal is control of skin condition, not cure.  Some older biologics such as Humira and Enbrel may slightly increase risk of malignancy and may worsen congestive heart failure.  Taltz and Cosentyx may cause inflammatory bowel disease to flare. The use of biologics requires long term medication management, including periodic office visits and monitoring of blood work.   Long term medication management.  Patient is using long term (months to years) prescription medication  to control their dermatologic condition.  These medications require periodic monitoring to evaluate for efficacy and side effects and may require periodic  laboratory monitoring.   RTC 6 mos  I, Randee Busing, CMA, am acting as scribe for Celine Collard, MD.   Documentation: I have reviewed the above documentation for accuracy and completeness, and I agree with the above.  Celine Collard, MD

## 2024-05-14 ENCOUNTER — Ambulatory Visit: Payer: Medicare HMO | Admitting: Dermatology

## 2024-05-19 ENCOUNTER — Ambulatory Visit: Payer: Medicare HMO | Admitting: Cardiology

## 2024-06-09 ENCOUNTER — Other Ambulatory Visit: Payer: Self-pay

## 2024-06-09 DIAGNOSIS — Z8673 Personal history of transient ischemic attack (TIA), and cerebral infarction without residual deficits: Secondary | ICD-10-CM

## 2024-06-09 DIAGNOSIS — I2510 Atherosclerotic heart disease of native coronary artery without angina pectoris: Secondary | ICD-10-CM

## 2024-06-09 MED ORDER — LOSARTAN POTASSIUM 25 MG PO TABS: 25.0000 mg | ORAL_TABLET | Freq: Every evening | ORAL | 0 refills | Status: DC

## 2024-06-14 ENCOUNTER — Encounter (HOSPITAL_COMMUNITY): Payer: Self-pay | Admitting: Interventional Radiology

## 2024-06-16 ENCOUNTER — Other Ambulatory Visit: Payer: Self-pay

## 2024-06-16 ENCOUNTER — Other Ambulatory Visit: Payer: Self-pay | Admitting: Pulmonary Disease

## 2024-06-16 MED ORDER — BUDESONIDE-FORMOTEROL FUMARATE 80-4.5 MCG/ACT IN AERO: 2.0000 | INHALATION_SPRAY | Freq: Two times a day (BID) | RESPIRATORY_TRACT | 3 refills | Status: AC

## 2024-06-16 NOTE — Telephone Encounter (Signed)
 Dr. Waylan Haggard, please advise on refill. Thanks

## 2024-06-27 ENCOUNTER — Other Ambulatory Visit: Payer: Self-pay | Admitting: Pulmonary Disease

## 2024-06-27 DIAGNOSIS — J84112 Idiopathic pulmonary fibrosis: Secondary | ICD-10-CM

## 2024-06-27 NOTE — Telephone Encounter (Signed)
 Refill sent for OFEV  to [facility] (pulmonary fibrosis team). [phone]  Dose: 150mg  twice daily  Last OV: 11/09/2023 Provider: Dr. Theophilus Next OV: 07/16/2024  Routing to scheduling team for follow-up on appt scheduling  Sherry Pennant, PharmD, MPH, BCPS Clinical Pharmacist (Rheumatology and Pulmonology)

## 2024-07-14 ENCOUNTER — Other Ambulatory Visit: Payer: Self-pay

## 2024-07-14 MED ORDER — LEVOTHYROXINE SODIUM 50 MCG PO TABS: 50.0000 ug | ORAL_TABLET | Freq: Every day | ORAL | 3 refills | Status: AC

## 2024-07-14 NOTE — Telephone Encounter (Signed)
 Medication sent to pharmacy

## 2024-07-16 ENCOUNTER — Ambulatory Visit: Payer: Self-pay | Admitting: Pulmonary Disease

## 2024-07-16 ENCOUNTER — Encounter: Payer: Self-pay | Admitting: Pulmonary Disease

## 2024-07-16 ENCOUNTER — Ambulatory Visit: Admitting: Pulmonary Disease

## 2024-07-16 VITALS — BP 100/70 | HR 74 | Temp 97.6°F | Ht 66.0 in | Wt 141.0 lb

## 2024-07-16 DIAGNOSIS — E039 Hypothyroidism, unspecified: Secondary | ICD-10-CM | POA: Diagnosis not present

## 2024-07-16 DIAGNOSIS — J849 Interstitial pulmonary disease, unspecified: Secondary | ICD-10-CM

## 2024-07-16 DIAGNOSIS — J84112 Idiopathic pulmonary fibrosis: Secondary | ICD-10-CM | POA: Diagnosis not present

## 2024-07-16 DIAGNOSIS — I2723 Pulmonary hypertension due to lung diseases and hypoxia: Secondary | ICD-10-CM | POA: Diagnosis not present

## 2024-07-16 DIAGNOSIS — Z5181 Encounter for therapeutic drug level monitoring: Secondary | ICD-10-CM

## 2024-07-16 LAB — COMPREHENSIVE METABOLIC PANEL WITH GFR
ALT: 14 U/L (ref 0–53)
AST: 24 U/L (ref 0–37)
Albumin: 3.8 g/dL (ref 3.5–5.2)
Alkaline Phosphatase: 41 U/L (ref 39–117)
BUN: 8 mg/dL (ref 6–23)
CO2: 31 meq/L (ref 19–32)
Calcium: 8.8 mg/dL (ref 8.4–10.5)
Chloride: 101 meq/L (ref 96–112)
Creatinine, Ser: 0.75 mg/dL (ref 0.40–1.50)
GFR: 86.3 mL/min (ref >60.00)
Glucose, Bld: 119 mg/dL — ABNORMAL HIGH (ref 70–99)
Potassium: 4.6 meq/L (ref 3.5–5.1)
Sodium: 137 meq/L (ref 135–145)
Total Bilirubin: 0.6 mg/dL (ref 0.2–1.2)
Total Protein: 6.3 g/dL (ref 6.0–8.3)

## 2024-07-16 NOTE — Patient Instructions (Signed)
 VISIT SUMMARY:  You had a follow-up appointment to discuss your pulmonary fibrosis, medication management, and overall health. You reported improved stamina and physical activity since starting Tyvaso , although you still experience a persistent cough, especially with exertion. We also reviewed your current medications and discussed the need for updated pulmonary tests.  YOUR PLAN:  -IDIOPATHIC PULMONARY FIBROSIS: Idiopathic pulmonary fibrosis is a lung disease that causes scarring of the lungs, leading to breathing difficulties. You are currently on Tyvaso , which has improved your stamina and exercise capacity. We need to update your chest CT and pulmonary function tests to monitor your condition. We also discussed potential new medications for IPF that are still in testing phases.  -COUGH ASSOCIATED WITH IDIOPATHIC PULMONARY FIBROSIS AND TYVASO  THERAPY: Your persistent cough is likely due to both your lung condition and the Tyvaso  therapy. It is more noticeable with physical exertion. You are managing it with codeine  cough syrup. We discussed the option of pulmonary rehabilitation, including virtual rehab through the [employer] application, which may fit better with your schedule.  -GASTROESOPHAGEAL REFLUX DISEASE: Gastroesophageal reflux disease (GERD) is a condition where stomach acid frequently flows back into the tube connecting your mouth and stomach. You are managing this with pantoprazole , and you should continue taking it as prescribed.  INSTRUCTIONS:  Please schedule a chest CT and pulmonary function tests (PFTs) at [facility]. Continue taking your current medications as prescribed. Consider exploring pulmonary rehabilitation options, including the virtual rehab through the [employer] application. We will discuss the results and any new treatment options at your next visit.

## 2024-07-16 NOTE — Progress Notes (Signed)
 Kentley Blyden    [id number]    [birth year]  Primary Care Physician:Hoffman, Dayton BROCKS, DO  Referring Physician: Rosan [place] BROCKS, DO [address] [place], [address] [place],  [place] [address]   Chief complaint:  Follow-up for IPF, pulmonary HTN On Ofev  from January 2021 On Tyvaso  from December 2020 for  HPI: 78 y.o. with history of angina, hypertension, diabetes, hyperlipidemia, psoriasis.  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  Cough is nonproductive in nature associated with mild dyspnea on exertion.  He has history of angina, coronary artery disease with a stent placement many years ago.  He has longstanding psoriasis presenting as rash.  He was on methotrexate for 2 years around 2013 and self discontinued due to concern for side effects.  Recently has been started on Otezla  with improvement in rash.  He developed COVID-19 infection at the end of December 2020 and treated with monoclonal antibody on 12/29 with improvement in symptoms. Started on Ofev  at the end of January 2021.  He is tolerating it well with no GI issues. Evaluated for lung transplant in November 2022 at [facility] and deemed to early for transplant  Evaluated by GI for an unexplained 10 to 20 pound weight loss.  Underwent EGD and colonoscopy in 03/09/20 with findings of benign colon polyps.   Got treated with Paxlovid  in January 2023 after exposure to COVID-19 at home  Had a right heart catheterization on 08/17/2023 with findings of mild pulmonary hypertension and was started on inhaled Tyvaso   Interval history: Discussed the use of AI scribe software for clinical note transcription with the patient, who gave verbal consent to proceed.  History of Present Illness Derak Degroote  is a 78 year old male with pulmonary fibrosis who presents for follow-up regarding his condition and medication management. He is accompanied by his wife.  Dyspnea and exercise tolerance - Pulmonary fibrosis with persistent cough, more pronounced with exertion such as climbing stairs, subsiding after exertion - Improved stamina and ability to engage in more physical activity since starting Tyvaso  - No significant limitations with current activity level  Cough - Persistent cough managed with codeine  cough syrup, 5 mL at night  Medication management - Currently using Tyvaso  for pulmonary fibrosis - Previously on Ofev  - Pantoprazole  for acid management - Codeine  cough syrup for cough  Pulmonary monitoring - No recent laboratory work since February - Last CT scan and pulmonary function tests performed over a year ago  Pulmonary rehabilitation - Considering pulmonary rehabilitation but has not yet participated in a formal program  Recent travel - Returned from a four and a half month trip to [place] - Experienced jet lag after return   Relevant Pulmonary history: Pets: No pets, no birds Occupation: Retired Comptroller Exposures: No known exposures.  No mold, hot tub, Jacuzzi, no down pillows or comforter ILD questionnaire 01/12/2020-negative Smoking history: Used to smoke socially.  Quit many years ago Travel history: Originally from [place].  [place] and [place] between [place]  and [place] Relevant family history: No significant family history of lung disease  Outpatient Encounter Medications as of 07/16/2024  Medication Sig   budesonide -formoterol  (SYMBICORT ) 80-4.5 MCG/ACT  inhaler Inhale 2 puffs into the lungs 2 (two) times daily.   cholecalciferol  (VITAMIN D3) 25 MCG (1000 UNIT) tablet Take 1,000 Units by mouth daily.   clopidogrel  (PLAVIX ) 75 MG tablet Take 1 tablet (75 mg total) by mouth daily.   guaiFENesin -codeine  100-10 MG/5ML syrup  Take 5 mLs by mouth 3 (three) times daily as needed for cough.   levothyroxine  (SYNTHROID ) 50 MCG tablet Take 1 tablet (50 mcg total) by mouth daily.   losartan  (COZAAR ) 25 MG tablet Take 1 tablet (25 mg total) by mouth every evening.   metoprolol  succinate (TOPROL -XL) 50 MG 24 hr tablet Take 1 tablet (50 mg total) by mouth daily. Take with or immediately following a meal.   OFEV  150 MG CAPS TAKE 1 CAPSULE TWICE A DAY (APPOINTMENT NEEDED FOR FURTHER REFILLS)   pantoprazole  (PROTONIX ) 40 MG tablet TAKE 1 TABLET BY MOUTH DAILY   rosuvastatin  (CRESTOR ) 20 MG tablet Take 1 tablet (20 mg total) by mouth daily.   SOTYKTU  6 MG TABS TAKE 1 TABLET BY MOUTH 1 TIME A DAY.   Treprostinil  (TYVASO  DPI TITRATION KIT) 16 & 32 & 48 MCG POWD Inhale contents of 1 cartridge four tmies daily. Increase dose every 1-2 weeks as tolerated   albuterol  (PROVENTIL  HFA) 108 (90 Base) MCG/ACT inhaler Inhale 1-2 puffs into the lungs every 6 (six) hours as needed for wheezing or shortness of breath.   benzonatate  (TESSALON ) 200 MG capsule Take 1 capsule (200 mg total) by mouth 3 (three) times daily as needed for cough.   Deucravacitinib  (SOTYKTU ) 6 MG TABS Take 6 mg by mouth daily. (Patient not taking: Reported on 07/16/2024)   mupirocin  ointment (BACTROBAN ) 2 % Apply 1 Application topically 2 (two) times daily. Apply to forehead until healed (Patient not taking: Reported on 07/16/2024)   nystatin (MYCOSTATIN) 100000 UNIT/ML suspension Take 5 mLs by mouth 4 (four) times daily. (Patient not taking: Reported on 07/16/2024)   risankizumab -rzaa (SKYRIZI ) 150 MG/ML SOSY prefilled syringe Inject 1 mL (150 mg total) into the skin as directed. Every 12 weeks for maintenance. (Patient not taking: Reported on 07/16/2024)   No facility-administered encounter medications on file as of 07/16/2024.   Examination: Blood pressure 100/70, pulse 74, temperature 97.6 F (36.4 C), temperature source Oral, height 5' 6 (1.676 m), weight 141 lb (64 kg),  SpO2 96%. Gen:      No acute distress HEENT:  EOMI, sclera anicteric Neck:     No masses; no thyromegaly Lungs:    Bibasal crackles CV:         Regular rate and rhythm; no murmurs Abd:      + bowel sounds; soft, non-tender; no palpable masses, no distension Ext:    No edema; adequate peripheral perfusion Neuro: alert and oriented x 3 Psych: normal mood and affect   Data Reviewed: Imaging: High-res CT 11/19/2019-peripheral and basal subpleural reticulation with groundglass, traction bronchiectasis.  Probable honeycombing.  5 mm lung nodule.  Probable UIP pattern  CT abdomen 03/17/2020-stable pulmonary fibrosis at the base.  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  High-resolution CT 03/27/2023-progressive pattern of pulmonary fibrosis and probable UIP pattern.  I have reviewed the images personally  PFTs 03/18/2020 FVC 2.20 [60%], FEV1 1.65 [63%],/75, TLC 3.66 [58%], DLCO 12.0 [53%] Severe restriction, moderate-severe diffusion defect.  07/26/2021 FVC 1.48 [41%], FEV1 1.31 [50%], F/F 88, TLC 2.78 [44%], DLCO 10.37 [46%] Severe restriction, severe diffusion  defect.  Worsening compared to 2021  PFTs from [facility] 08/19/2021 FVC 1.53 [49%], FEV1 1.24 [51%], F/F 81, TLC 3.59 [56.7%], DLCO 11.45 [51.8%] Moderate obstruction with mild diffusion defect  6-minute walk 01/12/2020-408 m, nadir O2 sat of 92%  Labs: CBC 10/22 -WBC 10.4, eos 6%, absolute eosinophil count 624  CTD serologies 12/02/19-negative  Hepatic panel from [facility] 10/19/2021-within normal limits  Cardiac: Echocardiogram 07/25/2021 Normal LV systolic function with EF 72%, grade 1 diastolic dysfunction, PA pressures not estimated due to absence of TR.  Right heart catheterization 08/17/2023 RA pressure 0/-3, -4 mmHg.  RA saturation 67%.  RA saturation 67%. RV 35/1, EDP -1 mmHg. PA 35/12, mean 22 mmHg.  PA  saturation 70%. PW 4/6, mean 3 mmHg.  Aortic saturation 95%. Pulmonary vascular resistance 4.5 Wood units. 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 Currently on Ofev .  Tyvaso  in the end of 2024, which has improved stamina and exercise capacity. No significant changes in exertion ability. Last CT and PFTs were in April 2024, indicating the need for updated imaging and lung function tests. Discussed potential new IPF medications in development, which are still in testing phases and not yet FDA approved. Some promising treatments are in phase three or four trials, but he may have gastrointestinal side effects similar to current medications. - Order chest CT and pulmonary function tests (PFTs) at [facility] - Discuss potential new IPF medications in development  Cough associated with idiopathic pulmonary fibrosis and Tyvaso  therapy Cough persists, potentially due to both idiopathic pulmonary fibrosis and Tyvaso  therapy. Cough is more pronounced with exertion, such as climbing stairs. Currently managed with codeine  cough syrup prescribed by Dr. Golden. Discussed pulmonary rehabilitation options, including virtual rehab through [employer] application, which may be more feasible given scheduling constraints. Studies indicate virtual rehab is as effective as in-person sessions. - Continue codeine  cough syrup as prescribed - Discuss pulmonary rehabilitation options, including virtual rehab through [employer] application  Gastroesophageal reflux disease Gastroesophageal reflux disease managed with pantoprazole . - Continue pantoprazole     Plan/Recommendations: - Continue Ofev , Tyvaso  - Follow hepatic panel - Repeat CT, PFTs - Virtual pulmonary rehab  Lonna Coder MD [facility] 07/16/2024, 11:31 AM  CC: Rosan [place] BROCKS, DO

## 2024-07-16 NOTE — Addendum Note (Signed)
 Addended byBETHA THEOPHILUS ROOSEVELT on: 07/16/2024 04:25 PM   Modules accepted: Orders

## 2024-07-28 ENCOUNTER — Ambulatory Visit (HOSPITAL_BASED_OUTPATIENT_CLINIC_OR_DEPARTMENT_OTHER)

## 2024-07-28 ENCOUNTER — Other Ambulatory Visit: Payer: Self-pay | Admitting: Pulmonary Disease

## 2024-07-28 DIAGNOSIS — J84112 Idiopathic pulmonary fibrosis: Secondary | ICD-10-CM

## 2024-07-28 NOTE — Telephone Encounter (Signed)
 Specialty medication. Routing to [employer].

## 2024-07-28 NOTE — Telephone Encounter (Signed)
 Refill sent for OFEV  to [facility]: [phone]  Dose: 150mg  twice daily  Last OV: 07/16/2024 Provider: Dr. Theophilus Pertinent labs: LFTs on 07/16/2024 wnl  Next OV: not yet scheduled, due in Jan 2026  Sherry Pennant, PharmD, MPH, BCPS Clinical Pharmacist (Rheumatology and Pulmonology)

## 2024-08-01 ENCOUNTER — Other Ambulatory Visit: Payer: Self-pay | Admitting: Pulmonary Disease

## 2024-08-01 ENCOUNTER — Ambulatory Visit
Admission: RE | Admit: 2024-08-01 | Discharge: 2024-08-01 | Disposition: A | Discharge status: Home / Self Care | Source: Ambulatory Visit | Attending: Pulmonary Disease | Admitting: Pulmonary Disease

## 2024-08-01 DIAGNOSIS — J84112 Idiopathic pulmonary fibrosis: Secondary | ICD-10-CM | POA: Insufficient documentation

## 2024-08-01 DIAGNOSIS — R918 Other nonspecific abnormal finding of lung field: Secondary | ICD-10-CM | POA: Diagnosis not present

## 2024-08-01 DIAGNOSIS — Z5181 Encounter for therapeutic drug level monitoring: Secondary | ICD-10-CM | POA: Insufficient documentation

## 2024-08-01 DIAGNOSIS — J849 Interstitial pulmonary disease, unspecified: Secondary | ICD-10-CM | POA: Diagnosis not present

## 2024-08-01 DIAGNOSIS — I2723 Pulmonary hypertension due to lung diseases and hypoxia: Secondary | ICD-10-CM

## 2024-08-01 NOTE — Telephone Encounter (Signed)
 Refill sent for TYVASO  DPI to [facility]: [phone]  Dose: 64mcg inhaled four times daily  Last OV: 07/16/2024 Provider: Dr. Theophilus Pertinent labs: PFTs scheduled for 01/14/25  Next OV: not yet scheduled (Due in Jan 2026)  Sherry Pennant, PharmD, MPH, BCPS Clinical Pharmacist (Rheumatology and Pulmonology)

## 2024-08-01 NOTE — Telephone Encounter (Signed)
 Pt requesting refill of specialty medication. Routing to rx team.

## 2024-08-12 MED ORDER — TYVASO DPI MAINTENANCE KIT 64 MCG IN POWD: RESPIRATORY_TRACT | 12 refills | Status: AC

## 2024-08-12 NOTE — Addendum Note (Signed)
 Addended by: DAYNE SHERRY RAMAN on: 08/12/2024 02:51 PM   Modules accepted: Orders

## 2024-08-16 ENCOUNTER — Other Ambulatory Visit: Payer: Self-pay | Admitting: Internal Medicine

## 2024-08-16 DIAGNOSIS — I2510 Atherosclerotic heart disease of native coronary artery without angina pectoris: Secondary | ICD-10-CM

## 2024-08-16 DIAGNOSIS — Z8673 Personal history of transient ischemic attack (TIA), and cerebral infarction without residual deficits: Secondary | ICD-10-CM

## 2024-08-16 MED ORDER — LOSARTAN POTASSIUM 25 MG PO TABS: 25.0000 mg | ORAL_TABLET | Freq: Every evening | ORAL | 0 refills | Status: DC

## 2024-10-13 ENCOUNTER — Ambulatory Visit (INDEPENDENT_AMBULATORY_CARE_PROVIDER_SITE_OTHER): Payer: Self-pay | Admitting: Internal Medicine

## 2024-10-13 ENCOUNTER — Encounter: Payer: Self-pay | Admitting: Internal Medicine

## 2024-10-13 VITALS — BP 132/81 | HR 86 | Temp 97.5°F | Ht 66.0 in | Wt 138.2 lb

## 2024-10-13 DIAGNOSIS — E039 Hypothyroidism, unspecified: Secondary | ICD-10-CM

## 2024-10-13 DIAGNOSIS — Z8249 Family history of ischemic heart disease and other diseases of the circulatory system: Secondary | ICD-10-CM

## 2024-10-13 DIAGNOSIS — Z87891 Personal history of nicotine dependence: Secondary | ICD-10-CM

## 2024-10-13 DIAGNOSIS — J84112 Idiopathic pulmonary fibrosis: Secondary | ICD-10-CM

## 2024-10-13 DIAGNOSIS — H6121 Impacted cerumen, right ear: Secondary | ICD-10-CM | POA: Insufficient documentation

## 2024-10-13 DIAGNOSIS — I10 Essential (primary) hypertension: Secondary | ICD-10-CM

## 2024-10-13 DIAGNOSIS — Z79899 Other long term (current) drug therapy: Secondary | ICD-10-CM

## 2024-10-13 DIAGNOSIS — I2510 Atherosclerotic heart disease of native coronary artery without angina pectoris: Secondary | ICD-10-CM

## 2024-10-13 DIAGNOSIS — E119 Type 2 diabetes mellitus without complications: Secondary | ICD-10-CM

## 2024-10-13 DIAGNOSIS — Z9861 Coronary angioplasty status: Secondary | ICD-10-CM

## 2024-10-13 DIAGNOSIS — L308 Other specified dermatitis: Secondary | ICD-10-CM

## 2024-10-13 DIAGNOSIS — Z7951 Long term (current) use of inhaled steroids: Secondary | ICD-10-CM

## 2024-10-13 DIAGNOSIS — B351 Tinea unguium: Secondary | ICD-10-CM

## 2024-10-13 LAB — GLUCOSE, CAPILLARY: Glucose-Capillary: 107 mg/dL — ABNORMAL HIGH (ref 70–99)

## 2024-10-13 LAB — POCT GLYCOSYLATED HEMOGLOBIN (HGB A1C): HbA1c, POC (controlled diabetic range): 6 % (ref 0.0–7.0)

## 2024-10-13 NOTE — Assessment & Plan Note (Addendum)
 BP well controlled, on losartan  25mg  daily and metoprolol  succinate 50mg  daily. No significant orthostatic symptoms

## 2024-10-13 NOTE — Assessment & Plan Note (Addendum)
 Following with Dr Theophilus, last CT scan reassruing for no progressing remains of OFEV , symbicort 

## 2024-10-13 NOTE — Assessment & Plan Note (Addendum)
 Recheck TSH    Orders:    TSH

## 2024-10-13 NOTE — Assessment & Plan Note (Addendum)
 Remains on Plavix  and crestor , metoprolol  succinate, no chest pain, slgiht shortness of breath on climbing stairs likely due to IPF

## 2024-10-13 NOTE — Assessment & Plan Note (Signed)
 SABRA

## 2024-10-13 NOTE — Assessment & Plan Note (Addendum)
 Well controlled on Sotyku

## 2024-10-13 NOTE — Assessment & Plan Note (Addendum)
 Weight stable. Has not been needing pharmacologic therapy.   Orders:   POC Hbg A1C   Microalbumin / creatinine urine ratio   CBC with Diff   Lipid Profile   Ambulatory referral to Podiatry

## 2024-10-13 NOTE — Progress Notes (Unsigned)
 Subjective:  HPI: Chief Complaint  Patient presents with   Medical Management of Chronic Issues    Discussed the use of AI scribe software for clinical note transcription with the patient, who gave verbal consent to proceed.  History of Present Illness Nicholas Fleming is a 78 year old male who presents for a routine follow-up visit.  Psoriatic dermatitis is well controlled with no current rashes. He is taking Sotyktu  and has stopped using Skyrizi . He is not using Bactroban  cream.  Pulmonary hypertension is managed with Tyvaso , a powder inhaler, and Symbicort , two puffs in the morning and at night. He experiences shortness of breath when climbing stairs and uses albuterol  as needed.  Diabetes is well controlled with an A1c of 6.0. He is not experiencing any issues related to diabetes and has experienced some weight loss, which may have contributed to better glucose control.  He is taking several medications including clopidogrel , levothyroxine  50 micrograms daily, losartan  25 milligrams daily, metoprolol  50 milligrams daily, pantoprazole  40 milligrams daily, and rosuvastatin  20 milligrams daily. He is not currently using codeine  cough syrup.  He has a history of ear pain, which has improved, but there is a significant amount of earwax present. He also mentions thickened toenails, which bleed when cut.  He has received his flu and COVID vaccines. He recalls having a tetanus vaccine a long time ago, possibly in [place].     Please see Assessment and Plan below for the status of his chronic medical problems.  Objective:  Physical Exam: Vitals:   10/13/24 1013  TempSrc: Oral  Height: 5' 6 (1.676 m)   Body mass index is 22.76 kg/m. Physical Exam Constitutional:      Appearance: Normal appearance.  HENT:     Right Ear: There is impacted cerumen.     Left Ear: Tympanic membrane and ear canal normal.  Cardiovascular:     Rate and Rhythm: Normal rate and regular rhythm.      Pulses:          Dorsalis pedis pulses are 2+ on the right side and 2+ on the left side.       Posterior tibial pulses are 2+ on the right side and 2+ on the left side.  Pulmonary:     Effort: Pulmonary effort is normal. No tachypnea or respiratory distress.     Comments: Bibasilar fine crackles Skin:    Comments: Thickened and discolored toenails bilaterally, well trimmed, no active bleeding, redness or warmth  Neurological:     Mental Status: He is alert.    Results LABS A1c: 6.0 (10/13/2024) Comprehensive Metabolic Panel: normal (06/2024)  RADIOLOGY Chest CT: no progression  DIAGNOSTIC Right Heart Catheterization: mild pulmonary hypertension (2024)  PROCEDURE Ear Irrigation: Significant cerumen observed in the ear canal. Cerumen was removed. No results found for any visits on 10/13/24.  The ASCVD Risk score (Arnett DK, et al., 2019) failed to calculate for the following reasons:   Risk score cannot be calculated because patient has a medical history suggesting prior/existing ASCVD  Assessment & Plan:  See Encounters Tab for problem based charting. Assessment & Plan Controlled type 2 diabetes mellitus (HCC) Weight stable. Has not been needing pharmacologic therapy.   Orders:   POC Hbg A1C   Microalbumin / creatinine urine ratio   CBC with Diff   Lipid Profile   Ambulatory referral to Podiatry  Essential hypertension BP well controlled, on losartan  25mg  daily and metoprolol  succinate 50mg  daily. 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  Discussed that I would want to avoid any systemic treatment due to concern may interfere with his other medication but topical treatment may work, will send to podiatry. Orders:   Ambulatory referral to Podiatry  Impacted cerumen of right ear       Medications Ordered No orders of the defined types were placed in this encounter.  Other Orders Orders Placed This Encounter  Procedures   POC Hbg A1C   Follow Up: No follow-ups on file.

## 2024-10-14 LAB — CBC WITH DIFFERENTIAL/PLATELET
Basophils Absolute: 0.1 x10E3/uL (ref 0.0–0.2)
Basos: 1 %
EOS (ABSOLUTE): 0.6 x10E3/uL — ABNORMAL HIGH (ref 0.0–0.4)
Eos: 6 %
Hematocrit: 51 % (ref 37.5–51.0)
Hemoglobin: 16.3 g/dL (ref 13.0–17.7)
Immature Grans (Abs): 0 x10E3/uL (ref 0.0–0.1)
Immature Granulocytes: 0 %
Lymphocytes Absolute: 2.1 x10E3/uL (ref 0.7–3.1)
Lymphs: 21 %
MCH: 32.3 pg (ref 26.6–33.0)
MCHC: 32 g/dL (ref 31.5–35.7)
MCV: 101 fL — ABNORMAL HIGH (ref 79–97)
Monocytes Absolute: 0.7 x10E3/uL (ref 0.1–0.9)
Monocytes: 7 %
Neutrophils Absolute: 6.6 x10E3/uL (ref 1.4–7.0)
Neutrophils: 65 %
Platelets: 241 x10E3/uL (ref 150–450)
RBC: 5.05 x10E6/uL (ref 4.14–5.80)
RDW: 14 % (ref 11.6–15.4)
WBC: 10.1 x10E3/uL (ref 3.4–10.8)

## 2024-10-14 LAB — MICROALBUMIN / CREATININE URINE RATIO
Creatinine, Urine: 46.6 mg/dL
Microalb/Creat Ratio: 84 mg/g{creat} — ABNORMAL HIGH (ref 0–29)
Microalbumin, Urine: 39 ug/mL

## 2024-10-14 LAB — LIPID PANEL
Chol/HDL Ratio: 2.1 ratio (ref 0.0–5.0)
Cholesterol, Total: 135 mg/dL (ref 100–199)
HDL: 64 mg/dL (ref >39)
LDL Chol Calc (NIH): 55 mg/dL (ref 0–99)
Triglycerides: 85 mg/dL (ref 0–149)
VLDL Cholesterol Cal: 16 mg/dL (ref 5–40)

## 2024-10-14 LAB — TSH: TSH: 2.76 u[IU]/mL (ref 0.450–4.500)

## 2024-10-15 ENCOUNTER — Ambulatory Visit: Payer: Self-pay | Admitting: Internal Medicine

## 2024-10-15 ENCOUNTER — Other Ambulatory Visit: Payer: Self-pay | Admitting: Dermatology

## 2024-10-15 DIAGNOSIS — L409 Psoriasis, unspecified: Secondary | ICD-10-CM

## 2024-11-07 ENCOUNTER — Encounter: Payer: Self-pay | Admitting: Internal Medicine

## 2024-11-07 DIAGNOSIS — I2510 Atherosclerotic heart disease of native coronary artery without angina pectoris: Secondary | ICD-10-CM

## 2024-11-07 DIAGNOSIS — Z8673 Personal history of transient ischemic attack (TIA), and cerebral infarction without residual deficits: Secondary | ICD-10-CM

## 2024-11-07 MED ORDER — LOSARTAN POTASSIUM 25 MG PO TABS: 25.0000 mg | ORAL_TABLET | Freq: Every evening | ORAL | 3 refills | Status: AC

## 2024-11-27 ENCOUNTER — Encounter: Payer: Self-pay | Admitting: Dermatology

## 2024-11-27 ENCOUNTER — Ambulatory Visit: Admitting: Dermatology

## 2024-11-27 DIAGNOSIS — L409 Psoriasis, unspecified: Secondary | ICD-10-CM | POA: Diagnosis not present

## 2024-11-27 DIAGNOSIS — Z79899 Other long term (current) drug therapy: Secondary | ICD-10-CM | POA: Diagnosis not present

## 2024-11-27 DIAGNOSIS — Z7189 Other specified counseling: Secondary | ICD-10-CM

## 2024-11-27 NOTE — Patient Instructions (Signed)

## 2024-11-27 NOTE — Progress Notes (Signed)
° °  Follow-Up Visit   Subjective  Nicholas Fleming is a 78 y.o. male who presents for the following: Psoriasis Patient here today for follow up on psoriasis. Patient currently taking Sotyku 6 mg tablet daily with a good response, patient report no problems or concerns.    Daughter in law present with him.  The following portions of the chart were reviewed this encounter and updated as appropriate: medications, allergies, medical history  Review of Systems:  No other skin or systemic complaints except as noted in HPI or Assessment and Plan.  Objective  Well appearing patient in no apparent distress; mood and affect are within normal limits.  Areas Examined: Face, knees, elbows and hands   Relevant exam findings are noted in the Assessment and Plan.   Assessment & Plan  PSORIASIS on systemic treatment Started Sotyktu  ~ January 2024  Exam: macular hyperpigmentation with no scale on areas with previous psoriasis at b/l knees, b/l elbows, and scalp  0% BSA improved on Sotyktu   Chronic condition with duration or expected duration over one year. Currently well-controlled. 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  Psoriasis is a chronic non-curable, but treatable genetic/hereditary disease that may have other systemic features affecting other organ systems such as joints (Psoriatic Arthritis).  It is linked with heart disease, inflammatory bowel disease, non-alcoholic fatty liver disease, and depression. Significant skin psoriasis and/or psoriatic arthritis may have significant symptoms and affects activities of daily activity and often benefits from systemic treatments.  These systemic treatments have some potential side effects including immunosuppression and require pre-treatment laboratory screening and periodic laboratory monitoring and periodic in person evaluation and monitoring by the attending  dermatologist physician (long term medication management).   Labs reviewed from 10/13/24 CBC with diff, Lipid and TSH-okay results    Plan on CMP, CBC with Diff, and Lipid Panel at follow up visit    Lab ordered Tb Quantiferon Gold  Last shingrix vaccine 08/18/2021 Treatment Plan: Continue Sotyktu  6 mg tab - take 1 tab by mouth daily   Reviewed risks of biologics including immunosuppression, infections, injection site reaction, and failure to improve condition. Goal is control of skin condition, not cure.  Some older biologics such as Humira and Enbrel may slightly increase risk of malignancy and may worsen congestive heart failure.  Taltz and Cosentyx may cause inflammatory bowel disease to flare. The use of biologics requires long term medication management, including periodic office visits and monitoring of blood work.    Long term medication management.  Patient is using long term (months to years) prescription medication  to control their dermatologic condition.  These medications require periodic monitoring to evaluate for efficacy and side effects and may require periodic laboratory monitoring.  PSORIASIS   This Visit - QuantiFERON-TB Gold Plus Existing Treatments - SOTYKTU  6 MG TABS - TAKE 1 TABLET BY MOUTH 1 TIME A DAY. - SOTYKTU  6 MG TABS - TAKE 1 TABLET BY MOUTH 1 TIME A DAY COUNSELING AND COORDINATION OF CARE   MEDICATION MANAGEMENT   LONG-TERM USE OF HIGH-RISK MEDICATION    Return in about 6 months (around 05/28/2025) for Psoriasis . Plan TBSE on fu.  I, Fay Kirks, CMA, am acting as scribe for Alm Rhyme, MD .   Documentation: I have reviewed the above documentation for accuracy and completeness, and I agree with the above.  Alm Rhyme, MD

## 2024-11-30 LAB — QUANTIFERON-TB GOLD PLUS
QuantiFERON Mitogen Value: >10 [IU]/mL
QuantiFERON Nil Value: 0.05 [IU]/mL
QuantiFERON TB1 Ag Value: 0.05 [IU]/mL
QuantiFERON TB2 Ag Value: 0.04 [IU]/mL
QuantiFERON-TB Gold Plus: NEGATIVE

## 2024-12-01 ENCOUNTER — Ambulatory Visit: Payer: Self-pay | Admitting: Dermatology

## 2024-12-02 ENCOUNTER — Other Ambulatory Visit: Payer: Self-pay

## 2024-12-02 DIAGNOSIS — L409 Psoriasis, unspecified: Secondary | ICD-10-CM

## 2024-12-02 MED ORDER — SOTYKTU 6 MG PO TABS: 6.0000 mg | ORAL_TABLET | Freq: Every day | ORAL | 6 refills | Status: AC

## 2024-12-02 NOTE — Telephone Encounter (Signed)
 Sotyktu  6 mg tablets 6 refills sent to [facility]

## 2024-12-02 NOTE — Telephone Encounter (Signed)
 Discussed labs with patient continue Sotyktu 

## 2024-12-02 NOTE — Telephone Encounter (Signed)
-----   Message from Alm Rhyme, MD sent at 12/01/2024  5:57 PM EST ----- TB test / Quantiferon Gold from 11/27/2024 was  Negative = Normal  Pt may continue Sotyktu . May send 6 mos refills

## 2024-12-04 ENCOUNTER — Ambulatory Visit: Admitting: Podiatry

## 2024-12-04 DIAGNOSIS — Z0189 Encounter for other specified special examinations: Secondary | ICD-10-CM

## 2024-12-04 DIAGNOSIS — E119 Type 2 diabetes mellitus without complications: Secondary | ICD-10-CM | POA: Diagnosis not present

## 2024-12-04 MED ORDER — AMMONIUM LACTATE 12 % EX LOTN: 1.0000 | TOPICAL_LOTION | CUTANEOUS | 0 refills | Status: AC | PRN

## 2024-12-04 NOTE — Progress Notes (Unsigned)
Foot exam normal

## 2024-12-31 ENCOUNTER — Other Ambulatory Visit (HOSPITAL_COMMUNITY): Payer: Self-pay

## 2024-12-31 ENCOUNTER — Telehealth: Payer: Self-pay | Admitting: Internal Medicine

## 2024-12-31 MED ORDER — GUAIFENESIN-CODEINE 100-10 MG/5ML PO SOLN: 5.0000 mL | Freq: Three times a day (TID) | ORAL | 1 refills | Status: AC | PRN

## 2024-12-31 NOTE — Telephone Encounter (Signed)
 Has viral URI w/ significant cough, requesting refill of codeine  cough syrup.

## 2025-01-07 ENCOUNTER — Other Ambulatory Visit: Payer: Self-pay | Admitting: Internal Medicine

## 2025-01-07 MED ORDER — CLOPIDOGREL BISULFATE 75 MG PO TABS: 75.0000 mg | ORAL_TABLET | Freq: Every day | ORAL | 4 refills | Status: AC

## 2025-01-07 NOTE — Progress Notes (Signed)
Refill of plavix

## 2025-01-12 ENCOUNTER — Telehealth: Payer: Self-pay

## 2025-01-12 NOTE — Telephone Encounter (Signed)
 Submitted a Prior Authorization request to [facility] for TYVASO  DPI via CoverMyMeds. Will update once we receive a response.  Key: [id number]

## 2025-01-12 NOTE — Telephone Encounter (Signed)
 Received notification from [facility] regarding a prior authorization for TYVASO  DPI. Authorization has been APPROVED from 12/18/2024 to 12/17/2025. Approval letter sent to scan center.  Authorization # [id number]

## 2025-01-14 ENCOUNTER — Encounter

## 2025-01-19 ENCOUNTER — Other Ambulatory Visit: Payer: Self-pay | Admitting: Pulmonary Disease

## 2025-01-19 ENCOUNTER — Encounter

## 2025-01-19 DIAGNOSIS — J84112 Idiopathic pulmonary fibrosis: Secondary | ICD-10-CM

## 2025-01-22 NOTE — Telephone Encounter (Signed)
 Refill sent for OFEV  to [facility] (pulmonary fibrosis team). [phone]  Dose: 150mg  twice daily   Last OV: 07/16/24 Provider: Dr. Theophilus Pertinent labs: LFTs wnl 07/16/24  Next OV: overdue, due around 01/16/25  Routing to scheduling team for follow-up on appt scheduling  Nicholas Fleming, PharmD, BCPS Clinical Pharmacist  [facility]

## 2025-02-11 ENCOUNTER — Ambulatory Visit: Admitting: Pulmonary Disease

## 2025-03-05 ENCOUNTER — Ambulatory Visit: Admitting: Podiatry

## 2025-05-28 ENCOUNTER — Ambulatory Visit: Admitting: Dermatology
